# Patient Record
Sex: Male | Born: 1945
Health system: Southern US, Community
[De-identification: ages and names within clinical notes are randomized; demographics above are authoritative.]

## PROBLEM LIST (undated history)

## (undated) DIAGNOSIS — D509 Iron deficiency anemia, unspecified: Secondary | ICD-10-CM

## (undated) DIAGNOSIS — I77810 Thoracic aortic ectasia: Secondary | ICD-10-CM

## (undated) DIAGNOSIS — I351 Nonrheumatic aortic (valve) insufficiency: Secondary | ICD-10-CM

## (undated) DIAGNOSIS — F319 Bipolar disorder, unspecified: Secondary | ICD-10-CM

## (undated) DIAGNOSIS — Q2381 Bicuspid aortic valve: Secondary | ICD-10-CM

## (undated) DIAGNOSIS — I639 Cerebral infarction, unspecified: Secondary | ICD-10-CM

## (undated) DIAGNOSIS — Z9289 Personal history of other medical treatment: Secondary | ICD-10-CM

## (undated) DIAGNOSIS — Q231 Congenital insufficiency of aortic valve: Secondary | ICD-10-CM

## (undated) DIAGNOSIS — C801 Malignant (primary) neoplasm, unspecified: Secondary | ICD-10-CM

## (undated) DIAGNOSIS — I10 Essential (primary) hypertension: Secondary | ICD-10-CM

## (undated) DIAGNOSIS — F32A Depression, unspecified: Secondary | ICD-10-CM

## (undated) DIAGNOSIS — K5732 Diverticulitis of large intestine without perforation or abscess without bleeding: Secondary | ICD-10-CM

## (undated) DIAGNOSIS — M1990 Unspecified osteoarthritis, unspecified site: Secondary | ICD-10-CM

## (undated) DIAGNOSIS — E785 Hyperlipidemia, unspecified: Secondary | ICD-10-CM

## (undated) DIAGNOSIS — K219 Gastro-esophageal reflux disease without esophagitis: Secondary | ICD-10-CM

## (undated) DIAGNOSIS — I77819 Aortic ectasia, unspecified site: Secondary | ICD-10-CM

## (undated) HISTORY — DX: Essential (primary) hypertension: I10

## (undated) HISTORY — DX: Congenital insufficiency of aortic valve: Q23.1

## (undated) HISTORY — DX: Iron deficiency anemia, unspecified: D50.9

## (undated) HISTORY — DX: Personal history of other medical treatment: Z92.89

## (undated) HISTORY — DX: Gastro-esophageal reflux disease without esophagitis: K21.9

## (undated) HISTORY — DX: Diverticulitis of large intestine without perforation or abscess without bleeding: K57.32

## (undated) HISTORY — DX: Thoracic aortic ectasia: I77.810

## (undated) HISTORY — DX: Hyperlipidemia, unspecified: E78.5

## (undated) HISTORY — DX: Nonrheumatic aortic (valve) insufficiency: I35.1

## (undated) HISTORY — DX: Malignant (primary) neoplasm, unspecified: C80.1

## (undated) HISTORY — DX: Bipolar disorder, unspecified: F31.9

## (undated) HISTORY — DX: Cerebral infarction, unspecified: I63.9

## (undated) HISTORY — DX: Aortic ectasia, unspecified site: I77.819

## (undated) HISTORY — PX: HERNIA REPAIR: SHX51

## (undated) HISTORY — DX: Bicuspid aortic valve: Q23.81

## (undated) HISTORY — PX: ORTHOPEDIC SURGERY: SHX850

## (undated) HISTORY — DX: Depression, unspecified: F32.A

## (undated) HISTORY — PX: EYE SURGERY: SHX253

---

## 2004-11-20 ENCOUNTER — Ambulatory Visit: Payer: Self-pay

## 2005-03-26 ENCOUNTER — Ambulatory Visit: Payer: Self-pay | Admitting: Ophthalmology

## 2005-05-02 ENCOUNTER — Other Ambulatory Visit: Payer: Self-pay

## 2005-05-03 ENCOUNTER — Observation Stay: Payer: Self-pay

## 2005-05-03 ENCOUNTER — Inpatient Hospital Stay: Payer: Self-pay | Admitting: Unknown Physician Specialty

## 2005-09-11 ENCOUNTER — Ambulatory Visit: Payer: Self-pay

## 2006-05-03 ENCOUNTER — Emergency Department: Payer: Self-pay | Admitting: Emergency Medicine

## 2010-04-09 ENCOUNTER — Ambulatory Visit: Payer: Self-pay | Admitting: Ophthalmology

## 2011-07-09 ENCOUNTER — Ambulatory Visit: Payer: Self-pay | Admitting: Nephrology

## 2012-06-09 ENCOUNTER — Ambulatory Visit: Payer: Self-pay | Admitting: Gastroenterology

## 2012-06-09 LAB — HM COLONOSCOPY

## 2012-06-11 LAB — PATHOLOGY REPORT

## 2015-03-15 DIAGNOSIS — N183 Chronic kidney disease, stage 3 (moderate): Secondary | ICD-10-CM | POA: Diagnosis not present

## 2015-03-15 DIAGNOSIS — Z0000 Encounter for general adult medical examination without abnormal findings: Secondary | ICD-10-CM | POA: Diagnosis not present

## 2015-03-15 DIAGNOSIS — I10 Essential (primary) hypertension: Secondary | ICD-10-CM | POA: Diagnosis not present

## 2015-03-15 DIAGNOSIS — Z23 Encounter for immunization: Secondary | ICD-10-CM | POA: Diagnosis not present

## 2015-03-15 DIAGNOSIS — K5730 Diverticulosis of large intestine without perforation or abscess without bleeding: Secondary | ICD-10-CM | POA: Diagnosis not present

## 2015-03-15 DIAGNOSIS — K219 Gastro-esophageal reflux disease without esophagitis: Secondary | ICD-10-CM | POA: Diagnosis not present

## 2015-03-15 DIAGNOSIS — E782 Mixed hyperlipidemia: Secondary | ICD-10-CM | POA: Diagnosis not present

## 2015-03-15 LAB — PSA

## 2015-09-27 ENCOUNTER — Ambulatory Visit (INDEPENDENT_AMBULATORY_CARE_PROVIDER_SITE_OTHER): Payer: Medicare Other | Admitting: Family Medicine

## 2015-09-27 ENCOUNTER — Encounter: Payer: Self-pay | Admitting: Family Medicine

## 2015-09-27 VITALS — BP 121/77 | HR 69 | Temp 98.6°F | Ht 67.5 in | Wt 175.4 lb

## 2015-09-27 DIAGNOSIS — E785 Hyperlipidemia, unspecified: Secondary | ICD-10-CM | POA: Diagnosis not present

## 2015-09-27 DIAGNOSIS — F3170 Bipolar disorder, currently in remission, most recent episode unspecified: Secondary | ICD-10-CM | POA: Diagnosis not present

## 2015-09-27 DIAGNOSIS — F3181 Bipolar II disorder: Secondary | ICD-10-CM | POA: Insufficient documentation

## 2015-09-27 DIAGNOSIS — I129 Hypertensive chronic kidney disease with stage 1 through stage 4 chronic kidney disease, or unspecified chronic kidney disease: Secondary | ICD-10-CM | POA: Insufficient documentation

## 2015-09-27 DIAGNOSIS — I10 Essential (primary) hypertension: Secondary | ICD-10-CM | POA: Diagnosis not present

## 2015-09-27 DIAGNOSIS — N183 Chronic kidney disease, stage 3 unspecified: Secondary | ICD-10-CM

## 2015-09-27 DIAGNOSIS — Z23 Encounter for immunization: Secondary | ICD-10-CM | POA: Diagnosis not present

## 2015-09-27 DIAGNOSIS — I639 Cerebral infarction, unspecified: Secondary | ICD-10-CM | POA: Insufficient documentation

## 2015-09-27 DIAGNOSIS — Z8673 Personal history of transient ischemic attack (TIA), and cerebral infarction without residual deficits: Secondary | ICD-10-CM | POA: Insufficient documentation

## 2015-09-27 DIAGNOSIS — F319 Bipolar disorder, unspecified: Secondary | ICD-10-CM | POA: Insufficient documentation

## 2015-09-27 MED ORDER — RISPERIDONE 2 MG PO TABS: 2.0000 mg | ORAL_TABLET | Freq: Every day | ORAL | Status: DC

## 2015-09-27 MED ORDER — CHOLINE FENOFIBRATE 135 MG PO CPDR: 135.0000 mg | DELAYED_RELEASE_CAPSULE | Freq: Every day | ORAL | Status: DC

## 2015-09-27 MED ORDER — LISINOPRIL 20 MG PO TABS: 20.0000 mg | ORAL_TABLET | Freq: Every day | ORAL | Status: DC

## 2015-09-27 MED ORDER — DULOXETINE HCL 30 MG PO CPEP: 30.0000 mg | ORAL_CAPSULE | Freq: Every day | ORAL | Status: DC

## 2015-09-27 MED ORDER — BUPROPION HCL ER (XL) 300 MG PO TB24: 300.0000 mg | ORAL_TABLET | Freq: Every day | ORAL | Status: DC

## 2015-09-27 MED ORDER — DIVALPROEX SODIUM ER 250 MG PO TB24: 250.0000 mg | ORAL_TABLET | Freq: Two times a day (BID) | ORAL | Status: DC

## 2015-09-27 NOTE — Progress Notes (Signed)
BP 121/77 mmHg  Pulse 69  Temp(Src) 98.6 F (37 C)  Ht 5' 7.5" (1.715 m)  Wt 175 lb 6.4 oz (79.561 kg)  BMI 27.05 kg/m2  SpO2 95%   Subjective:    Patient ID: Dean Larsen, male    DOB: 08/21/1946, 69 y.o.   MRN: [id number]  HPI: Dean Larsen is a 69 y.o. male  Chief Complaint  Patient presents with  . Follow-up   patient accompanied by daughter All in all doing well no complaints from medication which takes faithfully Blood pressure well controlled Stroke no further symptoms Bipolar stable on medications with no change in symptoms over the last 6 months Cholesterol triglycerides doing well no complaints from medications CK D stable not taking kidney toxic agents.  Relevant past medical, surgical, family and social history reviewed and updated as indicated. Interim medical history since our last visit reviewed. Allergies and medications reviewed and updated.  Review of Systems  Constitutional: Negative.   Respiratory: Negative.   Cardiovascular: Negative.     Per HPI unless specifically indicated above     Objective:    BP 121/77 mmHg  Pulse 69  Temp(Src) 98.6 F (37 C)  Ht 5' 7.5" (1.715 m)  Wt 175 lb 6.4 oz (79.561 kg)  BMI 27.05 kg/m2  SpO2 95%  Wt Readings from Last 3 Encounters:  09/27/15 175 lb 6.4 oz (79.561 kg)  03/15/15 176 lb (79.833 kg)    Physical Exam  Constitutional: He is oriented to person, place, and time. He appears well-developed and well-nourished. No distress.  HENT:  Head: Normocephalic and atraumatic.  Right Ear: Hearing normal.  Left Ear: Hearing normal.  Nose: Nose normal.  Eyes: Conjunctivae and lids are normal. Right eye exhibits no discharge. Left eye exhibits no discharge. No scleral icterus.  Cardiovascular: Normal rate, regular rhythm and normal heart sounds.   Pulmonary/Chest: Effort normal and breath sounds normal. No respiratory distress.  Musculoskeletal: Normal range of motion.  Neurological: He is alert and  oriented to person, place, and time.  Skin: Skin is intact. No rash noted.  Psychiatric: He has a normal mood and affect. His speech is normal and behavior is normal. Judgment and thought content normal. Cognition and memory are normal.    Results for orders placed or performed in visit on 09/25/15  PSA  Result Value Ref Range   PSA PP   HM COLONOSCOPY  Result Value Ref Range   HM Colonoscopy PP       Assessment & Plan:   Problem List Items Addressed This Visit      Cardiovascular and Mediastinum   Essential hypertension   Relevant Medications   lisinopril (PRINIVIL,ZESTRIL) 20 MG tablet   Choline Fenofibrate (TRILIPIX) 135 MG capsule   CVA (cerebral vascular accident)   Relevant Medications   lisinopril (PRINIVIL,ZESTRIL) 20 MG tablet   Choline Fenofibrate (TRILIPIX) 135 MG capsule     Genitourinary   Hypertensive kidney disease with CKD stage III   Relevant Medications   lisinopril (PRINIVIL,ZESTRIL) 20 MG tablet   Other Relevant Orders   Basic metabolic panel     Other   Bipolar disorder - Primary   Relevant Medications   risperiDONE (RISPERDAL) 2 MG tablet   DULoxetine (CYMBALTA) 30 MG capsule   divalproex (DEPAKOTE ER) 250 MG 24 hr tablet   buPROPion (WELLBUTRIN XL) 300 MG 24 hr tablet   Hyperlipidemia    The current medical regimen is effective;  continue present plan and medications.  Relevant Medications   lisinopril (PRINIVIL,ZESTRIL) 20 MG tablet   Choline Fenofibrate (TRILIPIX) 135 MG capsule   Other Relevant Orders   Lipid Panel w/o Chol/HDL Ratio   ALT   AST    Other Visit Diagnoses    Need for influenza vaccination        Relevant Orders    Flu Vaccine QUAD 36+ mos PF IM (Fluarix & Fluzone Quad PF) (Completed)        Follow up plan: Return in about 6 months (around 03/26/2016), or if symptoms worsen or fail to improve, for Physical Exam.

## 2015-09-27 NOTE — Assessment & Plan Note (Signed)
The current medical regimen is effective;  continue present plan and medications.  

## 2015-09-28 LAB — BASIC METABOLIC PANEL
BUN / CREAT RATIO: 15 (ref 10–22)
BUN: 26 mg/dL (ref 8–27)
CALCIUM: 9.6 mg/dL (ref 8.6–10.2)
CO2: 22 mmol/L (ref 18–29)
Chloride: 101 mmol/L (ref 97–108)
Creatinine, Ser: 1.79 mg/dL — ABNORMAL HIGH (ref 0.76–1.27)
GFR, EST AFRICAN AMERICAN: 43 mL/min/{1.73_m2} — AB (ref >59)
GFR, EST NON AFRICAN AMERICAN: 38 mL/min/{1.73_m2} — AB (ref >59)
Glucose: 95 mg/dL (ref 65–99)
Potassium: 5.2 mmol/L (ref 3.5–5.2)
Sodium: 141 mmol/L (ref 134–144)

## 2015-09-28 LAB — LIPID PANEL W/O CHOL/HDL RATIO
CHOLESTEROL TOTAL: 224 mg/dL — AB (ref 100–199)
HDL: 42 mg/dL (ref >39)
LDL Calculated: 149 mg/dL — ABNORMAL HIGH (ref 0–99)
TRIGLYCERIDES: 167 mg/dL — AB (ref 0–149)
VLDL Cholesterol Cal: 33 mg/dL (ref 5–40)

## 2015-09-28 LAB — ALT: ALT: 36 IU/L (ref 0–44)

## 2015-09-28 LAB — AST: AST: 29 IU/L (ref 0–40)

## 2015-10-01 ENCOUNTER — Telehealth: Payer: Self-pay | Admitting: Family Medicine

## 2015-10-01 NOTE — Telephone Encounter (Signed)
Phone call discussed with patient's daughter is patient hard of hearing Patient with CK D and elevated cholesterol reviewed with daughter who knows patient not interested in doing further intervention We will try to do better diet avoid nephrotoxic agents.

## 2015-10-01 NOTE — Telephone Encounter (Signed)
-----   Message from Wynn Maudlin, [place] sent at 10/01/2015 12:19 PM EDT ----- Daughter Anderson Malta

## 2016-03-18 ENCOUNTER — Encounter: Payer: Medicare Other | Admitting: Family Medicine

## 2016-04-16 ENCOUNTER — Encounter: Payer: Self-pay | Admitting: Family Medicine

## 2016-04-16 ENCOUNTER — Ambulatory Visit (INDEPENDENT_AMBULATORY_CARE_PROVIDER_SITE_OTHER): Payer: Medicare Other | Admitting: Family Medicine

## 2016-04-16 VITALS — BP 129/64 | HR 61 | Temp 97.5°F | Ht 64.3 in | Wt 173.0 lb

## 2016-04-16 DIAGNOSIS — E785 Hyperlipidemia, unspecified: Secondary | ICD-10-CM

## 2016-04-16 DIAGNOSIS — K222 Esophageal obstruction: Secondary | ICD-10-CM | POA: Diagnosis not present

## 2016-04-16 DIAGNOSIS — I10 Essential (primary) hypertension: Secondary | ICD-10-CM

## 2016-04-16 DIAGNOSIS — Z0000 Encounter for general adult medical examination without abnormal findings: Secondary | ICD-10-CM

## 2016-04-16 DIAGNOSIS — F3170 Bipolar disorder, currently in remission, most recent episode unspecified: Secondary | ICD-10-CM

## 2016-04-16 DIAGNOSIS — I129 Hypertensive chronic kidney disease with stage 1 through stage 4 chronic kidney disease, or unspecified chronic kidney disease: Secondary | ICD-10-CM

## 2016-04-16 DIAGNOSIS — Z23 Encounter for immunization: Secondary | ICD-10-CM | POA: Diagnosis not present

## 2016-04-16 DIAGNOSIS — N183 Chronic kidney disease, stage 3 (moderate): Secondary | ICD-10-CM | POA: Diagnosis not present

## 2016-04-16 LAB — URINALYSIS, ROUTINE W REFLEX MICROSCOPIC
BILIRUBIN UA: NEGATIVE
GLUCOSE, UA: NEGATIVE
KETONES UA: NEGATIVE
Leukocytes, UA: NEGATIVE
Nitrite, UA: NEGATIVE
PROTEIN UA: NEGATIVE
RBC, UA: NEGATIVE
Specific Gravity, UA: 1.02 (ref 1.005–1.030)
UUROB: 1 mg/dL (ref 0.2–1.0)
pH, UA: 6 (ref 5.0–7.5)

## 2016-04-16 MED ORDER — DULOXETINE HCL 30 MG PO CPEP: 30.0000 mg | ORAL_CAPSULE | Freq: Every day | ORAL | Status: DC

## 2016-04-16 MED ORDER — BUPROPION HCL ER (XL) 300 MG PO TB24: 300.0000 mg | ORAL_TABLET | Freq: Every day | ORAL | Status: DC

## 2016-04-16 MED ORDER — LISINOPRIL 20 MG PO TABS
20.0000 mg | ORAL_TABLET | Freq: Every day | ORAL | Status: DC
Start: 2016-04-16 — End: 2017-04-06

## 2016-04-16 MED ORDER — CHOLINE FENOFIBRATE 135 MG PO CPDR: 135.0000 mg | DELAYED_RELEASE_CAPSULE | Freq: Every day | ORAL | Status: DC

## 2016-04-16 MED ORDER — RISPERIDONE 2 MG PO TABS: 2.0000 mg | ORAL_TABLET | Freq: Every day | ORAL | Status: DC

## 2016-04-16 MED ORDER — DIVALPROEX SODIUM ER 250 MG PO TB24: 250.0000 mg | ORAL_TABLET | Freq: Two times a day (BID) | ORAL | Status: DC

## 2016-04-16 NOTE — Progress Notes (Signed)
BP 129/64 mmHg  Pulse 61  Temp(Src) 97.5 F (36.4 C)  Ht 5' 4.3" (1.633 m)  Wt 173 lb (78.472 kg)  BMI 29.43 kg/m2  SpO2 99%   Subjective:    Patient ID: Dean Larsen, male    DOB: 01/20/1946, 70 y.o.   MRN: [id number]  HPI: Dean Larsen is a 70 y.o. male  Chief Complaint  Patient presents with  . Annual Exam   Patient all in all doing well accompanied by his daughter who assists with history nerves doing well on medications. No problems with sleeping Does have some headache that comes on responds well to Tylenol  Cholesterol medicine doing well with no side effects or issues Blood pressure good control   Relevant past medical, surgical, family and social history reviewed and updated as indicated. Interim medical history since our last visit reviewed. Allergies and medications reviewed and updated.  Review of Systems  Constitutional: Negative.   HENT: Negative.   Eyes: Negative.   Respiratory: Negative.   Cardiovascular: Negative.   Gastrointestinal: Negative.        Patient has esophageal stenosis problems has been to GI for this having exacerbation of symptoms will need to go back.  Endocrine: Negative.   Genitourinary: Negative.   Musculoskeletal: Negative.   Skin: Negative.   Allergic/Immunologic: Negative.   Neurological: Negative.   Hematological: Negative.   Psychiatric/Behavioral: Negative.     Per HPI unless specifically indicated above     Objective:    BP 129/64 mmHg  Pulse 61  Temp(Src) 97.5 F (36.4 C)  Ht 5' 4.3" (1.633 m)  Wt 173 lb (78.472 kg)  BMI 29.43 kg/m2  SpO2 99%  Wt Readings from Last 3 Encounters:  04/16/16 173 lb (78.472 kg)  09/27/15 175 lb 6.4 oz (79.561 kg)  03/15/15 176 lb (79.833 kg)    Physical Exam  Constitutional: He is oriented to person, place, and time. He appears well-developed and well-nourished.  HENT:  Head: Normocephalic and atraumatic.  Right Ear: External ear normal.  Left Ear: External ear  normal.  Eyes: Conjunctivae and EOM are normal. Pupils are equal, round, and reactive to light.  Neck: Normal range of motion. Neck supple.  Cardiovascular: Normal rate, regular rhythm, normal heart sounds and intact distal pulses.   Pulmonary/Chest: Effort normal and breath sounds normal.  Abdominal: Soft. Bowel sounds are normal. There is no splenomegaly or hepatomegaly.  Genitourinary: Rectum normal, prostate normal and penis normal.  Musculoskeletal: Normal range of motion.  Neurological: He is alert and oriented to person, place, and time. He has normal reflexes.  Skin: No rash noted. No erythema.  Psychiatric: He has a normal mood and affect. His behavior is normal. Judgment and thought content normal.    Results for orders placed or performed in visit on 09/27/15  Lipid Panel w/o Chol/HDL Ratio  Result Value Ref Range   Cholesterol, Total 224 (H) 100 - 199 mg/dL   Triglycerides 167 (H) 0 - 149 mg/dL   HDL 42 >39 mg/dL   VLDL Cholesterol Cal 33 5 - 40 mg/dL   LDL Calculated 149 (H) 0 - 99 mg/dL  Basic metabolic panel  Result Value Ref Range   Glucose 95 65 - 99 mg/dL   BUN 26 8 - 27 mg/dL   Creatinine, Ser 1.79 (H) 0.76 - 1.27 mg/dL   GFR calc non Af Amer 38 (L) >59 mL/min/1.73   GFR calc Af Amer 43 (L) >59 mL/min/1.73   BUN/Creatinine Ratio  15 10 - 22   Sodium 141 134 - 144 mmol/L   Potassium 5.2 3.5 - 5.2 mmol/L   Chloride 101 97 - 108 mmol/L   CO2 22 18 - 29 mmol/L   Calcium 9.6 8.6 - 10.2 mg/dL  ALT  Result Value Ref Range   ALT 36 0 - 44 IU/L  AST  Result Value Ref Range   AST 29 0 - 40 IU/L      Assessment & Plan:   Problem List Items Addressed This Visit      Cardiovascular and Mediastinum   Essential hypertension    The current medical regimen is effective;  continue present plan and medications.       Relevant Medications   Choline Fenofibrate (TRILIPIX) 135 MG capsule   lisinopril (PRINIVIL,ZESTRIL) 20 MG tablet   Other Relevant Orders    Comprehensive metabolic panel   CBC with Differential/Platelet   TSH   Urinalysis, Routine w reflex microscopic (not at [facility])     Digestive   Esophageal stenosis    Has had esophageal dilation in the past symptoms gradually getting worse will need GI referral      Relevant Orders   Ambulatory referral to Gastroenterology   CBC with Differential/Platelet   TSH     Genitourinary   Hypertensive kidney disease with CKD stage III   Relevant Medications   lisinopril (PRINIVIL,ZESTRIL) 20 MG tablet   Other Relevant Orders   PSA     Other   Bipolar disorder ([place])    The current medical regimen is effective;  continue present plan and medications.       Relevant Medications   buPROPion (WELLBUTRIN XL) 300 MG 24 hr tablet   divalproex (DEPAKOTE ER) 250 MG 24 hr tablet   DULoxetine (CYMBALTA) 30 MG capsule   risperiDONE (RISPERDAL) 2 MG tablet   Other Relevant Orders   TSH   Hyperlipidemia    The current medical regimen is effective;  continue present plan and medications.       Relevant Medications   Choline Fenofibrate (TRILIPIX) 135 MG capsule   lisinopril (PRINIVIL,ZESTRIL) 20 MG tablet   Other Relevant Orders   Comprehensive metabolic panel   Lipid panel   CBC with Differential/Platelet   TSH    Other Visit Diagnoses    Health care maintenance    -  Primary    Relevant Orders    Hepatitis C Antibody    Immunization due        Relevant Orders    Pneumococcal polysaccharide vaccine 23-valent greater than or equal to 2yo subcutaneous/IM    PE (physical exam), annual        Relevant Orders    PSA        Follow up plan: Return in about 6 months (around 10/16/2016) for BMP, lipids, alt, ast.

## 2016-04-16 NOTE — Assessment & Plan Note (Signed)
The current medical regimen is effective;  continue present plan and medications.  

## 2016-04-16 NOTE — Patient Instructions (Signed)
Pneumococcal Polysaccharide Vaccine: What You Need to Know 1. Why get vaccinated? Vaccination can protect older adults (and some children and younger adults) from pneumococcal disease. Pneumococcal disease is caused by bacteria that can spread from person to person through close contact. It can cause ear infections, and it can also lead to more serious infections of the:   Lungs (pneumonia),  Blood (bacteremia), and  Covering of the brain and spinal cord (meningitis). Meningitis can cause deafness and brain damage, and it can be fatal. Anyone can get pneumococcal disease, but children under 62 years of age, people with certain medical conditions, adults over 68 years of age, and cigarette smokers are at the highest risk. About 18,000 older adults die each year from pneumococcal disease in the [place]. Treatment of pneumococcal infections with penicillin and other drugs used to be more effective. But some strains of the disease have become resistant to these drugs. This makes prevention of the disease, through vaccination, even more important. 2. Pneumococcal polysaccharide vaccine (PPSV23) Pneumococcal polysaccharide vaccine (PPSV23) protects against 23 types of pneumococcal bacteria. It will not prevent all pneumococcal disease. PPSV23 is recommended for:  All adults 6 years of age and older,  Anyone 2 through 71 years of age with certain long-term health problems,  Anyone 2 through 71 years of age with a weakened immune system,  Adults 64 through 71 years of age who smoke cigarettes or have asthma. Most people need only one dose of PPSV. A second dose is recommended for certain high-risk groups. People 53 and older should get a dose even if they have gotten one or more doses of the vaccine before they turned 65. Your healthcare provider can give you more information about these recommendations. Most healthy adults develop protection within 2 to 3 weeks of getting the shot. 3. Some  people should not get this vaccine  Anyone who has had a life-threatening allergic reaction to PPSV should not get another dose.  Anyone who has a severe allergy to any component of PPSV should not receive it. Tell your provider if you have any severe allergies.  Anyone who is moderately or severely ill when the shot is scheduled may be asked to wait until they recover before getting the vaccine. Someone with a mild illness can usually be vaccinated.  Children less than 83 years of age should not receive this vaccine.  There is no evidence that PPSV is harmful to either a pregnant woman or to her fetus. However, as a precaution, women who need the vaccine should be vaccinated before becoming pregnant, if possible. 4. Risks of a vaccine reaction With any medicine, including vaccines, there is a chance of side effects. These are usually mild and go away on their own, but serious reactions are also possible. About half of people who get PPSV have mild side effects, such as redness or pain where the shot is given, which go away within about two days. Less than 1 out of 100 people develop a fever, muscle aches, or more severe local reactions. Problems that could happen after any vaccine:  People sometimes faint after a medical procedure, including vaccination. Sitting or lying down for about 15 minutes can help prevent fainting, and injuries caused by a fall. Tell your doctor if you feel dizzy, or have vision changes or ringing in the ears.  Some people get severe pain in the shoulder and have difficulty moving the arm where a shot was given. This happens very rarely.  Any medication  can cause a severe allergic reaction. Such reactions from a vaccine are very rare, estimated at about 1 in a million doses, and would happen within a few minutes to a few hours after the vaccination. As with any medicine, there is a very remote chance of a vaccine causing a serious injury or death. The safety of  vaccines is always being monitored. For more information, visit: http://www.aguilar.org/ 5. What if there is a serious reaction? What should I look for? Look for anything that concerns you, such as signs of a severe allergic reaction, very high fever, or unusual behavior.  Signs of a severe allergic reaction can include hives, swelling of the face and throat, difficulty breathing, a fast heartbeat, dizziness, and weakness. These would usually start a few minutes to a few hours after the vaccination. What should I do? If you think it is a severe allergic reaction or other emergency that can't wait, call 9-1-1 or get to the nearest hospital. Otherwise, call your doctor. Afterward, the reaction should be reported to the Vaccine Adverse Event Reporting System (VAERS). Your doctor might file this report, or you can do it yourself through the VAERS web site at www.vaers.SamedayNews.es, or by calling 325-014-4947.  VAERS does not give medical advice. 6. How can I learn more?  Ask your doctor. He or she can give you the vaccine package insert or suggest other sources of information.  Call your local or state health department.  Contact the Centers for Disease Control and Prevention (CDC):  Call (279)564-3382 (1-800-CDC-INFO) or  Visit CDC's website at http://hunter.com/ CDC Pneumococcal Polysaccharide Vaccine VIS (04/21/14)   This information is not intended to replace advice given to you by your health care provider. Make sure you discuss any questions you have with your health care provider.   Document Released: 10/12/2006 Document Revised: 01/05/2015 Document Reviewed: 04/24/2014 Elsevier Interactive Patient Education 2016 Else[employer]. Pneumococcal Polysaccharide Vaccine: What You Need to Know 1. Why get vaccinated? Vaccination can protect older adults (and some children and younger adults) from pneumococcal disease. Pneumococcal disease is caused by bacteria that can spread from person to  person through close contact. It can cause ear infections, and it can also lead to more serious infections of the:   Lungs (pneumonia),  Blood (bacteremia), and  Covering of the brain and spinal cord (meningitis). Meningitis can cause deafness and brain damage, and it can be fatal. Anyone can get pneumococcal disease, but children under 5 years of age, people with certain medical conditions, adults over 60 years of age, and cigarette smokers are at the highest risk. About 18,000 older adults die each year from pneumococcal disease in the [place]. Treatment of pneumococcal infections with penicillin and other drugs used to be more effective. But some strains of the disease have become resistant to these drugs. This makes prevention of the disease, through vaccination, even more important. 2. Pneumococcal polysaccharide vaccine (PPSV23) Pneumococcal polysaccharide vaccine (PPSV23) protects against 23 types of pneumococcal bacteria. It will not prevent all pneumococcal disease. PPSV23 is recommended for:  All adults 61 years of age and older,  Anyone 2 through 71 years of age with certain long-term health problems,  Anyone 2 through 71 years of age with a weakened immune system,  Adults 46 through 71 years of age who smoke cigarettes or have asthma. Most people need only one dose of PPSV. A second dose is recommended for certain high-risk groups. People 48 and older should get a dose even if they have  gotten one or more doses of the vaccine before they turned 65. Your healthcare provider can give you more information about these recommendations. Most healthy adults develop protection within 2 to 3 weeks of getting the shot. 3. Some people should not get this vaccine  Anyone who has had a life-threatening allergic reaction to PPSV should not get another dose.  Anyone who has a severe allergy to any component of PPSV should not receive it. Tell your provider if you have any severe  allergies.  Anyone who is moderately or severely ill when the shot is scheduled may be asked to wait until they recover before getting the vaccine. Someone with a mild illness can usually be vaccinated.  Children less than 89 years of age should not receive this vaccine.  There is no evidence that PPSV is harmful to either a pregnant woman or to her fetus. However, as a precaution, women who need the vaccine should be vaccinated before becoming pregnant, if possible. 4. Risks of a vaccine reaction With any medicine, including vaccines, there is a chance of side effects. These are usually mild and go away on their own, but serious reactions are also possible. About half of people who get PPSV have mild side effects, such as redness or pain where the shot is given, which go away within about two days. Less than 1 out of 100 people develop a fever, muscle aches, or more severe local reactions. Problems that could happen after any vaccine:  People sometimes faint after a medical procedure, including vaccination. Sitting or lying down for about 15 minutes can help prevent fainting, and injuries caused by a fall. Tell your doctor if you feel dizzy, or have vision changes or ringing in the ears.  Some people get severe pain in the shoulder and have difficulty moving the arm where a shot was given. This happens very rarely.  Any medication can cause a severe allergic reaction. Such reactions from a vaccine are very rare, estimated at about 1 in a million doses, and would happen within a few minutes to a few hours after the vaccination. As with any medicine, there is a very remote chance of a vaccine causing a serious injury or death. The safety of vaccines is always being monitored. For more information, visit: http://www.aguilar.org/ 5. What if there is a serious reaction? What should I look for? Look for anything that concerns you, such as signs of a severe allergic reaction, very high fever, or  unusual behavior.  Signs of a severe allergic reaction can include hives, swelling of the face and throat, difficulty breathing, a fast heartbeat, dizziness, and weakness. These would usually start a few minutes to a few hours after the vaccination. What should I do? If you think it is a severe allergic reaction or other emergency that can't wait, call 9-1-1 or get to the nearest hospital. Otherwise, call your doctor. Afterward, the reaction should be reported to the Vaccine Adverse Event Reporting System (VAERS). Your doctor might file this report, or you can do it yourself through the VAERS web site at www.vaers.SamedayNews.es, or by calling 984-819-3808.  VAERS does not give medical advice. 6. How can I learn more?  Ask your doctor. He or she can give you the vaccine package insert or suggest other sources of information.  Call your local or state health department.  Contact the Centers for Disease Control and Prevention (CDC):  Call 610-627-7799 (1-800-CDC-INFO) or  Visit CDC's website at http://hunter.com/ CDC Pneumococcal Polysaccharide Vaccine  VIS (04/21/14)   This information is not intended to replace advice given to you by your health care provider. Make sure you discuss any questions you have with your health care provider.   Document Released: 10/12/2006 Document Revised: 01/05/2015 Document Reviewed: 04/24/2014 Elsevier Interactive Patient Education [employer].

## 2016-04-16 NOTE — Assessment & Plan Note (Signed)
Has had esophageal dilation in the past symptoms gradually getting worse will need GI referral

## 2016-04-17 ENCOUNTER — Telehealth: Payer: Self-pay | Admitting: Family Medicine

## 2016-04-17 DIAGNOSIS — D649 Anemia, unspecified: Secondary | ICD-10-CM

## 2016-04-17 DIAGNOSIS — R972 Elevated prostate specific antigen [PSA]: Secondary | ICD-10-CM

## 2016-04-17 LAB — PSA: PROSTATE SPECIFIC AG, SERUM: 4.1 ng/mL — AB (ref 0.0–4.0)

## 2016-04-17 LAB — LIPID PANEL
Chol/HDL Ratio: 4.8 ratio units (ref 0.0–5.0)
Cholesterol, Total: 209 mg/dL — ABNORMAL HIGH (ref 100–199)
HDL: 44 mg/dL (ref >39)
LDL Calculated: 126 mg/dL — ABNORMAL HIGH (ref 0–99)
Triglycerides: 193 mg/dL — ABNORMAL HIGH (ref 0–149)
VLDL Cholesterol Cal: 39 mg/dL (ref 5–40)

## 2016-04-17 LAB — CBC WITH DIFFERENTIAL/PLATELET
Basophils Absolute: 0 10*3/uL (ref 0.0–0.2)
Basos: 1 %
EOS (ABSOLUTE): 0.2 10*3/uL (ref 0.0–0.4)
EOS: 3 %
HEMATOCRIT: 37.8 % (ref 37.5–51.0)
HEMOGLOBIN: 12.5 g/dL — AB (ref 12.6–17.7)
IMMATURE GRANULOCYTES: 0 %
Immature Grans (Abs): 0 10*3/uL (ref 0.0–0.1)
Lymphocytes Absolute: 1.7 10*3/uL (ref 0.7–3.1)
Lymphs: 32 %
MCH: 29.8 pg (ref 26.6–33.0)
MCHC: 33.1 g/dL (ref 31.5–35.7)
MCV: 90 fL (ref 79–97)
MONOCYTES: 10 %
MONOS ABS: 0.5 10*3/uL (ref 0.1–0.9)
NEUTROS PCT: 54 %
Neutrophils Absolute: 2.7 10*3/uL (ref 1.4–7.0)
Platelets: 206 10*3/uL (ref 150–379)
RBC: 4.19 x10E6/uL (ref 4.14–5.80)
RDW: 13.9 % (ref 12.3–15.4)
WBC: 5.1 10*3/uL (ref 3.4–10.8)

## 2016-04-17 LAB — COMPREHENSIVE METABOLIC PANEL
A/G RATIO: 1.8 (ref 1.2–2.2)
ALBUMIN: 4.4 g/dL (ref 3.5–4.8)
ALT: 21 IU/L (ref 0–44)
AST: 18 IU/L (ref 0–40)
Alkaline Phosphatase: 18 IU/L — ABNORMAL LOW (ref 39–117)
BILIRUBIN TOTAL: 0.6 mg/dL (ref 0.0–1.2)
BUN / CREAT RATIO: 11 (ref 10–24)
BUN: 17 mg/dL (ref 8–27)
CHLORIDE: 101 mmol/L (ref 96–106)
CO2: 26 mmol/L (ref 18–29)
Calcium: 9.2 mg/dL (ref 8.6–10.2)
Creatinine, Ser: 1.5 mg/dL — ABNORMAL HIGH (ref 0.76–1.27)
GFR calc non Af Amer: 46 mL/min/{1.73_m2} — ABNORMAL LOW (ref >59)
GFR, EST AFRICAN AMERICAN: 54 mL/min/{1.73_m2} — AB (ref >59)
Globulin, Total: 2.5 g/dL (ref 1.5–4.5)
Glucose: 77 mg/dL (ref 65–99)
POTASSIUM: 5 mmol/L (ref 3.5–5.2)
Sodium: 144 mmol/L (ref 134–144)
TOTAL PROTEIN: 6.9 g/dL (ref 6.0–8.5)

## 2016-04-17 LAB — HEPATITIS C ANTIBODY: Hep C Virus Ab: <0.1 s/co ratio (ref 0.0–0.9)

## 2016-04-17 LAB — TSH: TSH: 1.94 u[IU]/mL (ref 0.450–4.500)

## 2016-04-17 NOTE — Telephone Encounter (Signed)
Phone call Discussed with patient's daughter blood work report all in all improved from a year ago except PSA slightly up will recheck CBC in 6 months along with PSA

## 2016-04-17 NOTE — Telephone Encounter (Signed)
-----   Message from Wynn Maudlin, [place] sent at 04/17/2016 11:55 AM EDT ----- Daughter Anderson Malta

## 2016-05-12 ENCOUNTER — Encounter (INDEPENDENT_AMBULATORY_CARE_PROVIDER_SITE_OTHER): Payer: Self-pay

## 2016-05-12 ENCOUNTER — Other Ambulatory Visit: Payer: Self-pay

## 2016-05-12 ENCOUNTER — Encounter: Payer: Self-pay | Admitting: Gastroenterology

## 2016-05-12 ENCOUNTER — Ambulatory Visit (INDEPENDENT_AMBULATORY_CARE_PROVIDER_SITE_OTHER): Payer: Medicare Other | Admitting: Gastroenterology

## 2016-05-12 VITALS — BP 127/68 | HR 68 | Temp 98.5°F | Ht 63.0 in | Wt 175.0 lb

## 2016-05-12 DIAGNOSIS — R1310 Dysphagia, unspecified: Secondary | ICD-10-CM | POA: Diagnosis not present

## 2016-05-13 ENCOUNTER — Other Ambulatory Visit: Payer: Self-pay

## 2016-05-13 NOTE — Progress Notes (Signed)
Gastroenterology Consultation  Referring Provider:     Guadalupe Maple, MD Primary Care Physician:  Golden Pop, MD Primary Gastroenterologist:  Dr. Allen Norris     Reason for Consultation:     Dysphagia        HPI:   Dean Larsen is a 70 y.o. y/o male referred for consultation & management of Dysphagia by Dr. Golden Pop, MD.  This patient comes in today with a history of dysphagia. The patient states that he is having problems with both liquids and solids. He also reports that he is unable to swallow pills without throwing his head back violently to get the pills down. The patient states he has had his multiple times in the past with the patient undergoing esophageal dilation. The patient reports that he presents with the same symptoms whenever he has problems swallowing. The problems usually go away after dilation and he is good for a number of years. There is no report of any unexplained weight loss black stools bloody stools nausea or vomiting. The patient does not smoke but he chews tobacco and he has not drank in many years.  Past Medical History  Diagnosis Date  . Bipolar disorder ([place])   . Hyperlipidemia   . GERD (gastroesophageal reflux disease)   . Diverticulitis of colon   . Iron deficiency anemia   . CVA (cerebral vascular accident) [facility])     Age 70    Past Surgical History  Procedure Laterality Date  . Hernia repair    . Orthopedic surgery      Surgery for broken leg and toe    Prior to Admission medications   Medication Sig Start Date End Date Taking? Authorizing Provider  aspirin 81 MG tablet Take 81 mg by mouth daily.   Yes Historical Provider, MD  buPROPion (WELLBUTRIN XL) 300 MG 24 hr tablet Take 1 tablet (300 mg total) by mouth daily. 04/16/16  Yes Guadalupe Maple, MD  Choline Fenofibrate (TRILIPIX) 135 MG capsule Take 1 capsule (135 mg total) by mouth daily. 04/16/16  Yes Guadalupe Maple, MD  divalproex (DEPAKOTE ER) 250 MG 24 hr tablet Take 1 tablet (250 mg  total) by mouth 2 (two) times daily. 04/16/16  Yes Guadalupe Maple, MD  DULoxetine (CYMBALTA) 30 MG capsule Take 1 capsule (30 mg total) by mouth daily. 04/16/16  Yes Guadalupe Maple, MD  lisinopril (PRINIVIL,ZESTRIL) 20 MG tablet Take 1 tablet (20 mg total) by mouth daily. 04/16/16  Yes Guadalupe Maple, MD  risperiDONE (RISPERDAL) 2 MG tablet Take 1 tablet (2 mg total) by mouth daily. 04/16/16  Yes Guadalupe Maple, MD    Family History  Problem Relation Age of Onset  . Stroke Father   . Diabetes Brother      Social History  Substance Use Topics  . Smoking status: Former Smoker    Quit date: 12/29/1978  . Smokeless tobacco: Current User    Types: Snuff  . Alcohol Use: No    Allergies as of 05/12/2016 - Review Complete 05/12/2016  Allergen Reaction Noted  . Codeine  09/25/2015  . Percocet [oxycodone-acetaminophen]  09/25/2015  . Penicillin g benzathine Rash 09/25/2015    Review of Systems:    All systems reviewed and negative except where noted in HPI.   Physical Exam:  BP 127/68 mmHg  Pulse 68  Temp(Src) 98.5 F (36.9 C) (Oral)  Ht 5\' 3"  (1.6 m)  Wt 175 lb (79.379 kg)  BMI 31.01 kg/m2  No LMP for male patient. Psych:  Alert and cooperative. Normal mood and affect. General:   Alert,  Well-developed, well-nourished, pleasant and cooperative in NAD Head:  Normocephalic and atraumatic. Eyes:  Sclera clear, no icterus.   Conjunctiva pink. Ears:  Normal auditory acuity. Nose:  No deformity, discharge, or lesions. Mouth:  No deformity or lesions,oropharynx pink & moist. Neck:  Supple; no masses or thyromegaly. Lungs:  Respirations even and unlabored.  Clear throughout to auscultation.   No wheezes, crackles, or rhonchi. No acute distress. Heart:  Regular rate and rhythm; no murmurs, clicks, rubs, or gallops. Abdomen:  Normal bowel sounds.  No bruits.  Soft, non-tender and non-distended without masses, hepatosplenomegaly or hernias noted.  No guarding or rebound tenderness.   Negative Carnett sign.   Rectal:  Deferred.  Msk:  Symmetrical without gross deformities.  Good, equal movement & strength bilaterally. Pulses:  Normal pulses noted. Extremities:  No clubbing or edema.  No cyanosis. Neurologic:  Alert and oriented x3;  grossly normal neurologically. Skin:  Intact without significant lesions or rashes.  No jaundice. Lymph Nodes:  No significant cervical adenopathy. Psych:  Alert and cooperative. Normal mood and affect.  Imaging Studies: No results found.  Assessment and Plan:   Dean Larsen is a 70 y.o. y/o male who comes in with dysphagia who had been dilated in the past by Dr. Candace Cruise. The patient states that his dysphagia has returned and he would like to switch his care to me at this time. The patient will be set up for an upper endoscopy with possible dilation of his esophagus. I have discussed risks & benefits which include, but are not limited to, bleeding, infection, perforation & drug reaction.  The patient agrees with this plan & written consent will be obtained.      Note: This dictation was prepared with Dragon dictation along with smaller phrase technology. Any transcriptional errors that result from this process are unintentional.

## 2016-05-19 ENCOUNTER — Encounter: Payer: Self-pay | Admitting: *Deleted

## 2016-05-22 DIAGNOSIS — R1310 Dysphagia, unspecified: Secondary | ICD-10-CM | POA: Diagnosis not present

## 2016-05-22 DIAGNOSIS — K2970 Gastritis, unspecified, without bleeding: Secondary | ICD-10-CM | POA: Diagnosis not present

## 2016-05-22 NOTE — Discharge Instructions (Signed)

## 2016-05-23 ENCOUNTER — Ambulatory Visit: Payer: Medicare Other | Admitting: Anesthesiology

## 2016-05-23 ENCOUNTER — Encounter
Admission: RE | Disposition: A | Discharge status: Home / Self Care | Payer: Self-pay | Source: Ambulatory Visit | Attending: Gastroenterology

## 2016-05-23 ENCOUNTER — Ambulatory Visit
Admission: RE | Admit: 2016-05-23 | Discharge: 2016-05-23 | Disposition: A | Discharge status: Home / Self Care | Payer: Medicare Other | Source: Ambulatory Visit | Attending: Gastroenterology | Admitting: Gastroenterology

## 2016-05-23 DIAGNOSIS — M1990 Unspecified osteoarthritis, unspecified site: Secondary | ICD-10-CM | POA: Insufficient documentation

## 2016-05-23 DIAGNOSIS — Z8673 Personal history of transient ischemic attack (TIA), and cerebral infarction without residual deficits: Secondary | ICD-10-CM | POA: Diagnosis not present

## 2016-05-23 DIAGNOSIS — D509 Iron deficiency anemia, unspecified: Secondary | ICD-10-CM | POA: Insufficient documentation

## 2016-05-23 DIAGNOSIS — E785 Hyperlipidemia, unspecified: Secondary | ICD-10-CM | POA: Diagnosis not present

## 2016-05-23 DIAGNOSIS — Z880 Allergy status to penicillin: Secondary | ICD-10-CM | POA: Diagnosis not present

## 2016-05-23 DIAGNOSIS — Z79899 Other long term (current) drug therapy: Secondary | ICD-10-CM | POA: Diagnosis not present

## 2016-05-23 DIAGNOSIS — K219 Gastro-esophageal reflux disease without esophagitis: Secondary | ICD-10-CM | POA: Insufficient documentation

## 2016-05-23 DIAGNOSIS — K5732 Diverticulitis of large intestine without perforation or abscess without bleeding: Secondary | ICD-10-CM | POA: Diagnosis not present

## 2016-05-23 DIAGNOSIS — R1310 Dysphagia, unspecified: Secondary | ICD-10-CM

## 2016-05-23 DIAGNOSIS — Z885 Allergy status to narcotic agent status: Secondary | ICD-10-CM | POA: Insufficient documentation

## 2016-05-23 DIAGNOSIS — I10 Essential (primary) hypertension: Secondary | ICD-10-CM | POA: Insufficient documentation

## 2016-05-23 DIAGNOSIS — K2970 Gastritis, unspecified, without bleeding: Secondary | ICD-10-CM

## 2016-05-23 DIAGNOSIS — Z7982 Long term (current) use of aspirin: Secondary | ICD-10-CM | POA: Insufficient documentation

## 2016-05-23 DIAGNOSIS — F319 Bipolar disorder, unspecified: Secondary | ICD-10-CM | POA: Diagnosis not present

## 2016-05-23 DIAGNOSIS — Z87891 Personal history of nicotine dependence: Secondary | ICD-10-CM | POA: Diagnosis not present

## 2016-05-23 DIAGNOSIS — K2930 Chronic superficial gastritis without bleeding: Secondary | ICD-10-CM | POA: Diagnosis not present

## 2016-05-23 HISTORY — PX: ESOPHAGOGASTRODUODENOSCOPY (EGD) WITH PROPOFOL: SHX5813

## 2016-05-23 HISTORY — DX: Unspecified osteoarthritis, unspecified site: M19.90

## 2016-05-23 SURGERY — ESOPHAGOGASTRODUODENOSCOPY (EGD) WITH PROPOFOL
Anesthesia: Monitor Anesthesia Care | Wound class: Clean Contaminated

## 2016-05-23 MED ORDER — ONDANSETRON HCL 4 MG/2ML IJ SOLN: 4.0000 mg | Freq: Once | INTRAMUSCULAR | Status: DC | PRN

## 2016-05-23 MED ORDER — SIMETHICONE 40 MG/0.6ML PO SUSP
ORAL | Status: DC | PRN
  Administered 2016-05-23: 09:00:00

## 2016-05-23 MED ORDER — GLYCOPYRROLATE 0.2 MG/ML IJ SOLN
INTRAMUSCULAR | Status: DC | PRN
  Administered 2016-05-23: 0.2 mg via INTRAVENOUS

## 2016-05-23 MED ORDER — ACETAMINOPHEN 325 MG PO TABS: 325.0000 mg | ORAL_TABLET | ORAL | Status: DC | PRN

## 2016-05-23 MED ORDER — PROPOFOL 10 MG/ML IV BOLUS
INTRAVENOUS | Status: DC | PRN
  Administered 2016-05-23: 50 mg via INTRAVENOUS
  Administered 2016-05-23: 100 mg via INTRAVENOUS

## 2016-05-23 MED ORDER — ACETAMINOPHEN 160 MG/5ML PO SOLN: 325.0000 mg | ORAL | Status: DC | PRN

## 2016-05-23 MED ORDER — LACTATED RINGERS IV SOLN
INTRAVENOUS | Status: DC
  Administered 2016-05-23 (×2): via INTRAVENOUS

## 2016-05-23 MED ORDER — LIDOCAINE HCL (CARDIAC) 20 MG/ML IV SOLN
INTRAVENOUS | Status: DC | PRN
  Administered 2016-05-23: 40 mg via INTRAVENOUS

## 2016-05-23 SURGICAL SUPPLY — 31 items
BALLN DILATOR 10-12 8 (BALLOONS)
BALLN DILATOR 12-15 8 (BALLOONS)
BALLN DILATOR 15-18 8 (BALLOONS)
BALLN DILATOR CRE 0-12 8 (BALLOONS)
BALLN DILATOR ESOPH 8 10 CRE (MISCELLANEOUS) IMPLANT
BALLOON DILATOR 12-15 8 (BALLOONS) IMPLANT
BALLOON DILATOR 15-18 8 (BALLOONS) IMPLANT
BALLOON DILATOR CRE 0-12 8 (BALLOONS) IMPLANT
BLOCK BITE 60FR ADLT L/F GRN (MISCELLANEOUS) ×3 IMPLANT
CANISTER SUCT 1200ML W/VALVE (MISCELLANEOUS) ×3 IMPLANT
CLIP HMST 235XBRD CATH ROT (MISCELLANEOUS) IMPLANT
CLIP RESOLUTION 360 11X235 (MISCELLANEOUS)
FCP ESCP3.2XJMB 240X2.8X (MISCELLANEOUS)
FORCEPS BIOP RAD 4 LRG CAP 4 (CUTTING FORCEPS) ×3 IMPLANT
FORCEPS BIOP RJ4 240 W/NDL (MISCELLANEOUS)
FORCEPS ESCP3.2XJMB 240X2.8X (MISCELLANEOUS) IMPLANT
GOWN CVR UNV OPN BCK APRN NK (MISCELLANEOUS) ×2 IMPLANT
GOWN ISOL THUMB LOOP REG UNIV (MISCELLANEOUS) ×4
INJECTOR VARIJECT VIN23 (MISCELLANEOUS) IMPLANT
KIT DEFENDO VALVE AND CONN (KITS) IMPLANT
KIT ENDO PROCEDURE OLY (KITS) ×3 IMPLANT
MARKER SPOT ENDO TATTOO 5ML (MISCELLANEOUS) IMPLANT
PAD GROUND ADULT SPLIT (MISCELLANEOUS) IMPLANT
SNARE SHORT THROW 13M SML OVAL (MISCELLANEOUS) IMPLANT
SNARE SHORT THROW 30M LRG OVAL (MISCELLANEOUS) IMPLANT
SPOT EX ENDOSCOPIC TATTOO (MISCELLANEOUS)
SYR INFLATION 60ML (SYRINGE) IMPLANT
TRAP ETRAP POLY (MISCELLANEOUS) IMPLANT
VARIJECT INJECTOR VIN23 (MISCELLANEOUS)
WATER STERILE IRR 250ML POUR (IV SOLUTION) ×3 IMPLANT
WIRE CRE 18-20MM 8CM F G (MISCELLANEOUS) IMPLANT

## 2016-05-23 NOTE — Anesthesia Preprocedure Evaluation (Signed)
Anesthesia Evaluation  Patient identified by MRN, date of birth, ID band Patient awake    Reviewed: Allergy & Precautions, H&P , NPO status   Airway Mallampati: II  TM Distance: >3 FB Neck ROM: full    Dental   Pulmonary former smoker,    breath sounds clear to auscultation       Cardiovascular hypertension, Normal cardiovascular exam     Neuro/Psych PSYCHIATRIC DISORDERS CVA    GI/Hepatic GERD  ,  Endo/Other    Renal/GU Renal disease     Musculoskeletal   Abdominal   Peds  Hematology  (+) anemia ,   Anesthesia Other Findings   Reproductive/Obstetrics                             Anesthesia Physical Anesthesia Plan  ASA: II  Anesthesia Plan: MAC   Post-op Pain Management:    Induction:   Airway Management Planned:   Additional Equipment:   Intra-op Plan:   Post-operative Plan:   Informed Consent: I have reviewed the patients History and Physical, chart, labs and discussed the procedure including the risks, benefits and alternatives for the proposed anesthesia with the patient or authorized representative who has indicated his/her understanding and acceptance.     Plan Discussed with: CRNA  Anesthesia Plan Comments:         Anesthesia Quick Evaluation

## 2016-05-23 NOTE — H&P (Signed)
[facility]  [address], [place], [place] [address] Phone: [phone] Fax : [phone]  Primary Care Physician:  Golden Pop, MD Primary Gastroenterologist:  Dr. Allen Norris  Pre-Procedure History & Physical: HPI:  Dean Larsen is a 70 y.o. male is here for an endoscopy.   Past Medical History  Diagnosis Date  . Bipolar disorder ([place])   . Hyperlipidemia   . GERD (gastroesophageal reflux disease)   . Diverticulitis of colon   . Iron deficiency anemia   . Hypertension   . CVA (cerebral vascular accident) [facility])     Age 59 - no deficits  . Arthritis     Past Surgical History  Procedure Laterality Date  . Hernia repair    . Orthopedic surgery      Surgery for broken leg and toe    Prior to Admission medications   Medication Sig Start Date End Date Taking? Authorizing Provider  aspirin 81 MG tablet Take 81 mg by mouth daily.   Yes Historical Provider, MD  buPROPion (WELLBUTRIN XL) 300 MG 24 hr tablet Take 1 tablet (300 mg total) by mouth daily. 04/16/16  Yes Guadalupe Maple, MD  Choline Fenofibrate (TRILIPIX) 135 MG capsule Take 1 capsule (135 mg total) by mouth daily. 04/16/16  Yes Guadalupe Maple, MD  divalproex (DEPAKOTE ER) 250 MG 24 hr tablet Take 1 tablet (250 mg total) by mouth 2 (two) times daily. 04/16/16  Yes Guadalupe Maple, MD  DULoxetine (CYMBALTA) 30 MG capsule Take 1 capsule (30 mg total) by mouth daily. 04/16/16  Yes Guadalupe Maple, MD  lisinopril (PRINIVIL,ZESTRIL) 20 MG tablet Take 1 tablet (20 mg total) by mouth daily. 04/16/16  Yes Guadalupe Maple, MD  risperiDONE (RISPERDAL) 2 MG tablet Take 1 tablet (2 mg total) by mouth daily. 04/16/16  Yes Guadalupe Maple, MD    Allergies as of 05/13/2016 - Review Complete 05/12/2016  Allergen Reaction Noted  . Codeine  09/25/2015  . Percocet [oxycodone-acetaminophen]  09/25/2015  . Penicillin g benzathine Rash 09/25/2015    Family History  Problem Relation Age of Onset  . Stroke Father   .  Diabetes Brother     Social History   Social History  . Marital Status: Married    Spouse Name: N/A  . Number of Children: N/A  . Years of Education: N/A   Occupational History  . Not on file.   Social History Main Topics  . Smoking status: Former Smoker    Quit date: 12/29/1978  . Smokeless tobacco: Current User    Types: Snuff  . Alcohol Use: No  . Drug Use: No  . Sexual Activity: Not on file   Other Topics Concern  . Not on file   Social History Narrative    Review of Systems: See HPI, otherwise negative ROS  Physical Exam: BP 132/73 mmHg  Pulse 62  Temp(Src) 97.3 F (36.3 C) (Temporal)  Resp 16  Ht 5\' 3"  (1.6 m)  Wt 173 lb (78.472 kg)  BMI 30.65 kg/m2  SpO2 97% General:   Alert,  pleasant and cooperative in NAD Head:  Normocephalic and atraumatic. Neck:  Supple; no masses or thyromegaly. Lungs:  Clear throughout to auscultation.    Heart:  Regular rate and rhythm. Abdomen:  Soft, nontender and nondistended. Normal bowel sounds, without guarding, and without rebound.   Neurologic:  Alert and  oriented x4;  grossly normal neurologically.  Impression/Plan: Dean Larsen is here for an endoscopy to  be performed for dysphagia  Risks, benefits, limitations, and alternatives regarding  endoscopy have been reviewed with the patient.  Questions have been answered.  All parties agreeable.   Lucilla Lame, MD  05/23/2016, 8:25 AM

## 2016-05-23 NOTE — Transfer of Care (Signed)
Immediate Anesthesia Transfer of Care Note  Patient: Dean Larsen  Procedure(s) Performed: Procedure(s): ESOPHAGOGASTRODUODENOSCOPY (EGD) WITH PROPOFOL with dialation (N/A)  Patient Location: PACU  Anesthesia Type: MAC  Level of Consciousness: awake, alert  and patient cooperative  Airway and Oxygen Therapy: Patient Spontanous Breathing and Patient connected to supplemental oxygen  Post-op Assessment: Post-op Vital signs reviewed, Patient's Cardiovascular Status Stable, Respiratory Function Stable, Patent Airway and No signs of Nausea or vomiting  Post-op Vital Signs: Reviewed and stable  Complications: No apparent anesthesia complications

## 2016-05-23 NOTE — Anesthesia Postprocedure Evaluation (Signed)
Anesthesia Post Note  Patient: Dean Larsen  Procedure(s) Performed: Procedure(s) (LRB): ESOPHAGOGASTRODUODENOSCOPY (EGD) WITH PROPOFOL with dialation (N/A)  Patient location during evaluation: PACU Anesthesia Type: MAC Level of consciousness: awake and alert Pain management: pain level controlled Vital Signs Assessment: post-procedure vital signs reviewed and stable Respiratory status: spontaneous breathing, nonlabored ventilation, respiratory function stable and patient connected to nasal cannula oxygen Cardiovascular status: stable and blood pressure returned to baseline Anesthetic complications: no    Amaryllis Dyke

## 2016-05-23 NOTE — Anesthesia Procedure Notes (Signed)
Procedure Name: MAC Performed by: Latrel Szymczak Pre-anesthesia Checklist: Patient identified, Emergency Drugs available, Suction available, Patient being monitored and Timeout performed Patient Re-evaluated:Patient Re-evaluated prior to inductionOxygen Delivery Method: Nasal cannula       

## 2016-05-23 NOTE — Op Note (Signed)
[facility] Gastroenterology Patient Name: Dean Larsen Procedure Date: 05/23/2016 9:18 AM MRN: [id number] Account #: [id number] Date of Birth: 1946/11/07 Admit Type: Outpatient Age: 70 Room: [facility] OR ROOM 01 Gender: Male Note Status: Finalized Procedure:            Upper GI endoscopy Indications:          Dysphagia Providers:            Lucilla Lame, MD Referring MD:         Guadalupe Maple, MD (Referring MD) Medicines:            Propofol per Anesthesia Complications:        No immediate complications. Procedure:            Pre-Anesthesia Assessment:                       - Prior to the procedure, a History and Physical was                        performed, and patient medications and allergies were                        reviewed. The patient's tolerance of previous                        anesthesia was also reviewed. The risks and benefits of                        the procedure and the sedation options and risks were                        discussed with the patient. All questions were                        answered, and informed consent was obtained. Prior                        Anticoagulants: The patient has taken no previous                        anticoagulant or antiplatelet agents. ASA Grade                        Assessment: II - A patient with mild systemic disease.                        After reviewing the risks and benefits, the patient was                        deemed in satisfactory condition to undergo the                        procedure.                       After obtaining informed consent, the endoscope was                        passed under direct vision. Throughout the procedure,  the patient's blood pressure, pulse, and oxygen                        saturations were monitored continuously. The Olympus                        GIF H180J endoscope (S#: [id number]) was introduced                        through the  mouth, and advanced to the second part of                        duodenum. The upper GI endoscopy was accomplished                        without difficulty. The patient tolerated the procedure                        well. Findings:      The examined esophagus was normal. The scope was withdrawn. Dilation was       performed with a Maloney dilator with no resistance at 74 Fr and 54 Fr.      Localized moderate inflammation characterized by erythema was found in       the gastric antrum. Biopsies were taken with a cold forceps for       histology.      The examined duodenum was normal. Impression:           - Normal esophagus. Dilated.                       - Gastritis. Biopsied.                       - Normal examined duodenum. Recommendation:       - Await pathology results. Procedure Code(s):    --- Professional ---                       [phone], Esophagogastroduodenoscopy, flexible, transoral;                        with biopsy, single or multiple                       43450, Dilation of esophagus, by unguided sound or                        bougie, single or multiple passes Diagnosis Code(s):    --- Professional ---                       R13.10, Dysphagia, unspecified                       K29.70, Gastritis, unspecified, without bleeding CPT copyright 2016 American Medical Association. All rights reserved. The codes documented in this report are preliminary and upon coder review may  be revised to meet current compliance requirements. Lucilla Lame, MD 05/23/2016 9:33:54 AM This report has been signed electronically. Number of Addenda: 0 Note Initiated On: 05/23/2016 9:18 AM Total Procedure Duration: 0 hours 7 minutes 19 seconds       [facility]

## 2016-05-26 ENCOUNTER — Encounter: Payer: Self-pay | Admitting: Gastroenterology

## 2016-05-30 ENCOUNTER — Encounter: Payer: Self-pay | Admitting: Gastroenterology

## 2016-06-04 ENCOUNTER — Telehealth: Payer: Self-pay

## 2016-06-04 NOTE — Telephone Encounter (Signed)
Spoke with pt's daughter. Pt scheduled for a follow up on Wed, June 21st.

## 2016-06-04 NOTE — Telephone Encounter (Signed)
-----   Message from Lucilla Lame, MD sent at 06/02/2016 12:40 PM EDT ----- Please have the patient come in for a follow up.

## 2016-06-18 ENCOUNTER — Ambulatory Visit: Payer: Self-pay | Admitting: Gastroenterology

## 2016-07-14 ENCOUNTER — Ambulatory Visit: Payer: Self-pay | Admitting: Gastroenterology

## 2016-08-27 ENCOUNTER — Other Ambulatory Visit: Payer: Self-pay | Admitting: Family Medicine

## 2016-08-27 DIAGNOSIS — F3170 Bipolar disorder, currently in remission, most recent episode unspecified: Secondary | ICD-10-CM

## 2016-09-24 ENCOUNTER — Ambulatory Visit (INDEPENDENT_AMBULATORY_CARE_PROVIDER_SITE_OTHER): Payer: Medicare Other | Admitting: Gastroenterology

## 2016-09-24 ENCOUNTER — Encounter: Payer: Self-pay | Admitting: Gastroenterology

## 2016-09-24 VITALS — BP 125/68 | HR 73 | Temp 98.2°F | Ht 63.0 in | Wt 169.0 lb

## 2016-09-24 DIAGNOSIS — K3189 Other diseases of stomach and duodenum: Secondary | ICD-10-CM | POA: Diagnosis not present

## 2016-09-24 DIAGNOSIS — K31A0 Gastric intestinal metaplasia, unspecified: Secondary | ICD-10-CM

## 2016-09-24 NOTE — Progress Notes (Signed)
   Primary Care Physician: Golden Pop, MD  Primary Gastroenterologist:  Dr. Lucilla Lame  Chief Complaint  Patient presents with  . Follow up EGD results    HPI: Dean Larsen is a 70 y.o. male here for follow-up after having an upper endoscopy back in May. The patient did not follow up at that time despite being told to follow-up. He reports that he has been swallowing much better since he had the EGD with dilation. The patient has no complaint the present time. The patient was brought back to discuss his pathology results that showed gastric intestinal metaplasia of the stomach.  Current Outpatient Prescriptions  Medication Sig Dispense Refill  . aspirin 81 MG tablet Take 81 mg by mouth daily.    Marland Kitchen buPROPion (WELLBUTRIN XL) 300 MG 24 hr tablet Take 1 tablet (300 mg total) by mouth daily. 30 tablet 12  . Choline Fenofibrate (TRILIPIX) 135 MG capsule Take 1 capsule (135 mg total) by mouth daily. 30 capsule 12  . divalproex (DEPAKOTE ER) 250 MG 24 hr tablet Take 1 tablet (250 mg total) by mouth 2 (two) times daily. 180 tablet 4  . DULoxetine (CYMBALTA) 30 MG capsule Take 1 capsule (30 mg total) by mouth daily. 30 capsule 12  . lisinopril (PRINIVIL,ZESTRIL) 20 MG tablet Take 1 tablet (20 mg total) by mouth daily. 30 tablet 12  . risperiDONE (RISPERDAL) 2 MG tablet Take 1 tablet (2 mg total) by mouth daily. 30 tablet 12   No current facility-administered medications for this visit.     Allergies as of 09/24/2016 - Review Complete 09/24/2016  Allergen Reaction Noted  . Codeine Rash 09/25/2015  . Penicillin g benzathine Rash 09/25/2015  . Percocet [oxycodone-acetaminophen] Rash 09/25/2015    ROS:  General: Negative for anorexia, weight loss, fever, chills, fatigue, weakness. ENT: Negative for hoarseness, difficulty swallowing , nasal congestion. CV: Negative for chest pain, angina, palpitations, dyspnea on exertion, peripheral edema.  Respiratory: Negative for dyspnea at rest,  dyspnea on exertion, cough, sputum, wheezing.  GI: See history of present illness. GU:  Negative for dysuria, hematuria, urinary incontinence, urinary frequency, nocturnal urination.  Endo: Negative for unusual weight change.    Physical Examination:   BP 125/68   Pulse 73   Temp 98.2 F (36.8 C) (Oral)   Ht 5\' 3"  (1.6 m)   Wt 169 lb (76.7 kg)   BMI 29.94 kg/m   General: Well-nourished, well-developed in no acute distress.  Eyes: No icterus. Conjunctivae pink. Extremities: No lower extremity edema. No clubbing or deformities. Neuro: Alert and oriented x 3.  Grossly intact. Skin: Warm and dry, no jaundice.   Psych: Alert and cooperative, normal mood and affect.  Labs:    Imaging Studies: No results found.  Assessment and Plan:   Dean Larsen is a 70 y.o. y/o male who had an EGD with gastric intestinal metaplasia. The patient has been told about the premalignant nature of gastric intestinal metaplasia. The patient will need a repeat EGD in 3 years.   Note: This dictation was prepared with Dragon dictation along with smaller phrase technology. Any transcriptional errors that result from this process are unintentional.

## 2016-09-29 ENCOUNTER — Other Ambulatory Visit: Payer: Self-pay | Admitting: Family Medicine

## 2016-09-29 DIAGNOSIS — F3170 Bipolar disorder, currently in remission, most recent episode unspecified: Secondary | ICD-10-CM

## 2016-09-30 ENCOUNTER — Other Ambulatory Visit: Payer: Self-pay | Admitting: Family Medicine

## 2016-09-30 DIAGNOSIS — F3170 Bipolar disorder, currently in remission, most recent episode unspecified: Secondary | ICD-10-CM

## 2016-10-02 ENCOUNTER — Ambulatory Visit: Payer: Medicare Other | Admitting: Gastroenterology

## 2016-10-16 ENCOUNTER — Ambulatory Visit (INDEPENDENT_AMBULATORY_CARE_PROVIDER_SITE_OTHER): Payer: Medicare Other | Admitting: Family Medicine

## 2016-10-16 ENCOUNTER — Encounter: Payer: Self-pay | Admitting: Family Medicine

## 2016-10-16 VITALS — BP 130/75 | HR 63 | Temp 97.5°F | Wt 173.0 lb

## 2016-10-16 DIAGNOSIS — E784 Other hyperlipidemia: Secondary | ICD-10-CM

## 2016-10-16 DIAGNOSIS — I10 Essential (primary) hypertension: Secondary | ICD-10-CM

## 2016-10-16 DIAGNOSIS — E7849 Other hyperlipidemia: Secondary | ICD-10-CM

## 2016-10-16 DIAGNOSIS — Z7689 Persons encountering health services in other specified circumstances: Secondary | ICD-10-CM | POA: Diagnosis not present

## 2016-10-16 DIAGNOSIS — Z23 Encounter for immunization: Secondary | ICD-10-CM | POA: Diagnosis not present

## 2016-10-16 DIAGNOSIS — K222 Esophageal obstruction: Secondary | ICD-10-CM

## 2016-10-16 DIAGNOSIS — F3170 Bipolar disorder, currently in remission, most recent episode unspecified: Secondary | ICD-10-CM

## 2016-10-16 NOTE — Assessment & Plan Note (Signed)
The current medical regimen is effective;  continue present plan and medications.  

## 2016-10-16 NOTE — Progress Notes (Signed)
BP 130/75   Pulse 63   Temp 97.5 F (36.4 C)   Wt 173 lb (78.5 kg)   SpO2 99%   BMI 30.65 kg/m    Subjective:    Patient ID: Dean Larsen, male    DOB: 07/18/1946, 70 y.o.   MRN: [id number]  HPI: Dean Larsen is a 70 y.o. male  Chief Complaint  Patient presents with  . Hyperlipidemia  . Hypertension  Patient follow-up a pressure cholesterol doing well no complaints accompanied by daughter who also assists with history. Patient taking medications without problems and faithfully. Nerve medicines doing well with good control no issues with nerves sleeping okay and no complaints. 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 On review with several lentigo and seborrheic keratosis no other lesions of concern.   Relevant past medical, surgical, family and social history reviewed and updated as indicated. Interim medical history since our last visit reviewed. Allergies and medications reviewed and updated.  Review of Systems  Constitutional: Negative.   Respiratory: Negative.   Cardiovascular: Negative.     Per HPI unless specifically indicated above     Objective:    BP 130/75   Pulse 63   Temp 97.5 F (36.4 C)   Wt 173 lb (78.5 kg)   SpO2 99%   BMI 30.65 kg/m   Wt Readings from Last 3 Encounters:  10/16/16 173 lb (78.5 kg)  09/24/16 169 lb (76.7 kg)  05/23/16 173 lb (78.5 kg)    Physical Exam  Constitutional: He is oriented to person, place, and time. He appears well-developed and well-nourished. No distress.  HENT:  Head: Normocephalic and atraumatic.  Right Ear: Hearing normal.  Left Ear: Hearing normal.  Nose: Nose normal.  Eyes: Conjunctivae and lids are normal. Right eye exhibits no discharge. Left eye exhibits no discharge. No scleral icterus.  Cardiovascular: Normal rate, regular rhythm and normal heart sounds.   Pulmonary/Chest: Effort normal and breath sounds normal. No respiratory distress.    Musculoskeletal: Normal range of motion.  Neurological: He is alert and oriented to person, place, and time.  Skin: Skin is intact. No rash noted.  Lentigo seborrheic keratosis  Psychiatric: He has a normal mood and affect. His speech is normal and behavior is normal. Judgment and thought content normal. Cognition and memory are normal.    Results for orders placed or performed in visit on 04/16/16  Hepatitis C Antibody  Result Value Ref Range   Hep C Virus Ab <0.1 0.0 - 0.9 s/co ratio  Comprehensive metabolic panel  Result Value Ref Range   Glucose 77 65 - 99 mg/dL   BUN 17 8 - 27 mg/dL   Creatinine, Ser 1.50 (H) 0.76 - 1.27 mg/dL   GFR calc non Af Amer 46 (L) >59 mL/min/1.73   GFR calc Af Amer 54 (L) >59 mL/min/1.73   BUN/Creatinine Ratio 11 10 - 24   Sodium 144 134 - 144 mmol/L   Potassium 5.0 3.5 - 5.2 mmol/L   Chloride 101 96 - 106 mmol/L   CO2 26 18 - 29 mmol/L   Calcium 9.2 8.6 - 10.2 mg/dL   Total Protein 6.9 6.0 - 8.5 g/dL   Albumin 4.4 3.5 - 4.8 g/dL   Globulin, Total 2.5 1.5 - 4.5 g/dL   Albumin/Globulin Ratio 1.8 1.2 - 2.2   Bilirubin Total 0.6 0.0 - 1.2 mg/dL   Alkaline Phosphatase 18 (L) 39 - 117 IU/L  AST 18 0 - 40 IU/L   ALT 21 0 - 44 IU/L  Lipid panel  Result Value Ref Range   Cholesterol, Total 209 (H) 100 - 199 mg/dL   Triglycerides 193 (H) 0 - 149 mg/dL   HDL 44 >39 mg/dL   VLDL Cholesterol Cal 39 5 - 40 mg/dL   LDL Calculated 126 (H) 0 - 99 mg/dL   Chol/HDL Ratio 4.8 0.0 - 5.0 ratio units  CBC with Differential/Platelet  Result Value Ref Range   WBC 5.1 3.4 - 10.8 x10E3/uL   RBC 4.19 4.14 - 5.80 x10E6/uL   Hemoglobin 12.5 (L) 12.6 - 17.7 g/dL   Hematocrit 37.8 37.5 - 51.0 %   MCV 90 79 - 97 fL   MCH 29.8 26.6 - 33.0 pg   MCHC 33.1 31.5 - 35.7 g/dL   RDW 13.9 12.3 - 15.4 %   Platelets 206 150 - 379 x10E3/uL   Neutrophils 54 %   Lymphs 32 %   Monocytes 10 %   Eos 3 %   Basos 1 %   Neutrophils Absolute 2.7 1.4 - 7.0 x10E3/uL   Lymphocytes  Absolute 1.7 0.7 - 3.1 x10E3/uL   Monocytes Absolute 0.5 0.1 - 0.9 x10E3/uL   EOS (ABSOLUTE) 0.2 0.0 - 0.4 x10E3/uL   Basophils Absolute 0.0 0.0 - 0.2 x10E3/uL   Immature Granulocytes 0 %   Immature Grans (Abs) 0.0 0.0 - 0.1 x10E3/uL  TSH  Result Value Ref Range   TSH 1.940 0.450 - 4.500 uIU/mL  Urinalysis, Routine w reflex microscopic (not at [facility])  Result Value Ref Range   Specific Gravity, UA 1.020 1.005 - 1.030   pH, UA 6.0 5.0 - 7.5   Color, UA Yellow Yellow   Appearance Ur Clear Clear   Leukocytes, UA Negative Negative   Protein, UA Negative Negative/Trace   Glucose, UA Negative Negative   Ketones, UA Negative Negative   RBC, UA Negative Negative   Bilirubin, UA Negative Negative   Urobilinogen, Ur 1.0 0.2 - 1.0 mg/dL   Nitrite, UA Negative Negative  PSA  Result Value Ref Range   Prostate Specific Ag, Serum 4.1 (H) 0.0 - 4.0 ng/mL      Assessment & Plan:   Problem List Items Addressed This Visit      Cardiovascular and Mediastinum   Essential hypertension - Primary    The current medical regimen is effective;  continue present plan and medications.       Relevant Orders   Basic metabolic panel     Digestive   Esophageal stenosis    Doing well ever since esophageal dilation        Other   Bipolar disorder ([place])    The current medical regimen is effective;  continue present plan and medications.       Hyperlipidemia    Unable to do an office will await results       Relevant Orders   LP+ALT+AST Piccolo, [place]    Other Visit Diagnoses    Needs flu shot       Encounter for immunization       Relevant Orders   Flu vaccine HIGH DOSE PF (Completed)       Follow up plan: Return in about 6 months (around 04/16/2017) for Physical Exam.

## 2016-10-16 NOTE — Assessment & Plan Note (Signed)
Doing well ever since esophageal dilation

## 2016-10-16 NOTE — Assessment & Plan Note (Addendum)
Unable to do an office will await results

## 2016-10-17 LAB — BASIC METABOLIC PANEL
BUN / CREAT RATIO: 15 (ref 10–24)
BUN: 23 mg/dL (ref 8–27)
CALCIUM: 9.4 mg/dL (ref 8.6–10.2)
CHLORIDE: 100 mmol/L (ref 96–106)
CO2: 27 mmol/L (ref 18–29)
CREATININE: 1.54 mg/dL — AB (ref 0.76–1.27)
GFR calc Af Amer: 52 mL/min/{1.73_m2} — ABNORMAL LOW (ref >59)
GFR calc non Af Amer: 45 mL/min/{1.73_m2} — ABNORMAL LOW (ref >59)
GLUCOSE: 90 mg/dL (ref 65–99)
Potassium: 5.5 mmol/L — ABNORMAL HIGH (ref 3.5–5.2)
Sodium: 140 mmol/L (ref 134–144)

## 2016-10-18 LAB — LIPID PANEL W/O CHOL/HDL RATIO
Cholesterol, Total: 263 mg/dL — ABNORMAL HIGH (ref 100–199)
HDL: 31 mg/dL — ABNORMAL LOW (ref >39)
Triglycerides: 586 mg/dL (ref 0–149)

## 2016-10-18 LAB — SPECIMEN STATUS REPORT

## 2016-10-20 ENCOUNTER — Telehealth: Payer: Self-pay | Admitting: Family Medicine

## 2016-10-20 NOTE — Telephone Encounter (Signed)
Phone call Discussed with patient's daughter elevated potassium and cholesterol patient's been eating a lot of junk food reviewed with daughter about better diet and decided with patient's mental condition and physical condition to pretty much leave him alone tried to encourage better diet but otherwise keep him comfortable.

## 2016-10-20 NOTE — Telephone Encounter (Signed)
-----   Message from Staci Acosta, [place] sent at 10/20/2016 12:02 PM EDT ----- Daughter on line, hippa ok

## 2016-10-21 LAB — LP+ALT+AST PICCOLO, WAIVED
ALT (SGPT) PICCOLO, WAIVED: 26 U/L (ref 10–47)
AST (SGOT) PICCOLO, WAIVED: 23 U/L (ref 11–38)

## 2017-04-06 ENCOUNTER — Other Ambulatory Visit: Payer: Self-pay

## 2017-04-06 DIAGNOSIS — I129 Hypertensive chronic kidney disease with stage 1 through stage 4 chronic kidney disease, or unspecified chronic kidney disease: Secondary | ICD-10-CM

## 2017-04-06 DIAGNOSIS — N183 Chronic kidney disease, stage 3 (moderate): Principal | ICD-10-CM

## 2017-04-06 MED ORDER — LISINOPRIL 20 MG PO TABS: 20.0000 mg | ORAL_TABLET | Freq: Every day | ORAL | 1 refills | Status: DC

## 2017-04-06 NOTE — Telephone Encounter (Signed)
Pt requesting 90 day supply

## 2017-04-20 ENCOUNTER — Ambulatory Visit (INDEPENDENT_AMBULATORY_CARE_PROVIDER_SITE_OTHER): Payer: Medicare Other | Admitting: Family Medicine

## 2017-04-20 ENCOUNTER — Encounter: Payer: Self-pay | Admitting: Family Medicine

## 2017-04-20 VITALS — BP 146/69 | HR 64 | Ht 64.57 in | Wt 174.0 lb

## 2017-04-20 DIAGNOSIS — E785 Hyperlipidemia, unspecified: Secondary | ICD-10-CM

## 2017-04-20 DIAGNOSIS — N400 Enlarged prostate without lower urinary tract symptoms: Secondary | ICD-10-CM

## 2017-04-20 DIAGNOSIS — Z125 Encounter for screening for malignant neoplasm of prostate: Secondary | ICD-10-CM | POA: Diagnosis not present

## 2017-04-20 DIAGNOSIS — E784 Other hyperlipidemia: Secondary | ICD-10-CM

## 2017-04-20 DIAGNOSIS — Z13220 Encounter for screening for lipoid disorders: Secondary | ICD-10-CM | POA: Diagnosis not present

## 2017-04-20 DIAGNOSIS — I129 Hypertensive chronic kidney disease with stage 1 through stage 4 chronic kidney disease, or unspecified chronic kidney disease: Secondary | ICD-10-CM | POA: Diagnosis not present

## 2017-04-20 DIAGNOSIS — Z0000 Encounter for general adult medical examination without abnormal findings: Secondary | ICD-10-CM

## 2017-04-20 DIAGNOSIS — E7849 Other hyperlipidemia: Secondary | ICD-10-CM

## 2017-04-20 DIAGNOSIS — N183 Chronic kidney disease, stage 3 unspecified: Secondary | ICD-10-CM

## 2017-04-20 DIAGNOSIS — F3170 Bipolar disorder, currently in remission, most recent episode unspecified: Secondary | ICD-10-CM

## 2017-04-20 DIAGNOSIS — I10 Essential (primary) hypertension: Secondary | ICD-10-CM | POA: Diagnosis not present

## 2017-04-20 DIAGNOSIS — Z1329 Encounter for screening for other suspected endocrine disorder: Secondary | ICD-10-CM | POA: Diagnosis not present

## 2017-04-20 LAB — URINALYSIS, ROUTINE W REFLEX MICROSCOPIC
Bilirubin, UA: NEGATIVE
Glucose, UA: NEGATIVE
KETONES UA: NEGATIVE
Leukocytes, UA: NEGATIVE
NITRITE UA: NEGATIVE
Protein, UA: NEGATIVE
RBC, UA: NEGATIVE
Specific Gravity, UA: 1.025 (ref 1.005–1.030)
UUROB: 0.2 mg/dL (ref 0.2–1.0)
pH, UA: 6.5 (ref 5.0–7.5)

## 2017-04-20 LAB — MICROSCOPIC EXAMINATION
Bacteria, UA: NONE SEEN
RBC, UA: NONE SEEN /hpf (ref 0–?)
WBC UA: NONE SEEN /HPF (ref 0–?)

## 2017-04-20 MED ORDER — DULOXETINE HCL 30 MG PO CPEP: 30.0000 mg | ORAL_CAPSULE | Freq: Every day | ORAL | 4 refills | Status: DC

## 2017-04-20 MED ORDER — RISPERIDONE 2 MG PO TABS: 2.0000 mg | ORAL_TABLET | Freq: Every day | ORAL | 3 refills | Status: DC

## 2017-04-20 MED ORDER — CHOLINE FENOFIBRATE 135 MG PO CPDR: 135.0000 mg | DELAYED_RELEASE_CAPSULE | Freq: Every day | ORAL | 4 refills | Status: DC

## 2017-04-20 MED ORDER — DIVALPROEX SODIUM ER 250 MG PO TB24: 250.0000 mg | ORAL_TABLET | Freq: Two times a day (BID) | ORAL | 4 refills | Status: DC

## 2017-04-20 MED ORDER — LISINOPRIL 20 MG PO TABS: 20.0000 mg | ORAL_TABLET | Freq: Every day | ORAL | 4 refills | Status: DC

## 2017-04-20 MED ORDER — BUPROPION HCL ER (XL) 300 MG PO TB24: 300.0000 mg | ORAL_TABLET | Freq: Every day | ORAL | 4 refills | Status: DC

## 2017-04-20 NOTE — Assessment & Plan Note (Signed)
The current medical regimen is effective;  continue present plan and medications.  

## 2017-04-20 NOTE — Progress Notes (Signed)
BP (!) 146/69   Pulse 64   Ht 5' 4.57" (1.64 m)   Wt 174 lb (78.9 kg)   SpO2 97%   BMI 29.34 kg/m    Subjective:    Patient ID: Dean Larsen, male    DOB: 18-Sep-1946, 71 y.o.   MRN: [id number]  HPI: Dean Larsen is a 71 y.o. male  Chief Complaint  Patient presents with  . Annual Exam  Patient here with wife who assists with history patient somewhat sedated but that's been ongoing for some time. Patient also very irritable and mostly just sits all day long. Otherwise nerve pills are doing okay blood pressure doing okay as is triglyceride medicines. Concerned about overmedicated or undermedicated with his irritability but with things sedated and sitting mostly overmedicated concerns.  Relevant past medical, surgical, family and social history reviewed and updated as indicated. Interim medical history since our last visit reviewed. Allergies and medications reviewed and updated.  Review of Systems  Constitutional: Negative.   HENT: Negative.   Eyes: Negative.   Respiratory: Negative.   Cardiovascular: Negative.   Gastrointestinal: Negative.   Endocrine: Negative.   Genitourinary: Negative.   Musculoskeletal: Negative.   Skin: Negative.   Allergic/Immunologic: Negative.   Neurological: Negative.   Hematological: Negative.   Psychiatric/Behavioral: Negative.     Per HPI unless specifically indicated above     Objective:    BP (!) 146/69   Pulse 64   Ht 5' 4.57" (1.64 m)   Wt 174 lb (78.9 kg)   SpO2 97%   BMI 29.34 kg/m   Wt Readings from Last 3 Encounters:  04/20/17 174 lb (78.9 kg)  10/16/16 173 lb (78.5 kg)  09/24/16 169 lb (76.7 kg)    Physical Exam  Constitutional: He is oriented to person, place, and time. He appears well-developed and well-nourished.  HENT:  Head: Normocephalic and atraumatic.  Right Ear: External ear normal.  Left Ear: External ear normal.  Eyes: Conjunctivae and EOM are normal. Pupils are equal, round, and reactive to light.   Neck: Normal range of motion. Neck supple.  Cardiovascular: Normal rate, regular rhythm, normal heart sounds and intact distal pulses.   Pulmonary/Chest: Effort normal and breath sounds normal.  Abdominal: Soft. Bowel sounds are normal. There is no splenomegaly or hepatomegaly.  Genitourinary: Rectum normal and penis normal.  Genitourinary Comments: BPH changes  Musculoskeletal: Normal range of motion.  Neurological: He is alert and oriented to person, place, and time. He has normal reflexes.  Skin: No rash noted. No erythema.  Psychiatric: He has a normal mood and affect. His behavior is normal. Judgment and thought content normal.    Results for orders placed or performed in visit on 10/16/16  LP+ALT+AST Piccolo, [employer]  Result Value Ref Range   ALT (SGPT) Piccolo, Waived 26 10 - 47 U/L   AST (SGOT) Piccolo, Waived 23 11 - 38 U/L   Cholesterol Piccolo, Waived CANCELED    HDL Chol Piccolo, Waived CANCELED    Triglycerides Piccolo,Waived CANCELED   Basic metabolic panel  Result Value Ref Range   Glucose 90 65 - 99 mg/dL   BUN 23 8 - 27 mg/dL   Creatinine, Ser 1.54 (H) 0.76 - 1.27 mg/dL   GFR calc non Af Amer 45 (L) >59 mL/min/1.73   GFR calc Af Amer 52 (L) >59 mL/min/1.73   BUN/Creatinine Ratio 15 10 - 24   Sodium 140 134 - 144 mmol/L   Potassium 5.5 (H) 3.5 - 5.2  mmol/L   Chloride 100 96 - 106 mmol/L   CO2 27 18 - 29 mmol/L   Calcium 9.4 8.6 - 10.2 mg/dL  Lipid Panel w/o Chol/HDL Ratio  Result Value Ref Range   Cholesterol, Total 263 (H) 100 - 199 mg/dL   Triglycerides 586 (HH) 0 - 149 mg/dL   HDL 31 (L) >39 mg/dL   VLDL Cholesterol Cal Comment 5 - 40 mg/dL   LDL Calculated Comment 0 - 99 mg/dL  Specimen status report  Result Value Ref Range   specimen status report Comment       Assessment & Plan:   Problem List Items Addressed This Visit      Cardiovascular and Mediastinum   Essential hypertension    The current medical regimen is effective;  continue present  plan and medications.       Relevant Orders   CBC with Differential/Platelet   Comprehensive metabolic panel   Urinalysis, Routine w reflex microscopic     Genitourinary   Hypertensive kidney disease with CKD stage III   BPH (benign prostatic hyperplasia)     Other   Bipolar disorder ([place])    Discussed bipolar with patient and wife patient may be overmedicated will decrease Risperdal from 2 mg 1 mg observe response will also consider psychiatric referral.      Hyperlipidemia    The current medical regimen is effective;  continue present plan and medications.       Relevant Orders   CBC with Differential/Platelet   Comprehensive metabolic panel   Lipid panel   Urinalysis, Routine w reflex microscopic    Other Visit Diagnoses    Annual physical exam    -  Primary   Relevant Orders   CBC with Differential/Platelet   Comprehensive metabolic panel   Lipid panel   PSA   TSH   Urinalysis, Routine w reflex microscopic   Screening cholesterol level       Relevant Orders   Lipid panel   Prostate cancer screening       Relevant Orders   PSA   Thyroid disorder screen       Relevant Orders   TSH       Follow up plan: Return in about 4 weeks (around 05/18/2017) for bipolar med check.

## 2017-04-20 NOTE — Assessment & Plan Note (Signed)
Discussed bipolar with patient and wife patient may be overmedicated will decrease Risperdal from 2 mg 1 mg observe response will also consider psychiatric referral.

## 2017-04-21 ENCOUNTER — Telehealth: Payer: Self-pay | Admitting: Family Medicine

## 2017-04-21 DIAGNOSIS — E7800 Pure hypercholesterolemia, unspecified: Secondary | ICD-10-CM

## 2017-04-21 DIAGNOSIS — R972 Elevated prostate specific antigen [PSA]: Secondary | ICD-10-CM

## 2017-04-21 LAB — COMPREHENSIVE METABOLIC PANEL
ALT: 23 IU/L (ref 0–44)
AST: 19 IU/L (ref 0–40)
Albumin/Globulin Ratio: 1.6 (ref 1.2–2.2)
Albumin: 4.2 g/dL (ref 3.5–4.8)
Alkaline Phosphatase: 27 IU/L — ABNORMAL LOW (ref 39–117)
BUN/Creatinine Ratio: 11 (ref 10–24)
BUN: 15 mg/dL (ref 8–27)
Bilirubin Total: 0.5 mg/dL (ref 0.0–1.2)
CALCIUM: 9.3 mg/dL (ref 8.6–10.2)
CO2: 24 mmol/L (ref 18–29)
CREATININE: 1.41 mg/dL — AB (ref 0.76–1.27)
Chloride: 103 mmol/L (ref 96–106)
GFR calc Af Amer: 58 mL/min/{1.73_m2} — ABNORMAL LOW (ref >59)
GFR, EST NON AFRICAN AMERICAN: 50 mL/min/{1.73_m2} — AB (ref >59)
GLOBULIN, TOTAL: 2.7 g/dL (ref 1.5–4.5)
Glucose: 98 mg/dL (ref 65–99)
Potassium: 4.6 mmol/L (ref 3.5–5.2)
SODIUM: 142 mmol/L (ref 134–144)
Total Protein: 6.9 g/dL (ref 6.0–8.5)

## 2017-04-21 LAB — PSA: Prostate Specific Ag, Serum: 7 ng/mL — ABNORMAL HIGH (ref 0.0–4.0)

## 2017-04-21 LAB — CBC WITH DIFFERENTIAL/PLATELET
Basophils Absolute: 0 10*3/uL (ref 0.0–0.2)
Basos: 0 %
EOS (ABSOLUTE): 0.1 10*3/uL (ref 0.0–0.4)
EOS: 2 %
HEMATOCRIT: 38.4 % (ref 37.5–51.0)
Hemoglobin: 12.9 g/dL — ABNORMAL LOW (ref 13.0–17.7)
IMMATURE GRANULOCYTES: 0 %
Immature Grans (Abs): 0 10*3/uL (ref 0.0–0.1)
Lymphocytes Absolute: 1.3 10*3/uL (ref 0.7–3.1)
Lymphs: 24 %
MCH: 29.5 pg (ref 26.6–33.0)
MCHC: 33.6 g/dL (ref 31.5–35.7)
MCV: 88 fL (ref 79–97)
MONOCYTES: 9 %
MONOS ABS: 0.5 10*3/uL (ref 0.1–0.9)
Neutrophils Absolute: 3.5 10*3/uL (ref 1.4–7.0)
Neutrophils: 65 %
Platelets: 183 10*3/uL (ref 150–379)
RBC: 4.38 x10E6/uL (ref 4.14–5.80)
RDW: 13.7 % (ref 12.3–15.4)
WBC: 5.4 10*3/uL (ref 3.4–10.8)

## 2017-04-21 LAB — LIPID PANEL
CHOL/HDL RATIO: 6.8 ratio — AB (ref 0.0–5.0)
Cholesterol, Total: 238 mg/dL — ABNORMAL HIGH (ref 100–199)
HDL: 35 mg/dL — AB (ref >39)
LDL CALC: 158 mg/dL — AB (ref 0–99)
TRIGLYCERIDES: 225 mg/dL — AB (ref 0–149)
VLDL Cholesterol Cal: 45 mg/dL — ABNORMAL HIGH (ref 5–40)

## 2017-04-21 LAB — TSH: TSH: 1.68 u[IU]/mL (ref 0.450–4.500)

## 2017-04-21 NOTE — Telephone Encounter (Signed)
Phone call Discussed patient's lab work with caregiver PSA elevated as his cholesterol. We will recheck PSA lipid panel 1 month or so.

## 2017-05-21 ENCOUNTER — Encounter: Payer: Self-pay | Admitting: Family Medicine

## 2017-05-21 ENCOUNTER — Ambulatory Visit (INDEPENDENT_AMBULATORY_CARE_PROVIDER_SITE_OTHER): Payer: Medicare Other | Admitting: Family Medicine

## 2017-05-21 VITALS — BP 136/68 | HR 62 | Wt 157.0 lb

## 2017-05-21 DIAGNOSIS — I10 Essential (primary) hypertension: Secondary | ICD-10-CM | POA: Diagnosis not present

## 2017-05-21 DIAGNOSIS — F3170 Bipolar disorder, currently in remission, most recent episode unspecified: Secondary | ICD-10-CM

## 2017-05-21 DIAGNOSIS — Z79899 Other long term (current) drug therapy: Secondary | ICD-10-CM | POA: Diagnosis not present

## 2017-05-21 NOTE — Assessment & Plan Note (Signed)
The current medical regimen is effective;  continue present plan and medications.  

## 2017-05-21 NOTE — Progress Notes (Signed)
BP 136/68 (BP Location: Left Arm)   Pulse 62   Wt 157 lb (71.2 kg)   SpO2 97%   BMI 26.48 kg/m    Subjective:    Patient ID: Dean Larsen, male    DOB: Aug 30, 1946, 71 y.o.   MRN: [id number]  HPI: Dean Larsen is a 71 y.o. male  Chief Complaint  Patient presents with  . Follow-up  Patient here with caregiver assists with history but obviously noticeable brighter affect and mood patient more active has been outside and active not sleeping as much. Mood and irritability though is not increased but not improved. Discussed going psychiatrist and get immediate pushed back  Relevant past medical, surgical, family and social history reviewed and updated as indicated. Interim medical history since our last visit reviewed. Allergies and medications reviewed and updated.  Review of Systems  Constitutional: Negative.   Respiratory: Negative.   Cardiovascular: Negative.     Per HPI unless specifically indicated above     Objective:    BP 136/68 (BP Location: Left Arm)   Pulse 62   Wt 157 lb (71.2 kg)   SpO2 97%   BMI 26.48 kg/m   Wt Readings from Last 3 Encounters:  05/21/17 157 lb (71.2 kg)  04/20/17 174 lb (78.9 kg)  10/16/16 173 lb (78.5 kg)    Physical Exam  Constitutional: He is oriented to person, place, and time. He appears well-developed and well-nourished.  HENT:  Head: Normocephalic and atraumatic.  Eyes: Conjunctivae and EOM are normal.  Neck: Normal range of motion.  Cardiovascular: Normal rate, regular rhythm and normal heart sounds.   Pulmonary/Chest: Effort normal and breath sounds normal.  Musculoskeletal: Normal range of motion.  Neurological: He is alert and oriented to person, place, and time.  Skin: No erythema.  Psychiatric: He has a normal mood and affect. His behavior is normal. Judgment and thought content normal.    Results for orders placed or performed in visit on 04/20/17  Microscopic Examination  Result Value Ref Range   WBC, UA  None seen 0 - 5 /hpf   RBC, UA None seen 0 - 2 /hpf   Epithelial Cells (non renal) CANCELED    Bacteria, UA None seen None seen/Few  CBC with Differential/Platelet  Result Value Ref Range   WBC 5.4 3.4 - 10.8 x10E3/uL   RBC 4.38 4.14 - 5.80 x10E6/uL   Hemoglobin 12.9 (L) 13.0 - 17.7 g/dL   Hematocrit 38.4 37.5 - 51.0 %   MCV 88 79 - 97 fL   MCH 29.5 26.6 - 33.0 pg   MCHC 33.6 31.5 - 35.7 g/dL   RDW 13.7 12.3 - 15.4 %   Platelets 183 150 - 379 x10E3/uL   Neutrophils 65 Not Estab. %   Lymphs 24 Not Estab. %   Monocytes 9 Not Estab. %   Eos 2 Not Estab. %   Basos 0 Not Estab. %   Neutrophils Absolute 3.5 1.4 - 7.0 x10E3/uL   Lymphocytes Absolute 1.3 0.7 - 3.1 x10E3/uL   Monocytes Absolute 0.5 0.1 - 0.9 x10E3/uL   EOS (ABSOLUTE) 0.1 0.0 - 0.4 x10E3/uL   Basophils Absolute 0.0 0.0 - 0.2 x10E3/uL   Immature Granulocytes 0 Not Estab. %   Immature Grans (Abs) 0.0 0.0 - 0.1 x10E3/uL  Comprehensive metabolic panel  Result Value Ref Range   Glucose 98 65 - 99 mg/dL   BUN 15 8 - 27 mg/dL   Creatinine, Ser 1.41 (H) 0.76 - 1.27  mg/dL   GFR calc non Af Amer 50 (L) >59 mL/min/1.73   GFR calc Af Amer 58 (L) >59 mL/min/1.73   BUN/Creatinine Ratio 11 10 - 24   Sodium 142 134 - 144 mmol/L   Potassium 4.6 3.5 - 5.2 mmol/L   Chloride 103 96 - 106 mmol/L   CO2 24 18 - 29 mmol/L   Calcium 9.3 8.6 - 10.2 mg/dL   Total Protein 6.9 6.0 - 8.5 g/dL   Albumin 4.2 3.5 - 4.8 g/dL   Globulin, Total 2.7 1.5 - 4.5 g/dL   Albumin/Globulin Ratio 1.6 1.2 - 2.2   Bilirubin Total 0.5 0.0 - 1.2 mg/dL   Alkaline Phosphatase 27 (L) 39 - 117 IU/L   AST 19 0 - 40 IU/L   ALT 23 0 - 44 IU/L  Lipid panel  Result Value Ref Range   Cholesterol, Total 238 (H) 100 - 199 mg/dL   Triglycerides 225 (H) 0 - 149 mg/dL   HDL 35 (L) >39 mg/dL   VLDL Cholesterol Cal 45 (H) 5 - 40 mg/dL   LDL Calculated 158 (H) 0 - 99 mg/dL   Chol/HDL Ratio 6.8 (H) 0.0 - 5.0 ratio  PSA  Result Value Ref Range   Prostate Specific Ag,  Serum 7.0 (H) 0.0 - 4.0 ng/mL  TSH  Result Value Ref Range   TSH 1.680 0.450 - 4.500 uIU/mL  Urinalysis, Routine w reflex microscopic  Result Value Ref Range   Specific Gravity, UA 1.025 1.005 - 1.030   pH, UA 6.5 5.0 - 7.5   Color, UA Yellow Yellow   Appearance Ur Clear Clear   Leukocytes, UA Negative Negative   Protein, UA Negative Negative/Trace   Glucose, UA Negative Negative   Ketones, UA Negative Negative   RBC, UA Negative Negative   Bilirubin, UA Negative Negative   Urobilinogen, Ur 0.2 0.2 - 1.0 mg/dL   Nitrite, UA Negative Negative   Microscopic Examination See below:       Assessment & Plan:   Problem List Items Addressed This Visit      Cardiovascular and Mediastinum   Essential hypertension    The current medical regimen is effective;  continue present plan and medications.         Other   Bipolar disorder ([place]) - Primary    Discuss mood and affect energy will stay at lower dose risperidone was it's helped energy and sleepiness. After further discussion originally felt about adding Lamictal but after further review of medications and discussion will leave medication alone is patient's already on 4 different agents.       Other Visit Diagnoses    Medication management           Follow up plan: Return in about 4 months (around 09/21/2017) for med check, BMP.

## 2017-05-21 NOTE — Assessment & Plan Note (Addendum)
Discuss mood and affect energy will stay at lower dose risperidone was it's helped energy and sleepiness. After further discussion originally felt about adding Lamictal but after further review of medications and discussion will leave medication alone is patient's already on 4 different agents.

## 2017-09-23 ENCOUNTER — Ambulatory Visit: Payer: Medicare Other | Admitting: Family Medicine

## 2017-10-12 ENCOUNTER — Ambulatory Visit: Payer: Medicare Other | Admitting: Family Medicine

## 2018-04-07 ENCOUNTER — Ambulatory Visit (INDEPENDENT_AMBULATORY_CARE_PROVIDER_SITE_OTHER): Payer: Medicare Other

## 2018-04-07 VITALS — BP 152/76 | HR 66 | Temp 97.5°F | Resp 16 | Ht 64.0 in | Wt 185.6 lb

## 2018-04-07 DIAGNOSIS — Z0000 Encounter for general adult medical examination without abnormal findings: Secondary | ICD-10-CM | POA: Diagnosis not present

## 2018-04-07 NOTE — Patient Instructions (Signed)
Dean Larsen , Thank you for taking time to come for your Medicare Wellness Visit. I appreciate your ongoing commitment to your health goals. Please review the following plan we discussed and let me know if I can assist you in the future.   Screening recommendations/referrals: Colonoscopy: completed 06/09/2012 Recommended yearly ophthalmology/optometry visit for glaucoma screening and checkup Recommended yearly dental visit for hygiene and checkup  Vaccinations: Influenza vaccine: up to date Pneumococcal vaccine: up to date  Tdap vaccine: up to date Shingles vaccine: eligible, check with your insurance company for coverage     Advanced directives: Advance directive discussed with you today. I have provided a copy for you to complete at home and have notarized. Once this is complete please bring a copy in to our office so we can scan it into your chart.  Conditions/risks identified: Tobacco cessation discussed- declined being ready to quit at this time.   Next appointment: Follow up with Dr.Crissman around 04/22/2018 for your annual physical. Follow up in one year for your annual wellness exam.   Preventive Care 65 Years and Older, Male Preventive care refers to lifestyle choices and visits with your health care provider that can promote health and wellness. What does preventive care include?  A yearly physical exam. This is also called an annual well check.  Dental exams once or twice a year.  Routine eye exams. Ask your health care provider how often you should have your eyes checked.  Personal lifestyle choices, including:  Daily care of your teeth and gums.  Regular physical activity.  Eating a healthy diet.  Avoiding tobacco and drug use.  Limiting alcohol use.  Practicing safe sex.  Taking low doses of aspirin every day.  Taking vitamin and mineral supplements as recommended by your health care provider. What happens during an annual well check? The services and  screenings done by your health care provider during your annual well check will depend on your age, overall health, lifestyle risk factors, and family history of disease. Counseling  Your health care provider may ask you questions about your:  Alcohol use.  Tobacco use.  Drug use.  Emotional well-being.  Home and relationship well-being.  Sexual activity.  Eating habits.  History of falls.  Memory and ability to understand (cognition).  Work and work Statistician. Screening  You may have the following tests or measurements:  Height, weight, and BMI.  Blood pressure.  Lipid and cholesterol levels. These may be checked every 5 years, or more frequently if you are over 33 years old.  Skin check.  Lung cancer screening. You may have this screening every year starting at age 78 if you have a 30-pack-year history of smoking and currently smoke or have quit within the past 15 years.  Fecal occult blood test (FOBT) of the stool. You may have this test every year starting at age 63.  Flexible sigmoidoscopy or colonoscopy. You may have a sigmoidoscopy every 5 years or a colonoscopy every 10 years starting at age 56.  Prostate cancer screening. Recommendations will vary depending on your family history and other risks.  Hepatitis C blood test.  Hepatitis B blood test.  Sexually transmitted disease (STD) testing.  Diabetes screening. This is done by checking your blood sugar (glucose) after you have not eaten for a while (fasting). You may have this done every 1-3 years.  Abdominal aortic aneurysm (AAA) screening. You may need this if you are a current or former smoker.  Osteoporosis. You may be screened  starting at age 57 if you are at high risk. Talk with your health care provider about your test results, treatment options, and if necessary, the need for more tests. Vaccines  Your health care provider may recommend certain vaccines, such as:  Influenza vaccine. This is  recommended every year.  Tetanus, diphtheria, and acellular pertussis (Tdap, Td) vaccine. You may need a Td booster every 10 years.  Zoster vaccine. You may need this after age 22.  Pneumococcal 13-valent conjugate (PCV13) vaccine. One dose is recommended after age 28.  Pneumococcal polysaccharide (PPSV23) vaccine. One dose is recommended after age 8. Talk to your health care provider about which screenings and vaccines you need and how often you need them. This information is not intended to replace advice given to you by your health care provider. Make sure you discuss any questions you have with your health care provider. Document Released: 01/11/2016 Document Revised: 09/03/2016 Document Reviewed: 10/16/2015 Elsevier Interactive Patient Education  2017 [employer] Prevention in the Home Falls can cause injuries. They can happen to people of all ages. There are many things you can do to make your home safe and to help prevent falls. What can I do on the outside of my home?  Regularly fix the edges of walkways and driveways and fix any cracks.  Remove anything that might make you trip as you walk through a door, such as a raised step or threshold.  Trim any bushes or trees on the path to your home.  Use bright outdoor lighting.  Clear any walking paths of anything that might make someone trip, such as rocks or tools.  Regularly check to see if handrails are loose or broken. Make sure that both sides of any steps have handrails.  Any raised decks and porches should have guardrails on the edges.  Have any leaves, snow, or ice cleared regularly.  Use sand or salt on walking paths during winter.  Clean up any spills in your garage right away. This includes oil or grease spills. What can I do in the bathroom?  Use night lights.  Install grab bars by the toilet and in the tub and shower. Do not use towel bars as grab bars.  Use non-skid mats or decals in the tub or  shower.  If you need to sit down in the shower, use a plastic, non-slip stool.  Keep the floor dry. Clean up any water that spills on the floor as soon as it happens.  Remove soap buildup in the tub or shower regularly.  Attach bath mats securely with double-sided non-slip rug tape.  Do not have throw rugs and other things on the floor that can make you trip. What can I do in the bedroom?  Use night lights.  Make sure that you have a light by your bed that is easy to reach.  Do not use any sheets or blankets that are too big for your bed. They should not hang down onto the floor.  Have a firm chair that has side arms. You can use this for support while you get dressed.  Do not have throw rugs and other things on the floor that can make you trip. What can I do in the kitchen?  Clean up any spills right away.  Avoid walking on wet floors.  Keep items that you use a lot in easy-to-reach places.  If you need to reach something above you, use a strong step stool that has a grab  bar.  Keep electrical cords out of the way.  Do not use floor polish or wax that makes floors slippery. If you must use wax, use non-skid floor wax.  Do not have throw rugs and other things on the floor that can make you trip. What can I do with my stairs?  Do not leave any items on the stairs.  Make sure that there are handrails on both sides of the stairs and use them. Fix handrails that are broken or loose. Make sure that handrails are as long as the stairways.  Check any carpeting to make sure that it is firmly attached to the stairs. Fix any carpet that is loose or worn.  Avoid having throw rugs at the top or bottom of the stairs. If you do have throw rugs, attach them to the floor with carpet tape.  Make sure that you have a light switch at the top of the stairs and the bottom of the stairs. If you do not have them, ask someone to add them for you. What else can I do to help prevent  falls?  Wear shoes that:  Do not have high heels.  Have rubber bottoms.  Are comfortable and fit you well.  Are closed at the toe. Do not wear sandals.  If you use a stepladder:  Make sure that it is fully opened. Do not climb a closed stepladder.  Make sure that both sides of the stepladder are locked into place.  Ask someone to hold it for you, if possible.  Clearly mark and make sure that you can see:  Any grab bars or handrails.  First and last steps.  Where the edge of each step is.  Use tools that help you move around (mobility aids) if they are needed. These include:  Canes.  Walkers.  Scooters.  Crutches.  Turn on the lights when you go into a dark area. Replace any light bulbs as soon as they burn out.  Set up your furniture so you have a clear path. Avoid moving your furniture around.  If any of your floors are uneven, fix them.  If there are any pets around you, be aware of where they are.  Review your medicines with your doctor. Some medicines can make you feel dizzy. This can increase your chance of falling. Ask your doctor what other things that you can do to help prevent falls. This information is not intended to replace advice given to you by your health care provider. Make sure you discuss any questions you have with your health care provider. Document Released: 10/11/2009 Document Revised: 05/22/2016 Document Reviewed: 01/19/2015 Elsevier Interactive Patient Education  2017 [employer].

## 2018-04-07 NOTE — Progress Notes (Signed)
Subjective:   Dean Larsen is a 72 y.o. male who presents for Medicare Annual/Subsequent preventive examination.  Review of Systems:   Cardiac Risk Factors include: obesity (BMI >30kg/m2);advanced age (>48men, >31 women);hypertension     Objective:    Vitals: BP (!) 152/76 (BP Location: Left Arm, Patient Position: Sitting)   Pulse 66   Temp (!) 97.5 F (36.4 C) (Temporal)   Resp 16   Ht 5\' 4"  (1.626 m)   Wt 185 lb 9.6 oz (84.2 kg)   BMI 31.86 kg/m   Body mass index is 31.86 kg/m.  Advanced Directives 04/07/2018 05/23/2016  Does Patient Have a Medical Advance Directive? No No  Would patient like information on creating a medical advance directive? Yes (MAU/Ambulatory/Procedural Areas - Information given) -    Tobacco Social History   Tobacco Use  Smoking Status Former Smoker  . Last attempt to quit: 12/29/1978  . Years since quitting: 39.2  Smokeless Tobacco Current User  . Types: Snuff     Ready to quit: No Counseling given: Yes   Clinical Intake:  Pre-visit preparation completed: Yes  Pain : No/denies pain     Nutritional Status: BMI > 30  Obese Nutritional Risks: None Diabetes: No  How often do you need to have someone help you when you read instructions, pamphlets, or other written materials from your doctor or pharmacy?: 1 - Never What is the last grade level you completed in school?: 11th grade  Interpreter Needed?: No  Information entered by :: Tiffany Hill,LPN   Past Medical History:  Diagnosis Date  . Arthritis   . Bipolar disorder ([place])   . CVA (cerebral vascular accident) [facility])    Age 58 - no deficits  . Diverticulitis of colon   . GERD (gastroesophageal reflux disease)   . Hyperlipidemia   . Iron deficiency anemia    Past Surgical History:  Procedure Laterality Date  . ESOPHAGOGASTRODUODENOSCOPY (EGD) WITH PROPOFOL N/A 05/23/2016   Procedure: ESOPHAGOGASTRODUODENOSCOPY (EGD) WITH PROPOFOL with dialation;  Surgeon: Lucilla Lame, MD;   Location: [facility];  Service: Endoscopy;  Laterality: N/A;  . HERNIA REPAIR    . ORTHOPEDIC SURGERY     Surgery for broken leg and toe   Family History  Problem Relation Age of Onset  . Stroke Father   . Diabetes Brother    Social History   Socioeconomic History  . Marital status: Married    Spouse name: Not on file  . Number of children: Not on file  . Years of education: Not on file  . Highest education level: Not on file  Occupational History  . Not on file  Social Needs  . Financial resource strain: Not hard at all  . Food insecurity:    Worry: Never true    Inability: Never true  . Transportation needs:    Medical: No    Non-medical: No  Tobacco Use  . Smoking status: Former Smoker    Last attempt to quit: 12/29/1978    Years since quitting: 39.2  . Smokeless tobacco: Current User    Types: Snuff  Substance and Sexual Activity  . Alcohol use: No    Alcohol/week: 0.0 oz  . Drug use: No  . Sexual activity: Not on file  Lifestyle  . Physical activity:    Days per week: 0 days    Minutes per session: 0 min  . Stress: Not at all  Relationships  . Social connections:    Talks on phone:  Once a week    Gets together: Once a week    Attends religious service: More than 4 times per year    Active member of club or organization: No    Attends meetings of clubs or organizations: Never    Relationship status: Married  Other Topics Concern  . Not on file  Social History Narrative  . Not on file    Outpatient Encounter Medications as of 04/07/2018  Medication Sig  . aspirin 81 MG tablet Take 81 mg by mouth daily.  Marland Kitchen buPROPion (WELLBUTRIN XL) 300 MG 24 hr tablet Take 1 tablet (300 mg total) by mouth daily.  . Choline Fenofibrate (TRILIPIX) 135 MG capsule Take 1 capsule (135 mg total) by mouth daily.  . divalproex (DEPAKOTE ER) 250 MG 24 hr tablet Take 1 tablet (250 mg total) by mouth 2 (two) times daily.  . DULoxetine (CYMBALTA) 30 MG capsule Take 1  capsule (30 mg total) by mouth daily.  Marland Kitchen lisinopril (PRINIVIL,ZESTRIL) 20 MG tablet Take 1 tablet (20 mg total) by mouth daily.  . risperiDONE (RISPERDAL) 2 MG tablet Take 1 tablet (2 mg total) by mouth daily.   No facility-administered encounter medications on file as of 04/07/2018.     Activities of Daily Living In your present state of health, do you have any difficulty performing the following activities: 04/07/2018  Hearing? Y  Vision? Y  Difficulty concentrating or making decisions? N  Walking or climbing stairs? N  Dressing or bathing? N  Doing errands, shopping? N  Preparing Food and eating ? N  Using the Toilet? N  In the past six months, have you accidently leaked urine? N  Do you have problems with loss of bowel control? N  Managing your Medications? N  Managing your Finances? Y  Comment daughter and wife assists   Housekeeping or managing your Housekeeping? N  Some recent data might be hidden    Patient Care Team: Guadalupe Maple, MD as PCP - General (Family Medicine)   Assessment:   This is a routine wellness examination for Dean Larsen.  Exercise Activities and Dietary recommendations Current Exercise Habits: The patient does not participate in regular exercise at present, Exercise limited by: None identified  Goals    . Quit Smoking     Tobacco cessation discussed       Fall Risk Fall Risk  04/07/2018 05/21/2017 04/20/2017 04/16/2016  Falls in the past year? No No No No   Is the patient's home free of loose throw rugs in walkways, pet beds, electrical cords, etc?   yes      Grab bars in the bathroom? yes      Handrails on the stairs?   no stairs      Adequate lighting?   yes  Timed Get Up and Go Performed: Completed in 9 seconds with no use of assistive devices, steady gait. No intervention needed at this time.   Depression Screen PHQ 2/9 Scores 04/07/2018 04/20/2017 04/16/2016  PHQ - 2 Score 0 0 1    Cognitive Function     6CIT Screen 04/07/2018  What  Year? 0 points  What month? 0 points  What time? 0 points  Count back from 20 0 points  Months in reverse 0 points  Repeat phrase 10 points  Total Score 10    Immunization History  Administered Date(s) Administered  . Influenza, High Dose Seasonal PF 10/16/2016  . Influenza,inj,Quad PF,6+ Mos 10/02/2014, 09/27/2015  . Influenza-Unspecified 01/16/2018  . Pneumococcal  Conjugate-13 03/15/2015  . Pneumococcal Polysaccharide-23 04/16/2016  . Tdap 10/04/2008    Qualifies for Shingles Vaccine? Yes, discussed shingrix vaccine.   Screening Tests Health Maintenance  Topic Date Due  . INFLUENZA VACCINE  07/29/2018  . TETANUS/TDAP  10/04/2018  . COLONOSCOPY  06/09/2022  . Hepatitis C Screening  Completed  . PNA vac Low Risk Adult  Completed   Cancer Screenings: Lung: Low Dose CT Chest recommended if Age 66-80 years, 30 pack-year currently smoking OR have quit w/in 15years. Patient does not qualify. Colorectal: completed 06/09/2012  Additional Screenings:  Hepatitis C Screening: completed 04/16/2016      Plan:    I have personally reviewed and addressed the Medicare Annual Wellness questionnaire and have noted the following in the patient's chart:  A. Medical and social history B. Use of alcohol, tobacco or illicit drugs  C. Current medications and supplements D. Functional ability and status E.  Nutritional status F.  Physical activity G. Advance directives H. List of other physicians I.  Hospitalizations, surgeries, and ER visits in previous 12 months J.  [place] such as hearing and vision if needed, cognitive and depression L. Referrals and appointments   In addition, I have reviewed and discussed with patient certain preventive protocols, quality metrics, and best practice recommendations. A written personalized care plan for preventive services as well as general preventive health recommendations were provided to patient.   Signed,  Tyler Aas, LPN Nurse  Health Advisor   Nurse Notes: none

## 2018-05-05 ENCOUNTER — Encounter: Payer: Self-pay | Admitting: Family Medicine

## 2018-05-05 ENCOUNTER — Ambulatory Visit (INDEPENDENT_AMBULATORY_CARE_PROVIDER_SITE_OTHER): Payer: Medicare Other | Admitting: Family Medicine

## 2018-05-05 DIAGNOSIS — N183 Chronic kidney disease, stage 3 (moderate): Secondary | ICD-10-CM | POA: Diagnosis not present

## 2018-05-05 DIAGNOSIS — R519 Headache, unspecified: Secondary | ICD-10-CM | POA: Insufficient documentation

## 2018-05-05 DIAGNOSIS — L03119 Cellulitis of unspecified part of limb: Secondary | ICD-10-CM

## 2018-05-05 DIAGNOSIS — R51 Headache: Secondary | ICD-10-CM

## 2018-05-05 DIAGNOSIS — E785 Hyperlipidemia, unspecified: Secondary | ICD-10-CM

## 2018-05-05 DIAGNOSIS — L02419 Cutaneous abscess of limb, unspecified: Secondary | ICD-10-CM | POA: Diagnosis not present

## 2018-05-05 DIAGNOSIS — F3170 Bipolar disorder, currently in remission, most recent episode unspecified: Secondary | ICD-10-CM | POA: Diagnosis not present

## 2018-05-05 DIAGNOSIS — I129 Hypertensive chronic kidney disease with stage 1 through stage 4 chronic kidney disease, or unspecified chronic kidney disease: Secondary | ICD-10-CM

## 2018-05-05 MED ORDER — CHOLINE FENOFIBRATE 135 MG PO CPDR: 135.0000 mg | DELAYED_RELEASE_CAPSULE | Freq: Every day | ORAL | 4 refills | Status: DC

## 2018-05-05 MED ORDER — DIVALPROEX SODIUM ER 250 MG PO TB24: 250.0000 mg | ORAL_TABLET | Freq: Two times a day (BID) | ORAL | 4 refills | Status: DC

## 2018-05-05 MED ORDER — RISPERIDONE 2 MG PO TABS: 2.0000 mg | ORAL_TABLET | Freq: Every day | ORAL | 3 refills | Status: DC

## 2018-05-05 MED ORDER — SULFAMETHOXAZOLE-TRIMETHOPRIM 800-160 MG PO TABS: 1.0000 | ORAL_TABLET | Freq: Two times a day (BID) | ORAL | 0 refills | Status: DC

## 2018-05-05 MED ORDER — BUPROPION HCL ER (XL) 300 MG PO TB24: 300.0000 mg | ORAL_TABLET | Freq: Every day | ORAL | 4 refills | Status: DC

## 2018-05-05 MED ORDER — LISINOPRIL 20 MG PO TABS: 20.0000 mg | ORAL_TABLET | Freq: Every day | ORAL | 4 refills | Status: DC

## 2018-05-05 MED ORDER — DULOXETINE HCL 30 MG PO CPEP: 30.0000 mg | ORAL_CAPSULE | Freq: Every day | ORAL | 4 refills | Status: DC

## 2018-05-05 NOTE — Assessment & Plan Note (Signed)
Cellulitis changes of right medial elbow area we will treat with sulfa drug twice daily for 10 days discussed hot compresses how to do.

## 2018-05-05 NOTE — Progress Notes (Signed)
BP (!) 156/68   Pulse 70   Ht 5\' 4"  (1.626 m)   Wt 184 lb (83.5 kg)   SpO2 97%   BMI 31.58 kg/m    Subjective:    Patient ID: Dean Larsen, male    DOB: 09-Aug-1946, 72 y.o.   MRN: [id number]  HPI: Dean Larsen is a 72 y.o. male  Chief Complaint  Patient presents with  . Follow-up  . Medication Management  . Headache  . Elbow Injury  Patient accompanied by his daughter who provides most of the history. Patient's headache starts in his posterior neck midline area radiates up to his forehead.  Sometimes is throbbing wakes him up mostly in the morning.  Pillow is not too thick to take some Tylenol or Advil which seems to help and headache goes away by mid morning. No other specific complaints regarding headache or upper respiratory tract. Patient does have some redness medial elbow area with some puffiness of his skin indicating some cellulitis changes.  Relevant past medical, surgical, family and social history reviewed and updated as indicated. Interim medical history since our last visit reviewed. Allergies and medications reviewed and updated.  Review of Systems  Constitutional: Negative.   Respiratory: Negative.   Cardiovascular: Negative.     Per HPI unless specifically indicated above     Objective:    BP (!) 156/68   Pulse 70   Ht 5\' 4"  (1.626 m)   Wt 184 lb (83.5 kg)   SpO2 97%   BMI 31.58 kg/m   Wt Readings from Last 3 Encounters:  05/05/18 184 lb (83.5 kg)  04/07/18 185 lb 9.6 oz (84.2 kg)  05/21/17 157 lb (71.2 kg)    Physical Exam  Constitutional: He is oriented to person, place, and time. He appears well-developed and well-nourished.  HENT:  Head: Normocephalic and atraumatic.  Eyes: Conjunctivae and EOM are normal.  Neck: Normal range of motion.  Cardiovascular: Normal rate, regular rhythm and normal heart sounds.  Pulmonary/Chest: Effort normal and breath sounds normal.  Musculoskeletal: Normal range of motion.  Neurological: He is  alert and oriented to person, place, and time.  Skin: No erythema.  Patch of swelling and erythremia middle right elbow  Psychiatric: He has a normal mood and affect. His behavior is normal. Judgment and thought content normal.    Results for orders placed or performed in visit on 04/20/17  Microscopic Examination  Result Value Ref Range   WBC, UA None seen 0 - 5 /hpf   RBC, UA None seen 0 - 2 /hpf   Epithelial Cells (non renal) CANCELED    Bacteria, UA None seen None seen/Few  CBC with Differential/Platelet  Result Value Ref Range   WBC 5.4 3.4 - 10.8 x10E3/uL   RBC 4.38 4.14 - 5.80 x10E6/uL   Hemoglobin 12.9 (L) 13.0 - 17.7 g/dL   Hematocrit 38.4 37.5 - 51.0 %   MCV 88 79 - 97 fL   MCH 29.5 26.6 - 33.0 pg   MCHC 33.6 31.5 - 35.7 g/dL   RDW 13.7 12.3 - 15.4 %   Platelets 183 150 - 379 x10E3/uL   Neutrophils 65 Not Estab. %   Lymphs 24 Not Estab. %   Monocytes 9 Not Estab. %   Eos 2 Not Estab. %   Basos 0 Not Estab. %   Neutrophils Absolute 3.5 1.4 - 7.0 x10E3/uL   Lymphocytes Absolute 1.3 0.7 - 3.1 x10E3/uL   Monocytes Absolute 0.5 0.1 -  0.9 x10E3/uL   EOS (ABSOLUTE) 0.1 0.0 - 0.4 x10E3/uL   Basophils Absolute 0.0 0.0 - 0.2 x10E3/uL   Immature Granulocytes 0 Not Estab. %   Immature Grans (Abs) 0.0 0.0 - 0.1 x10E3/uL  Comprehensive metabolic panel  Result Value Ref Range   Glucose 98 65 - 99 mg/dL   BUN 15 8 - 27 mg/dL   Creatinine, Ser 1.41 (H) 0.76 - 1.27 mg/dL   GFR calc non Af Amer 50 (L) >59 mL/min/1.73   GFR calc Af Amer 58 (L) >59 mL/min/1.73   BUN/Creatinine Ratio 11 10 - 24   Sodium 142 134 - 144 mmol/L   Potassium 4.6 3.5 - 5.2 mmol/L   Chloride 103 96 - 106 mmol/L   CO2 24 18 - 29 mmol/L   Calcium 9.3 8.6 - 10.2 mg/dL   Total Protein 6.9 6.0 - 8.5 g/dL   Albumin 4.2 3.5 - 4.8 g/dL   Globulin, Total 2.7 1.5 - 4.5 g/dL   Albumin/Globulin Ratio 1.6 1.2 - 2.2   Bilirubin Total 0.5 0.0 - 1.2 mg/dL   Alkaline Phosphatase 27 (L) 39 - 117 IU/L   AST 19 0 - 40  IU/L   ALT 23 0 - 44 IU/L  Lipid panel  Result Value Ref Range   Cholesterol, Total 238 (H) 100 - 199 mg/dL   Triglycerides 225 (H) 0 - 149 mg/dL   HDL 35 (L) >39 mg/dL   VLDL Cholesterol Cal 45 (H) 5 - 40 mg/dL   LDL Calculated 158 (H) 0 - 99 mg/dL   Chol/HDL Ratio 6.8 (H) 0.0 - 5.0 ratio  PSA  Result Value Ref Range   Prostate Specific Ag, Serum 7.0 (H) 0.0 - 4.0 ng/mL  TSH  Result Value Ref Range   TSH 1.680 0.450 - 4.500 uIU/mL  Urinalysis, Routine w reflex microscopic  Result Value Ref Range   Specific Gravity, UA 1.025 1.005 - 1.030   pH, UA 6.5 5.0 - 7.5   Color, UA Yellow Yellow   Appearance Ur Clear Clear   Leukocytes, UA Negative Negative   Protein, UA Negative Negative/Trace   Glucose, UA Negative Negative   Ketones, UA Negative Negative   RBC, UA Negative Negative   Bilirubin, UA Negative Negative   Urobilinogen, Ur 0.2 0.2 - 1.0 mg/dL   Nitrite, UA Negative Negative   Microscopic Examination See below:       Assessment & Plan:   Problem List Items Addressed This Visit      Genitourinary   Hypertensive kidney disease with CKD stage III (HCC)   Relevant Medications   lisinopril (PRINIVIL,ZESTRIL) 20 MG tablet     Other   Bipolar disorder (HCC)   Relevant Medications   risperiDONE (RISPERDAL) 2 MG tablet   DULoxetine (CYMBALTA) 30 MG capsule   divalproex (DEPAKOTE ER) 250 MG 24 hr tablet   buPROPion (WELLBUTRIN XL) 300 MG 24 hr tablet   Hyperlipidemia   Relevant Medications   lisinopril (PRINIVIL,ZESTRIL) 20 MG tablet   Choline Fenofibrate (TRILIPIX) 135 MG capsule   Headache    S headache possibly related to arthritis patient will get up and be active first thing in the morning when he does have his headaches may take Tylenol or Aleve.  Cautions because of previous history of low functioning kidneys.       Relevant Medications   DULoxetine (CYMBALTA) 30 MG capsule   divalproex (DEPAKOTE ER) 250 MG 24 hr tablet   buPROPion (WELLBUTRIN XL) 300 MG  24 hr tablet   Cellulitis and abscess of upper extremity    Cellulitis changes of right medial elbow area we will treat with sulfa drug twice daily for 10 days discussed hot compresses how to do.          Follow up plan: Return for As scheduled, Physical Exam.

## 2018-05-05 NOTE — Assessment & Plan Note (Signed)
S headache possibly related to arthritis patient will get up and be active first thing in the morning when he does have his headaches may take Tylenol or Aleve.  Cautions because of previous history of low functioning kidneys.

## 2018-06-01 ENCOUNTER — Encounter: Payer: Self-pay | Admitting: Family Medicine

## 2018-06-01 ENCOUNTER — Ambulatory Visit (INDEPENDENT_AMBULATORY_CARE_PROVIDER_SITE_OTHER): Payer: Medicare Other | Admitting: Family Medicine

## 2018-06-01 VITALS — BP 138/60 | HR 64 | Ht 64.0 in | Wt 182.0 lb

## 2018-06-01 DIAGNOSIS — N400 Enlarged prostate without lower urinary tract symptoms: Secondary | ICD-10-CM | POA: Diagnosis not present

## 2018-06-01 DIAGNOSIS — Z7189 Other specified counseling: Secondary | ICD-10-CM | POA: Diagnosis not present

## 2018-06-01 DIAGNOSIS — I10 Essential (primary) hypertension: Secondary | ICD-10-CM

## 2018-06-01 DIAGNOSIS — I639 Cerebral infarction, unspecified: Secondary | ICD-10-CM | POA: Diagnosis not present

## 2018-06-01 DIAGNOSIS — R51 Headache: Secondary | ICD-10-CM | POA: Diagnosis not present

## 2018-06-01 DIAGNOSIS — F3170 Bipolar disorder, currently in remission, most recent episode unspecified: Secondary | ICD-10-CM | POA: Diagnosis not present

## 2018-06-01 DIAGNOSIS — R519 Headache, unspecified: Secondary | ICD-10-CM

## 2018-06-01 DIAGNOSIS — Z0000 Encounter for general adult medical examination without abnormal findings: Secondary | ICD-10-CM | POA: Diagnosis not present

## 2018-06-01 NOTE — Progress Notes (Signed)
BP 138/60 (BP Location: Left Arm)   Pulse 64   Ht 5\' 4"  (1.626 m)   Wt 182 lb (82.6 kg)   SpO2 97%   BMI 31.24 kg/m    Subjective:    Patient ID: Dean Larsen, male    DOB: [birth year], 72 y.o.   MRN: [id number]  HPI: Dean Larsen is a 72 y.o. male  Chief Complaint  Patient presents with  . Annual Exam    Saw Tiffany in April  . Headache    Worsening, pain across forhead, around head and down neck.   Patient with complaints of worsening headache as noted above has sometimes a throbbing type headache more across the top of his scalp radiating to the back.  This is been getting more frequent and more common as requiring more than Tylenol to control his symptoms Aleve has helped the most.  No other associated symptoms of neurovascular tight nature.  No neurovascular deficits reported.   Relevant past medical, surgical, family and social history reviewed and updated as indicated. Interim medical history since our last visit reviewed. Allergies and medications reviewed and updated.  Review of Systems  Constitutional: Negative.   HENT: Negative.   Eyes: Negative.   Respiratory: Negative.   Cardiovascular: Negative.   Gastrointestinal: Negative.   Endocrine: Negative.   Genitourinary: Negative.   Musculoskeletal: Negative.   Skin: Negative.   Allergic/Immunologic: Negative.   Neurological: Negative.   Hematological: Negative.   Psychiatric/Behavioral: Negative.     Per HPI unless specifically indicated above     Objective:    BP 138/60 (BP Location: Left Arm)   Pulse 64   Ht 5\' 4"  (1.626 m)   Wt 182 lb (82.6 kg)   SpO2 97%   BMI 31.24 kg/m   Wt Readings from Last 3 Encounters:  06/01/18 182 lb (82.6 kg)  05/05/18 184 lb (83.5 kg)  04/07/18 185 lb 9.6 oz (84.2 kg)    Physical Exam  Constitutional: He is oriented to person, place, and time. He appears well-developed and well-nourished.  HENT:  Head: Normocephalic and atraumatic.  Right Ear: External ear  normal.  Left Ear: External ear normal.  Eyes: Pupils are equal, round, and reactive to light. Conjunctivae and EOM are normal.  Neck: Normal range of motion. Neck supple.  Cardiovascular: Normal rate, regular rhythm, normal heart sounds and intact distal pulses.  Pulmonary/Chest: Effort normal and breath sounds normal.  Abdominal: Soft. Bowel sounds are normal. There is no splenomegaly or hepatomegaly.  Genitourinary: Rectum normal, prostate normal and penis normal.  Musculoskeletal: Normal range of motion.  Neurological: He is alert and oriented to person, place, and time. He has normal reflexes.  Skin: No rash noted. No erythema.  Psychiatric: He has a normal mood and affect. His behavior is normal. Judgment and thought content normal.    Results for orders placed or performed in visit on 04/20/17  Microscopic Examination  Result Value Ref Range   WBC, UA None seen 0 - 5 /hpf   RBC, UA None seen 0 - 2 /hpf   Epithelial Cells (non renal) CANCELED    Bacteria, UA None seen None seen/Few  CBC with Differential/Platelet  Result Value Ref Range   WBC 5.4 3.4 - 10.8 x10E3/uL   RBC 4.38 4.14 - 5.80 x10E6/uL   Hemoglobin 12.9 (L) 13.0 - 17.7 g/dL   Hematocrit 38.4 37.5 - 51.0 %   MCV 88 79 - 97 fL   MCH 29.5 26.6 -  33.0 pg   MCHC 33.6 31.5 - 35.7 g/dL   RDW 13.7 12.3 - 15.4 %   Platelets 183 150 - 379 x10E3/uL   Neutrophils 65 Not Estab. %   Lymphs 24 Not Estab. %   Monocytes 9 Not Estab. %   Eos 2 Not Estab. %   Basos 0 Not Estab. %   Neutrophils Absolute 3.5 1.4 - 7.0 x10E3/uL   Lymphocytes Absolute 1.3 0.7 - 3.1 x10E3/uL   Monocytes Absolute 0.5 0.1 - 0.9 x10E3/uL   EOS (ABSOLUTE) 0.1 0.0 - 0.4 x10E3/uL   Basophils Absolute 0.0 0.0 - 0.2 x10E3/uL   Immature Granulocytes 0 Not Estab. %   Immature Grans (Abs) 0.0 0.0 - 0.1 x10E3/uL  Comprehensive metabolic panel  Result Value Ref Range   Glucose 98 65 - 99 mg/dL   BUN 15 8 - 27 mg/dL   Creatinine, Ser 1.41 (H) 0.76 - 1.27  mg/dL   GFR calc non Af Amer 50 (L) >59 mL/min/1.73   GFR calc Af Amer 58 (L) >59 mL/min/1.73   BUN/Creatinine Ratio 11 10 - 24   Sodium 142 134 - 144 mmol/L   Potassium 4.6 3.5 - 5.2 mmol/L   Chloride 103 96 - 106 mmol/L   CO2 24 18 - 29 mmol/L   Calcium 9.3 8.6 - 10.2 mg/dL   Total Protein 6.9 6.0 - 8.5 g/dL   Albumin 4.2 3.5 - 4.8 g/dL   Globulin, Total 2.7 1.5 - 4.5 g/dL   Albumin/Globulin Ratio 1.6 1.2 - 2.2   Bilirubin Total 0.5 0.0 - 1.2 mg/dL   Alkaline Phosphatase 27 (L) 39 - 117 IU/L   AST 19 0 - 40 IU/L   ALT 23 0 - 44 IU/L  Lipid panel  Result Value Ref Range   Cholesterol, Total 238 (H) 100 - 199 mg/dL   Triglycerides 225 (H) 0 - 149 mg/dL   HDL 35 (L) >39 mg/dL   VLDL Cholesterol Cal 45 (H) 5 - 40 mg/dL   LDL Calculated 158 (H) 0 - 99 mg/dL   Chol/HDL Ratio 6.8 (H) 0.0 - 5.0 ratio  PSA  Result Value Ref Range   Prostate Specific Ag, Serum 7.0 (H) 0.0 - 4.0 ng/mL  TSH  Result Value Ref Range   TSH 1.680 0.450 - 4.500 uIU/mL  Urinalysis, Routine w reflex microscopic  Result Value Ref Range   Specific Gravity, UA 1.025 1.005 - 1.030   pH, UA 6.5 5.0 - 7.5   Color, UA Yellow Yellow   Appearance Ur Clear Clear   Leukocytes, UA Negative Negative   Protein, UA Negative Negative/Trace   Glucose, UA Negative Negative   Ketones, UA Negative Negative   RBC, UA Negative Negative   Bilirubin, UA Negative Negative   Urobilinogen, Ur 0.2 0.2 - 1.0 mg/dL   Nitrite, UA Negative Negative   Microscopic Examination See below:       Assessment & Plan:   Problem List Items Addressed This Visit      Cardiovascular and Mediastinum   Essential hypertension    The current medical regimen is effective;  continue present plan and medications.       Relevant Orders   Comprehensive metabolic panel   Lipid panel   CBC with Differential/Platelet   TSH   Urinalysis, Routine w reflex microscopic   CVA (cerebral vascular accident) [facility])    The current medical regimen is  effective;  continue present plan and medications.       Relevant  Orders   Comprehensive metabolic panel   Lipid panel   CBC with Differential/Platelet   TSH   Urinalysis, Routine w reflex microscopic     Genitourinary   BPH (benign prostatic hyperplasia)    The current medical regimen is effective;  continue present plan and medications.       Relevant Orders   PSA     Other   Bipolar disorder ([place])    The current medical regimen is effective;  continue present plan and medications.       Relevant Orders   Comprehensive metabolic panel   CBC with Differential/Platelet   TSH   Headache    Ongoing chronic daily persistent headaches getting worse with normal neuro exam will refer for MRI. Unable to order MRI will refer to neurology to further evaluate headaches and determine any appropriate imaging studies.      Relevant Orders   Ambulatory referral to Neurology   Advanced care planning/counseling discussion - Primary       Follow up plan: Return in about 6 months (around 12/01/2018) for BMP.

## 2018-06-01 NOTE — Assessment & Plan Note (Signed)
The current medical regimen is effective;  continue present plan and medications.  

## 2018-06-01 NOTE — Assessment & Plan Note (Addendum)
Ongoing chronic daily persistent headaches getting worse with normal neuro exam will refer for MRI. Unable to order MRI will refer to neurology to further evaluate headaches and determine any appropriate imaging studies.

## 2018-08-04 DIAGNOSIS — R51 Headache: Secondary | ICD-10-CM | POA: Diagnosis not present

## 2018-10-04 DIAGNOSIS — T84498A Other mechanical complication of other internal orthopedic devices, implants and grafts, initial encounter: Secondary | ICD-10-CM | POA: Diagnosis not present

## 2018-10-07 DIAGNOSIS — T84498A Other mechanical complication of other internal orthopedic devices, implants and grafts, initial encounter: Secondary | ICD-10-CM | POA: Diagnosis not present

## 2018-12-07 ENCOUNTER — Encounter: Payer: Self-pay | Admitting: Family Medicine

## 2018-12-07 ENCOUNTER — Ambulatory Visit (INDEPENDENT_AMBULATORY_CARE_PROVIDER_SITE_OTHER): Payer: Medicare Other | Admitting: Family Medicine

## 2018-12-07 VITALS — BP 136/70 | HR 64 | Temp 97.9°F | Wt 185.4 lb

## 2018-12-07 DIAGNOSIS — Z23 Encounter for immunization: Secondary | ICD-10-CM | POA: Diagnosis not present

## 2018-12-07 DIAGNOSIS — K222 Esophageal obstruction: Secondary | ICD-10-CM

## 2018-12-07 DIAGNOSIS — I10 Essential (primary) hypertension: Secondary | ICD-10-CM

## 2018-12-07 DIAGNOSIS — E7849 Other hyperlipidemia: Secondary | ICD-10-CM | POA: Diagnosis not present

## 2018-12-07 NOTE — Patient Instructions (Signed)

## 2018-12-07 NOTE — Assessment & Plan Note (Signed)
The current medical regimen is effective;  continue present plan and medications.  

## 2018-12-07 NOTE — Assessment & Plan Note (Signed)
Recurrent symptoms will refer to GI to further evaluate

## 2018-12-07 NOTE — Progress Notes (Signed)
BP 136/70   Pulse 64   Temp 97.9 F (36.6 C) (Oral)   Wt 185 lb 6.4 oz (84.1 kg)   SpO2 95%   BMI 31.82 kg/m    Subjective:    Patient ID: Dean Larsen, male    DOB: Feb 24, 1946, 72 y.o.   MRN: [id number]  HPI: Dean Larsen is a 72 y.o. male  Chief Complaint  Patient presents with  . Hypertension   Patient follow-up hypertension home readings been doing good also and repeat readings doing well here.  No complaints from lisinopril which she is taking faithfully. Nerve medications also doing well with good control. Also taking triglyceride medicine without problems.  Patient is accompanied by his daughter who assists with history. Patient also about ready to have esophageal dilation done again and is starting to have some dysphasia symptoms seemingly to get worse on chart reviewed last done in May 2017.  Relevant past medical, surgical, family and social history reviewed and updated as indicated. Interim medical history since our last visit reviewed. Allergies and medications reviewed and updated.  Review of Systems  Constitutional: Negative.   Respiratory: Negative.   Cardiovascular: Negative.     Per HPI unless specifically indicated above     Objective:    BP 136/70   Pulse 64   Temp 97.9 F (36.6 C) (Oral)   Wt 185 lb 6.4 oz (84.1 kg)   SpO2 95%   BMI 31.82 kg/m   Wt Readings from Last 3 Encounters:  12/07/18 185 lb 6.4 oz (84.1 kg)  06/01/18 182 lb (82.6 kg)  05/05/18 184 lb (83.5 kg)    Physical Exam  Constitutional: He is oriented to person, place, and time. He appears well-developed and well-nourished.  HENT:  Head: Normocephalic and atraumatic.  Eyes: Conjunctivae and EOM are normal.  Neck: Normal range of motion.  Cardiovascular: Normal rate, regular rhythm and normal heart sounds.  Pulmonary/Chest: Effort normal and breath sounds normal.  Musculoskeletal: Normal range of motion.  Neurological: He is alert and oriented to person, place, and  time.  Skin: No erythema.  Psychiatric: He has a normal mood and affect. His behavior is normal. Judgment and thought content normal.    Results for orders placed or performed in visit on 04/20/17  Microscopic Examination  Result Value Ref Range   WBC, UA None seen 0 - 5 /hpf   RBC, UA None seen 0 - 2 /hpf   Epithelial Cells (non renal) CANCELED    Bacteria, UA None seen None seen/Few  CBC with Differential/Platelet  Result Value Ref Range   WBC 5.4 3.4 - 10.8 x10E3/uL   RBC 4.38 4.14 - 5.80 x10E6/uL   Hemoglobin 12.9 (L) 13.0 - 17.7 g/dL   Hematocrit 38.4 37.5 - 51.0 %   MCV 88 79 - 97 fL   MCH 29.5 26.6 - 33.0 pg   MCHC 33.6 31.5 - 35.7 g/dL   RDW 13.7 12.3 - 15.4 %   Platelets 183 150 - 379 x10E3/uL   Neutrophils 65 Not Estab. %   Lymphs 24 Not Estab. %   Monocytes 9 Not Estab. %   Eos 2 Not Estab. %   Basos 0 Not Estab. %   Neutrophils Absolute 3.5 1.4 - 7.0 x10E3/uL   Lymphocytes Absolute 1.3 0.7 - 3.1 x10E3/uL   Monocytes Absolute 0.5 0.1 - 0.9 x10E3/uL   EOS (ABSOLUTE) 0.1 0.0 - 0.4 x10E3/uL   Basophils Absolute 0.0 0.0 - 0.2 x10E3/uL  Immature Granulocytes 0 Not Estab. %   Immature Grans (Abs) 0.0 0.0 - 0.1 x10E3/uL  Comprehensive metabolic panel  Result Value Ref Range   Glucose 98 65 - 99 mg/dL   BUN 15 8 - 27 mg/dL   Creatinine, Ser 1.41 (H) 0.76 - 1.27 mg/dL   GFR calc non Af Amer 50 (L) >59 mL/min/1.73   GFR calc Af Amer 58 (L) >59 mL/min/1.73   BUN/Creatinine Ratio 11 10 - 24   Sodium 142 134 - 144 mmol/L   Potassium 4.6 3.5 - 5.2 mmol/L   Chloride 103 96 - 106 mmol/L   CO2 24 18 - 29 mmol/L   Calcium 9.3 8.6 - 10.2 mg/dL   Total Protein 6.9 6.0 - 8.5 g/dL   Albumin 4.2 3.5 - 4.8 g/dL   Globulin, Total 2.7 1.5 - 4.5 g/dL   Albumin/Globulin Ratio 1.6 1.2 - 2.2   Bilirubin Total 0.5 0.0 - 1.2 mg/dL   Alkaline Phosphatase 27 (L) 39 - 117 IU/L   AST 19 0 - 40 IU/L   ALT 23 0 - 44 IU/L  Lipid panel  Result Value Ref Range   Cholesterol, Total 238  (H) 100 - 199 mg/dL   Triglycerides 225 (H) 0 - 149 mg/dL   HDL 35 (L) >39 mg/dL   VLDL Cholesterol Cal 45 (H) 5 - 40 mg/dL   LDL Calculated 158 (H) 0 - 99 mg/dL   Chol/HDL Ratio 6.8 (H) 0.0 - 5.0 ratio  PSA  Result Value Ref Range   Prostate Specific Ag, Serum 7.0 (H) 0.0 - 4.0 ng/mL  TSH  Result Value Ref Range   TSH 1.680 0.450 - 4.500 uIU/mL  Urinalysis, Routine w reflex microscopic  Result Value Ref Range   Specific Gravity, UA 1.025 1.005 - 1.030   pH, UA 6.5 5.0 - 7.5   Color, UA Yellow Yellow   Appearance Ur Clear Clear   Leukocytes, UA Negative Negative   Protein, UA Negative Negative/Trace   Glucose, UA Negative Negative   Ketones, UA Negative Negative   RBC, UA Negative Negative   Bilirubin, UA Negative Negative   Urobilinogen, Ur 0.2 0.2 - 1.0 mg/dL   Nitrite, UA Negative Negative   Microscopic Examination See below:       Assessment & Plan:   Problem List Items Addressed This Visit      Cardiovascular and Mediastinum   Essential hypertension - Primary    The current medical regimen is effective;  continue present plan and medications.       Relevant Orders   Basic metabolic panel     Digestive   Esophageal stenosis    Recurrent symptoms will refer to GI to further evaluate      Relevant Orders   Ambulatory referral to Gastroenterology     Other   Hyperlipidemia    The current medical regimen is effective;  continue present plan and medications.        Other Visit Diagnoses    Need for influenza vaccination       Relevant Orders   Flu vaccine HIGH DOSE PF (Completed)       Follow up plan: Return in about 6 months (around 06/08/2019) for Physical Exam.

## 2018-12-08 ENCOUNTER — Encounter: Payer: Self-pay | Admitting: Family Medicine

## 2018-12-08 LAB — BASIC METABOLIC PANEL
BUN / CREAT RATIO: 14 (ref 10–24)
BUN: 19 mg/dL (ref 8–27)
CHLORIDE: 102 mmol/L (ref 96–106)
CO2: 26 mmol/L (ref 20–29)
CREATININE: 1.37 mg/dL — AB (ref 0.76–1.27)
Calcium: 9.8 mg/dL (ref 8.6–10.2)
GFR calc Af Amer: 59 mL/min/{1.73_m2} — ABNORMAL LOW (ref >59)
GFR, EST NON AFRICAN AMERICAN: 51 mL/min/{1.73_m2} — AB (ref >59)
Glucose: 94 mg/dL (ref 65–99)
POTASSIUM: 4.6 mmol/L (ref 3.5–5.2)
Sodium: 144 mmol/L (ref 134–144)

## 2019-01-10 ENCOUNTER — Ambulatory Visit: Payer: Medicare Other | Admitting: Gastroenterology

## 2019-01-13 ENCOUNTER — Ambulatory Visit: Payer: Medicare Other | Admitting: Gastroenterology

## 2019-02-16 ENCOUNTER — Encounter: Payer: Self-pay | Admitting: Gastroenterology

## 2019-02-16 ENCOUNTER — Ambulatory Visit: Payer: Medicare Other | Admitting: Gastroenterology

## 2019-02-16 ENCOUNTER — Other Ambulatory Visit: Payer: Self-pay

## 2019-02-16 VITALS — BP 146/76 | HR 77 | Ht 64.0 in | Wt 185.2 lb

## 2019-02-16 DIAGNOSIS — K222 Esophageal obstruction: Secondary | ICD-10-CM

## 2019-02-16 NOTE — Progress Notes (Signed)
Primary Care Physician: Guadalupe Maple, MD  Primary Gastroenterologist:  Dr. Lucilla Lame  Chief Complaint  Patient presents with  . Dysphagia    HPI: Dean Larsen is a 73 y.o. male here for dysphasia.  The patient had an EGD by me in the past and did not have any sign of a narrowing but his esophagus was empirically dilated with a 54 [place] dilator.  The patient states that he has problems to both liquids and solids.  He had been doing well until recently.  The patient does report a small amount of weight loss of 2 pounds but no diarrhea constipation black stools or bloody stools.  The patient had a colonoscopy in 2013 and was told that he does not need another one for 10 years.  Current Outpatient Medications  Medication Sig Dispense Refill  . aspirin 81 MG tablet Take 81 mg by mouth daily.    Marland Kitchen buPROPion (WELLBUTRIN XL) 300 MG 24 hr tablet Take 1 tablet (300 mg total) by mouth daily. 90 tablet 4  . Choline Fenofibrate (TRILIPIX) 135 MG capsule Take 1 capsule (135 mg total) by mouth daily. 90 capsule 4  . divalproex (DEPAKOTE ER) 250 MG 24 hr tablet Take 1 tablet (250 mg total) by mouth 2 (two) times daily. 180 tablet 4  . DULoxetine (CYMBALTA) 30 MG capsule Take 1 capsule (30 mg total) by mouth daily. 90 capsule 4  . lisinopril (PRINIVIL,ZESTRIL) 20 MG tablet Take 1 tablet (20 mg total) by mouth daily. 90 tablet 4  . risperiDONE (RISPERDAL) 2 MG tablet Take 1 tablet (2 mg total) by mouth daily. 90 tablet 3   No current facility-administered medications for this visit.     Allergies as of 02/16/2019 - Review Complete 02/16/2019  Allergen Reaction Noted  . Codeine Rash 09/25/2015  . Penicillin g benzathine Rash 09/25/2015  . Percocet [oxycodone-acetaminophen] Rash 09/25/2015    ROS:  General: Negative for anorexia, weight loss, fever, chills, fatigue, weakness. ENT: Negative for hoarseness, difficulty swallowing , nasal congestion. CV: Negative for chest pain, angina,  palpitations, dyspnea on exertion, peripheral edema.  Respiratory: Negative for dyspnea at rest, dyspnea on exertion, cough, sputum, wheezing.  GI: See history of present illness. GU:  Negative for dysuria, hematuria, urinary incontinence, urinary frequency, nocturnal urination.  Endo: Negative for unusual weight change.    Physical Examination:   BP (!) 146/76   Pulse 77   Ht 5\' 4"  (1.626 m)   Wt 185 lb 3.2 oz (84 kg)   BMI 31.79 kg/m   General: Well-nourished, well-developed in no acute distress.  Eyes: No icterus. Conjunctivae pink. Mouth: Oropharyngeal mucosa moist and pink , no lesions erythema or exudate. Lungs: Clear to auscultation bilaterally. Non-labored. Heart: Regular rate and rhythm, no murmurs rubs or gallops.  Abdomen: Bowel sounds are normal, nontender, nondistended, no hepatosplenomegaly or masses, no abdominal bruits or hernia , no rebound or guarding.   Extremities: No lower extremity edema. No clubbing or deformities. Neuro: Alert and oriented x 3.  Grossly intact. Skin: Warm and dry, no jaundice.   Psych: Alert and cooperative, normal mood and affect.  Labs:    Imaging Studies: No results found.  Assessment and Plan:   Dean Larsen is a 73 y.o. y/o male who comes in with a history of dysphasia with an EGD not showing any strictures in the past.  The patient had empirical dilation and had felt better until recently.  He is now having problems  with both liquids and solids.  There is no significant weight loss.  The patient will be set up for a barium swallow to look for any strictures or narrowing as the cause of his dysphasia.  If there is any abnormalities that could be amenable to endoscopic treatment he will be set up for an EGD after that.  If it is not a stricture but a motility issue then we will discuss his options at that time.  The patient and his family have been explained the plan and agree with it.    Lucilla Lame, MD. [name]   Note: This  dictation was prepared with Dragon dictation along with smaller phrase technology. Any transcriptional errors that result from this process are unintentional.

## 2019-02-16 NOTE — Patient Instructions (Signed)
You are scheduled for a barium swallow at [facility] on Tuesday, February 25th at 8:30am. Please arrive at the [facility] registration desk at 8:15am. You cannot have anything to eat or drink 3 hours prior to scan.  If you need to reschedule this appointment for any reason, please contact central scheduling at [phone].

## 2019-02-22 ENCOUNTER — Other Ambulatory Visit: Payer: Self-pay | Admitting: Gastroenterology

## 2019-02-22 ENCOUNTER — Ambulatory Visit
Admission: RE | Admit: 2019-02-22 | Discharge: 2019-02-22 | Disposition: A | Discharge status: Home / Self Care | Payer: Medicare Other | Source: Ambulatory Visit | Attending: Gastroenterology | Admitting: Gastroenterology

## 2019-02-22 DIAGNOSIS — K222 Esophageal obstruction: Secondary | ICD-10-CM

## 2019-02-22 DIAGNOSIS — K219 Gastro-esophageal reflux disease without esophagitis: Secondary | ICD-10-CM | POA: Diagnosis not present

## 2019-02-25 ENCOUNTER — Telehealth: Payer: Self-pay

## 2019-02-25 ENCOUNTER — Other Ambulatory Visit: Payer: Self-pay

## 2019-02-25 MED ORDER — OMEPRAZOLE 40 MG PO CPDR: 40.0000 mg | DELAYED_RELEASE_CAPSULE | Freq: Every day | ORAL | 5 refills | Status: DC

## 2019-02-25 NOTE — Telephone Encounter (Signed)
-----   Message from Lucilla Lame, MD sent at 02/24/2019 11:56 AM EST ----- The patient know that the that he has significant amounts of reflux but no strictures or narrowing seen.  These make sure the patient is taking a PPI.  If he continues to have trouble swallowing we should offer him esophageal manometry.

## 2019-02-25 NOTE — Telephone Encounter (Signed)
Spoke with Anderson Malta, pt's daughter regarding results of barium swallow. Rx for Omeprazole 40mg  sent to pt's pharmacy.

## 2019-04-06 ENCOUNTER — Ambulatory Visit: Payer: Self-pay

## 2019-04-06 ENCOUNTER — Telehealth: Payer: Self-pay

## 2019-04-06 NOTE — Telephone Encounter (Signed)
Patient scheduled for an AWV on 04/13/2019 with [facility], Due to Covid-19 pandemic this is unable to be done in office, called patient to see if they are able to do this virtually or if it needed to rescheduled for an in office for after June 2020. Patient unable to access video capabilities. Rescheduled for 6/10. Mailed appt reminder per pt request

## 2019-04-13 ENCOUNTER — Ambulatory Visit: Payer: Self-pay

## 2019-05-08 ENCOUNTER — Other Ambulatory Visit: Payer: Self-pay | Admitting: Family Medicine

## 2019-05-08 DIAGNOSIS — N183 Chronic kidney disease, stage 3 unspecified: Secondary | ICD-10-CM

## 2019-05-08 DIAGNOSIS — I129 Hypertensive chronic kidney disease with stage 1 through stage 4 chronic kidney disease, or unspecified chronic kidney disease: Secondary | ICD-10-CM

## 2019-05-31 ENCOUNTER — Other Ambulatory Visit: Payer: Self-pay | Admitting: Family Medicine

## 2019-05-31 ENCOUNTER — Other Ambulatory Visit: Payer: Self-pay

## 2019-05-31 DIAGNOSIS — F3170 Bipolar disorder, currently in remission, most recent episode unspecified: Secondary | ICD-10-CM

## 2019-05-31 DIAGNOSIS — K222 Esophageal obstruction: Secondary | ICD-10-CM

## 2019-06-07 ENCOUNTER — Other Ambulatory Visit: Payer: Self-pay | Admitting: Family Medicine

## 2019-06-07 DIAGNOSIS — F3170 Bipolar disorder, currently in remission, most recent episode unspecified: Secondary | ICD-10-CM

## 2019-06-07 NOTE — Telephone Encounter (Signed)
Please advise 

## 2019-06-08 ENCOUNTER — Ambulatory Visit: Payer: Self-pay

## 2019-06-10 ENCOUNTER — Other Ambulatory Visit
Admission: RE | Admit: 2019-06-10 | Discharge: 2019-06-10 | Disposition: A | Discharge status: Home / Self Care | Payer: Medicare Other | Source: Ambulatory Visit | Attending: Gastroenterology | Admitting: Gastroenterology

## 2019-06-10 ENCOUNTER — Other Ambulatory Visit: Payer: Self-pay

## 2019-06-10 DIAGNOSIS — Z01812 Encounter for preprocedural laboratory examination: Secondary | ICD-10-CM | POA: Diagnosis not present

## 2019-06-10 DIAGNOSIS — Z1159 Encounter for screening for other viral diseases: Secondary | ICD-10-CM | POA: Diagnosis not present

## 2019-06-11 LAB — NOVEL CORONAVIRUS, NAA (HOSP ORDER, SEND-OUT TO REF LAB; TAT 18-24 HRS): SARS-CoV-2, NAA: NOT DETECTED

## 2019-06-14 ENCOUNTER — Ambulatory Visit: Payer: Medicare Other | Admitting: Certified Registered Nurse Anesthetist

## 2019-06-14 ENCOUNTER — Ambulatory Visit
Admission: RE | Admit: 2019-06-14 | Discharge: 2019-06-14 | Disposition: A | Discharge status: Home / Self Care | Payer: Medicare Other | Attending: Gastroenterology | Admitting: Gastroenterology

## 2019-06-14 ENCOUNTER — Encounter
Admission: RE | Disposition: A | Discharge status: Home / Self Care | Payer: Self-pay | Source: Home / Self Care | Attending: Gastroenterology

## 2019-06-14 DIAGNOSIS — K219 Gastro-esophageal reflux disease without esophagitis: Secondary | ICD-10-CM | POA: Diagnosis not present

## 2019-06-14 DIAGNOSIS — Z79899 Other long term (current) drug therapy: Secondary | ICD-10-CM | POA: Insufficient documentation

## 2019-06-14 DIAGNOSIS — R1310 Dysphagia, unspecified: Secondary | ICD-10-CM

## 2019-06-14 DIAGNOSIS — Z7982 Long term (current) use of aspirin: Secondary | ICD-10-CM | POA: Insufficient documentation

## 2019-06-14 DIAGNOSIS — Z87891 Personal history of nicotine dependence: Secondary | ICD-10-CM | POA: Insufficient documentation

## 2019-06-14 DIAGNOSIS — Z8673 Personal history of transient ischemic attack (TIA), and cerebral infarction without residual deficits: Secondary | ICD-10-CM | POA: Diagnosis not present

## 2019-06-14 DIAGNOSIS — F319 Bipolar disorder, unspecified: Secondary | ICD-10-CM | POA: Insufficient documentation

## 2019-06-14 DIAGNOSIS — I10 Essential (primary) hypertension: Secondary | ICD-10-CM | POA: Diagnosis not present

## 2019-06-14 DIAGNOSIS — K222 Esophageal obstruction: Secondary | ICD-10-CM

## 2019-06-14 DIAGNOSIS — E785 Hyperlipidemia, unspecified: Secondary | ICD-10-CM | POA: Diagnosis not present

## 2019-06-14 DIAGNOSIS — N183 Chronic kidney disease, stage 3 (moderate): Secondary | ICD-10-CM | POA: Diagnosis not present

## 2019-06-14 DIAGNOSIS — I129 Hypertensive chronic kidney disease with stage 1 through stage 4 chronic kidney disease, or unspecified chronic kidney disease: Secondary | ICD-10-CM | POA: Diagnosis not present

## 2019-06-14 HISTORY — PX: ESOPHAGOGASTRODUODENOSCOPY (EGD) WITH PROPOFOL: SHX5813

## 2019-06-14 SURGERY — ESOPHAGOGASTRODUODENOSCOPY (EGD) WITH PROPOFOL
Anesthesia: General

## 2019-06-14 MED ORDER — SODIUM CHLORIDE 0.9 % IV SOLN
INTRAVENOUS | Status: DC | PRN
  Administered 2019-06-14: 10:00:00 via INTRAVENOUS

## 2019-06-14 MED ORDER — PROPOFOL 500 MG/50ML IV EMUL
INTRAVENOUS | Status: DC | PRN
  Administered 2019-06-14: 125 ug/kg/min via INTRAVENOUS

## 2019-06-14 MED ORDER — PROPOFOL 10 MG/ML IV BOLUS
INTRAVENOUS | Status: DC | PRN
  Administered 2019-06-14: 40 mg via INTRAVENOUS

## 2019-06-14 MED ORDER — SODIUM CHLORIDE 0.9 % IV SOLN
INTRAVENOUS | Status: DC
  Administered 2019-06-14: 10:00:00 1000 mL via INTRAVENOUS

## 2019-06-14 MED ORDER — LIDOCAINE HCL (CARDIAC) PF 100 MG/5ML IV SOSY
PREFILLED_SYRINGE | INTRAVENOUS | Status: DC | PRN
  Administered 2019-06-14: 50 mg via INTRAVENOUS

## 2019-06-14 NOTE — Transfer of Care (Signed)
Immediate Anesthesia Transfer of Care Note  Patient: AIDENN SKELLENGER  Procedure(s) Performed: ESOPHAGOGASTRODUODENOSCOPY (EGD) WITH PROPOFOL (N/A )  Patient Location: PACU  Anesthesia Type:General  Level of Consciousness: awake, alert  and oriented  Airway & Oxygen Therapy: Patient Spontanous Breathing and Patient connected to nasal cannula oxygen  Post-op Assessment: Report given to RN and Post -op Vital signs reviewed and stable  Post vital signs: Reviewed and stable  Last Vitals:  Vitals Value Taken Time  BP    Temp    Pulse 70 06/14/19 1031  Resp 18 06/14/19 1031  SpO2 95 % 06/14/19 1031  Vitals shown include unvalidated device data.  Last Pain:  Vitals:   06/14/19 0932  TempSrc: Tympanic  PainSc: 0-No pain         Complications: No apparent anesthesia complications

## 2019-06-14 NOTE — Anesthesia Postprocedure Evaluation (Signed)
Anesthesia Post Note  Patient: Dean Larsen  Procedure(s) Performed: ESOPHAGOGASTRODUODENOSCOPY (EGD) WITH PROPOFOL (N/A )  Patient location during evaluation: PACU Anesthesia Type: General Level of consciousness: awake and alert Pain management: pain level controlled Vital Signs Assessment: post-procedure vital signs reviewed and stable Respiratory status: spontaneous breathing, nonlabored ventilation and respiratory function stable Cardiovascular status: blood pressure returned to baseline and stable Postop Assessment: no apparent nausea or vomiting Anesthetic complications: no     Last Vitals:  Vitals:   06/14/19 1040 06/14/19 1050  BP: 117/73 132/84  Pulse: 66 66  Resp: 17 14  Temp:    SpO2: 94% 97%    Last Pain:  Vitals:   06/14/19 1032  TempSrc: Tympanic  PainSc:                  Durenda Hurt

## 2019-06-14 NOTE — Op Note (Signed)
[facility] Gastroenterology Patient Name: Dean Larsen Procedure Date: 06/14/2019 10:11 AM MRN: [id number] Account #: [id number] Date of Birth: 1946-12-23 Admit Type: Outpatient Age: 73 Room: [facility] ENDO ROOM 4 Gender: Male Note Status: Finalized Procedure:            Upper GI endoscopy Indications:          Dysphagia Providers:            Lucilla Lame MD, MD Medicines:            Propofol per Anesthesia Complications:        No immediate complications. Procedure:            Pre-Anesthesia Assessment:                       - Prior to the procedure, a History and Physical was                        performed, and patient medications and allergies were                        reviewed. The patient's tolerance of previous                        anesthesia was also reviewed. The risks and benefits of                        the procedure and the sedation options and risks were                        discussed with the patient. All questions were                        answered, and informed consent was obtained. Prior                        Anticoagulants: The patient has taken no previous                        anticoagulant or antiplatelet agents. ASA Grade                        Assessment: II - A patient with mild systemic disease.                        After reviewing the risks and benefits, the patient was                        deemed in satisfactory condition to undergo the                        procedure.                       After obtaining informed consent, the endoscope was                        passed under direct vision. Throughout the procedure,                        the patient's blood pressure, pulse, and  oxygen                        saturations were monitored continuously. The Endoscope                        was introduced through the mouth, and advanced to the                        second part of duodenum. The upper GI endoscopy was               accomplished without difficulty. The patient tolerated                        the procedure well. Findings:      The examined esophagus was normal. A TTS dilator was passed through the       scope. Dilation with a 15-16.5-18 mm balloon dilator was performed to 18       mm. The dilation site was examined following endoscope reinsertion and       showed no change.      The entire examined stomach was normal.      The examined duodenum was normal.      Two biopsies were obtained with cold forceps for histology in the middle       third of the esophagus. Impression:           - Normal esophagus. Dilated.                       - Normal stomach.                       - Normal examined duodenum.                       - Biopsy performed in the middle third of the esophagus. Recommendation:       - Discharge patient to home.                       - Resume previous diet.                       - Continue present medications.                       - Await pathology results. Procedure Code(s):    --- Professional ---                       [id number], Esophagogastroduodenoscopy, flexible, transoral;                        with transendoscopic balloon dilation of esophagus                        (less than 30 mm diameter)                       43239, 59, Esophagogastroduodenoscopy, flexible,                        transoral; with biopsy, single or multiple Diagnosis Code(s):    --- Professional ---  R13.10, Dysphagia, unspecified CPT copyright 2019 American Medical Association. All rights reserved. The codes documented in this report are preliminary and upon coder review may  be revised to meet current compliance requirements. Lucilla Lame MD, MD 06/14/2019 10:28:55 AM This report has been signed electronically. Number of Addenda: 0 Note Initiated On: 06/14/2019 10:11 AM Estimated Blood Loss: Estimated blood loss: none.      [facility]

## 2019-06-14 NOTE — Anesthesia Preprocedure Evaluation (Addendum)
Anesthesia Evaluation  Patient identified by MRN, date of birth, ID band Patient awake    Reviewed: Allergy & Precautions, H&P , NPO status , Patient's Chart, lab work & pertinent test results  Airway Mallampati: II  TM Distance: >3 FB     Dental  (+) Edentulous Lower, Edentulous Upper   Pulmonary neg shortness of breath, neg recent URI, former smoker,           Cardiovascular hypertension,      Neuro/Psych  Headaches, PSYCHIATRIC DISORDERS Bipolar Disorder CVA, No Residual Symptoms    GI/Hepatic Neg liver ROS, GERD  Controlled,  Endo/Other  negative endocrine ROS  Renal/GU CRFRenal disease  negative genitourinary   Musculoskeletal  (+) Arthritis ,   Abdominal   Peds  Hematology  (+) Blood dyscrasia, anemia ,   Anesthesia Other Findings Past Medical History: No date: Arthritis No date: Bipolar disorder (HCC) No date: CVA (cerebral vascular accident) ([place])     Comment:  Age 73 - no deficits No date: Diverticulitis of colon No date: GERD (gastroesophageal reflux disease) No date: Hyperlipidemia No date: Iron deficiency anemia  Past Surgical History: 05/23/2016: ESOPHAGOGASTRODUODENOSCOPY (EGD) WITH PROPOFOL; N/A     Comment:  Procedure: ESOPHAGOGASTRODUODENOSCOPY (EGD) WITH               PROPOFOL with dialation;  Surgeon: Lucilla Lame, MD;                Location: [address];  Service: Endoscopy;                Laterality: N/A; No date: HERNIA REPAIR No date: ORTHOPEDIC SURGERY     Comment:  Surgery for broken leg and toe     Reproductive/Obstetrics negative OB ROS                            Anesthesia Physical Anesthesia Plan  ASA: III  Anesthesia Plan: General   Post-op Pain Management:    Induction:   PONV Risk Score and Plan: Propofol infusion and TIVA  Airway Management Planned:   Additional Equipment:   Intra-op Plan:   Post-operative Plan:   Informed  Consent: I have reviewed the patients History and Physical, chart, labs and discussed the procedure including the risks, benefits and alternatives for the proposed anesthesia with the patient or authorized representative who has indicated his/her understanding and acceptance.     Dental Advisory Given  Plan Discussed with: Anesthesiologist and CRNA  Anesthesia Plan Comments:         Anesthesia Quick Evaluation

## 2019-06-14 NOTE — H&P (Signed)
Lucilla Lame, MD [address] [place], [place] [address] [phone] Fax : [phone]  Primary Care Physician:  Guadalupe Maple, MD Primary Gastroenterologist:  Dr. Allen Norris  Pre-Procedure History & Physical: HPI:  Dean Larsen is a 73 y.o. male is here for an endoscopy.   Past Medical History:  Diagnosis Date  . Arthritis   . Bipolar disorder ([place])   . CVA (cerebral vascular accident) [facility])    Age 76 - no deficits  . Diverticulitis of colon   . GERD (gastroesophageal reflux disease)   . Hyperlipidemia   . Iron deficiency anemia     Past Surgical History:  Procedure Laterality Date  . ESOPHAGOGASTRODUODENOSCOPY (EGD) WITH PROPOFOL N/A 05/23/2016   Procedure: ESOPHAGOGASTRODUODENOSCOPY (EGD) WITH PROPOFOL with dialation;  Surgeon: Lucilla Lame, MD;  Location: [place];  Service: Endoscopy;  Laterality: N/A;  . HERNIA REPAIR    . ORTHOPEDIC SURGERY     Surgery for broken leg and toe    Prior to Admission medications   Medication Sig Start Date End Date Taking? Authorizing Provider  aspirin 81 MG tablet Take 81 mg by mouth daily.   Yes [provider]  buPROPion (WELLBUTRIN XL) 300 MG 24 hr tablet Take 1 tablet (300 mg total) by mouth daily. 05/05/18  Yes Crissman, Jeannette How, MD  Choline Fenofibrate (TRILIPIX) 135 MG capsule Take 1 capsule (135 mg total) by mouth daily. 05/05/18  Yes Guadalupe Maple, MD  divalproex (DEPAKOTE ER) 250 MG 24 hr tablet Take 1 tablet by mouth twice daily 06/07/19  Yes Crissman, Jeannette How, MD  DULoxetine (CYMBALTA) 30 MG capsule Take 1 capsule by mouth once daily 05/31/19  Yes Crissman, Jeannette How, MD  lisinopril (ZESTRIL) 20 MG tablet Take 1 tablet by mouth once daily 05/09/19  Yes Crissman, Jeannette How, MD  omeprazole (PRILOSEC) 40 MG capsule Take 1 capsule (40 mg total) by mouth daily. 02/25/19  Yes Lucilla Lame, MD  risperiDONE (RISPERDAL) 2 MG tablet Take 1 tablet (2 mg total) by mouth daily. 05/05/18  Yes Guadalupe Maple, MD     Allergies as of 05/31/2019 - Review Complete 02/16/2019  Allergen Reaction Noted  . Codeine Rash 09/25/2015  . Penicillin g benzathine Rash 09/25/2015  . Percocet [oxycodone-acetaminophen] Rash 09/25/2015    Family History  Problem Relation Age of Onset  . Stroke Father   . Diabetes Brother     Social History   Socioeconomic History  . Marital status: Married    Spouse name: Not on file  . Number of children: Not on file  . Years of education: Not on file  . Highest education level: Not on file  Occupational History  . Not on file  Social Needs  . Financial resource strain: Not hard at all  . Food insecurity    Worry: Never true    Inability: Never true  . Transportation needs    Medical: No    Non-medical: No  Tobacco Use  . Smoking status: Former Smoker    Quit date: 12/29/1978    Years since quitting: 40.4  . Smokeless tobacco: Current User    Types: Snuff  Substance and Sexual Activity  . Alcohol use: No    Alcohol/week: 0.0 standard drinks  . Drug use: No  . Sexual activity: Not on file  Lifestyle  . Physical activity    Days per week: 0 days    Minutes per session: 0 min  . Stress: Not at all  Relationships  . Social Herbalist on phone: Once a week    Gets together: Once a week    Attends religious service: More than 4 times per year    Active member of club or organization: No    Attends meetings of clubs or organizations: Never    Relationship status: Married  . Intimate partner violence    Fear of current or ex partner: No    Emotionally abused: No    Physically abused: No    Forced sexual activity: No  Other Topics Concern  . Not on file  Social History Narrative  . Not on file    Review of Systems: See HPI, otherwise negative ROS  Physical Exam: BP (!) 152/74   Pulse 66   Temp (!) 97.4 F (36.3 C) (Tympanic)   Resp 18   Ht 5\' 3"  (1.6 m)   Wt 82.6 kg   SpO2 100%   BMI 32.24 kg/m  General:   Alert,  pleasant and  cooperative in NAD Head:  Normocephalic and atraumatic. Neck:  Supple; no masses or thyromegaly. Lungs:  Clear throughout to auscultation.    Heart:  Regular rate and rhythm. Abdomen:  Soft, nontender and nondistended. Normal bowel sounds, without guarding, and without rebound.   Neurologic:  Alert and  oriented x4;  grossly normal neurologically.  Impression/Plan: Dean Larsen is here for an endoscopy to be performed for dysphagia  Risks, benefits, limitations, and alternatives regarding  endoscopy have been reviewed with the patient.  Questions have been answered.  All parties agreeable.   Lucilla Lame, MD  06/14/2019, 10:10 AM

## 2019-06-14 NOTE — OR Nursing (Signed)
PT;s stomach very distended. DR Veatrice Kells and gave pt instructions on things to relieve gas.

## 2019-06-14 NOTE — Anesthesia Post-op Follow-up Note (Signed)
Anesthesia QCDR form completed.        

## 2019-06-15 ENCOUNTER — Encounter: Payer: Self-pay | Admitting: Gastroenterology

## 2019-06-15 LAB — SURGICAL PATHOLOGY

## 2019-06-20 ENCOUNTER — Ambulatory Visit: Payer: Self-pay | Admitting: Family Medicine

## 2019-07-04 ENCOUNTER — Other Ambulatory Visit: Payer: Self-pay | Admitting: Family Medicine

## 2019-07-04 DIAGNOSIS — F3170 Bipolar disorder, currently in remission, most recent episode unspecified: Secondary | ICD-10-CM

## 2019-07-05 NOTE — Telephone Encounter (Signed)
Appt scheduled for 07/06/2019.

## 2019-07-05 NOTE — Telephone Encounter (Signed)
Spoke with daughter. He doesn't have the medication for tonight but patient stated he would be okay till he see's Apolonio Schneiders tomorrow.  Routing to Dr. Wynetta Emery and Apolonio Schneiders as Juluis Rainier.

## 2019-07-05 NOTE — Telephone Encounter (Signed)
Can we check to see if he has enough to make it to his appointment? Otherwise I'll get him a refill

## 2019-07-05 NOTE — Telephone Encounter (Signed)
Needs follow up appointment.  

## 2019-07-06 ENCOUNTER — Ambulatory Visit (INDEPENDENT_AMBULATORY_CARE_PROVIDER_SITE_OTHER): Payer: Medicare Other | Admitting: Family Medicine

## 2019-07-06 ENCOUNTER — Encounter: Payer: Self-pay | Admitting: Family Medicine

## 2019-07-06 ENCOUNTER — Other Ambulatory Visit: Payer: Self-pay

## 2019-07-06 VITALS — BP 138/76 | HR 67 | Temp 97.8°F | Ht 63.0 in | Wt 184.0 lb

## 2019-07-06 DIAGNOSIS — I639 Cerebral infarction, unspecified: Secondary | ICD-10-CM | POA: Diagnosis not present

## 2019-07-06 DIAGNOSIS — K222 Esophageal obstruction: Secondary | ICD-10-CM

## 2019-07-06 DIAGNOSIS — I129 Hypertensive chronic kidney disease with stage 1 through stage 4 chronic kidney disease, or unspecified chronic kidney disease: Secondary | ICD-10-CM

## 2019-07-06 DIAGNOSIS — F3170 Bipolar disorder, currently in remission, most recent episode unspecified: Secondary | ICD-10-CM | POA: Diagnosis not present

## 2019-07-06 DIAGNOSIS — I10 Essential (primary) hypertension: Secondary | ICD-10-CM

## 2019-07-06 DIAGNOSIS — E7849 Other hyperlipidemia: Secondary | ICD-10-CM

## 2019-07-06 DIAGNOSIS — N183 Chronic kidney disease, stage 3 unspecified: Secondary | ICD-10-CM

## 2019-07-06 MED ORDER — DULOXETINE HCL 30 MG PO CPEP: 30.0000 mg | ORAL_CAPSULE | Freq: Every day | ORAL | 1 refills | Status: DC

## 2019-07-06 NOTE — Progress Notes (Signed)
BP 138/76   Pulse 67   Temp 97.8 F (36.6 C) (Oral)   Ht _0  (1.6 m)   Wt 184 lb (83.5 kg)   SpO2 96%   BMI 32.59 kg/m    Subjective:    Patient ID: Dean Larsen, male    DOB: Jun 20, 1946, 73 y.o.   MRN: [id number]  HPI: Dean Larsen is a 73 y.o. male  Chief Complaint  Patient presents with  . Manic Behavior    cymbalta refill   Patient presenting today for 6 month f/u chronic conditions. No new concerns today. Taking his medicines faithfully without side effects.   HTN - Not checking home BPs often, but typically 120s-130s/70s when checked. Denies CP, SOB, HAs, SOB.   HLD - tolerating trilipix well without issue. Trying to eat healthy and stay active. Hx of CVA, on baby aspirin. No new issues.   Bipolar disorder - moods stable on cymbalta, depakote, wellbutrin, and risperal regimen. No si/hi, severe mood swings, sleep or appetite issues, hallucinations.   GERD with hx of esophageal stenosis s/p EGD last month. On PPI therapy and followed by GI for this.   Depression screen [facility] 2/9 07/06/2019 06/01/2018 04/07/2018  Decreased Interest 0 0 0  Down, Depressed, Hopeless 0 0 0  PHQ - 2 Score 0 0 0  Altered sleeping 0 - -  Tired, decreased energy 0 - -  Change in appetite 0 - -  Feeling bad or failure about yourself  0 - -  Trouble concentrating 0 - -  Moving slowly or fidgety/restless 0 - -  Suicidal thoughts 0 - -  PHQ-9 Score 0 - -  No flowsheet data found.    Relevant past medical, surgical, family and social history reviewed and updated as indicated. Interim medical history since our last visit reviewed. Allergies and medications reviewed and updated.  Review of Systems  Per HPI unless specifically indicated above     Objective:    BP 138/76   Pulse 67   Temp 97.8 F (36.6 C) (Oral)   Ht _1  (1.6 m)   Wt 184 lb (83.5 kg)   SpO2 96%   BMI 32.59 kg/m   Wt Readings from Last 3 Encounters:  07/06/19 184 lb (83.5 kg)  06/14/19 182 lb (82.6 kg)   02/16/19 185 lb 3.2 oz (84 kg)    Physical Exam Vitals signs and nursing note reviewed.  Constitutional:      Appearance: Normal appearance.  HENT:     Head: Atraumatic.  Eyes:     Extraocular Movements: Extraocular movements intact.     Conjunctiva/sclera: Conjunctivae normal.  Neck:     Musculoskeletal: Normal range of motion and neck supple.  Cardiovascular:     Rate and Rhythm: Normal rate and regular rhythm.  Pulmonary:     Effort: Pulmonary effort is normal.     Breath sounds: Normal breath sounds.  Musculoskeletal: Normal range of motion.  Skin:    General: Skin is warm and dry.  Neurological:     General: No focal deficit present.     Mental Status: He is oriented to person, place, and time.  Psychiatric:        Mood and Affect: Mood normal.        Thought Content: Thought content normal.        Judgment: Judgment normal.     Results for orders placed or performed during the hospital encounter of 06/14/19  Surgical pathology  Result  Value Ref Range   SURGICAL PATHOLOGY      Surgical Pathology CASE: [id number] PATIENT: Belinda Block Surgical Pathology Report     SPECIMEN SUBMITTED: A. Esophagus, mid, r/o EOE; cbx  CLINICAL HISTORY: None provided  PRE-OPERATIVE DIAGNOSIS: Dysphagia R 13.10  POST-OPERATIVE DIAGNOSIS: Normal EGD     DIAGNOSIS: A.  ESOPHAGUS, MID; COLD BIOPSY: - UNREMARKABLE SQUAMOUS MUCOSA. - NEGATIVE FOR EOSINOPHILS, DYSPLASIA, AND MALIGNANCY.  GROSS DESCRIPTION: A. Labeled: Mid esophagus C BXs, rule out EOE Received: Formalin Tissue fragment(s): 2 Size: 0.2-0.3 cm Description: Pale-tan soft tissue fragments Entirely submitted in 1 cassette.   Final Diagnosis performed by Quay Burow, MD.   Electronically signed 06/15/2019 11:21:48AM The electronic signature indicates that the named Attending Pathologist has evaluated the specimen  Technical component performed at [facility], [address], [place], [place] [address] Lab:  [phone] Dir: Rush Farmer, MD, MMM  Professional component performed at [facility], [place], [place], [place] [address] Lab: [phone] Dir: Dellia Nims. Rubinas, MD       Assessment & Plan:   Problem List Items Addressed This Visit      Cardiovascular and Mediastinum   Essential hypertension    Stable and under good control, continue current regimen      CVA (cerebral vascular accident) ([place])    No new issues, continue lifestyle modifications, good BP and cholesterol control, baby aspirin.         Digestive   Esophageal stenosis    Managed by GI, s/p EGD 05/2019. On PPI. Continue per GI recommendations        Genitourinary   Hypertensive kidney disease with CKD stage III ([place])    Will recheck labs in the near future, continue current regimen      Relevant Orders   Comprehensive metabolic panel     Other   Bipolar disorder ([place])    Moods stable and under good control, continue current regimen      Relevant Medications   DULoxetine (CYMBALTA) 30 MG capsule   Hyperlipidemia - Primary    Recheck lipids, continue lifestyle modifications      Relevant Orders   Comprehensive metabolic panel   Lipid Panel w/o Chol/HDL Ratio       Follow up plan: Return in about 6 months (around 01/06/2020) for CPE or 6 month f/u with MAC.

## 2019-07-10 NOTE — Assessment & Plan Note (Signed)
Moods stable and under good control, continue current regimen

## 2019-07-10 NOTE — Assessment & Plan Note (Signed)
No new issues, continue lifestyle modifications, good BP and cholesterol control, baby aspirin.

## 2019-07-10 NOTE — Assessment & Plan Note (Signed)
Stable and under good control, continue current regimen 

## 2019-07-10 NOTE — Assessment & Plan Note (Signed)
Will recheck labs in the near future, continue current regimen

## 2019-07-10 NOTE — Assessment & Plan Note (Signed)
Recheck lipids, continue lifestyle modifications 

## 2019-07-10 NOTE — Assessment & Plan Note (Signed)
Managed by GI, s/p EGD 05/2019. On PPI. Continue per GI recommendations

## 2019-09-01 ENCOUNTER — Other Ambulatory Visit: Payer: Self-pay | Admitting: Family Medicine

## 2019-09-01 DIAGNOSIS — F3170 Bipolar disorder, currently in remission, most recent episode unspecified: Secondary | ICD-10-CM

## 2019-09-01 NOTE — Telephone Encounter (Signed)
Requested medication (s) are due for refill today: yes  Requested medication (s) are on the active medication list: yes  Last refill:03/26/19  Future visit scheduled:yes   Notes to clinic:  Review for refill   Requested Prescriptions  Pending Prescriptions Disp Refills   buPROPion (WELLBUTRIN XL) 300 MG 24 hr tablet [Pharmacy Med Name: buPROPion HCl ER (XL) 300 MG Oral Tablet Extended Release 24 Hour] 90 tablet 0    Sig: Take 1 tablet by mouth once daily     Psychiatry: Antidepressants - bupropion Failed - 09/01/2019  2:56 PM      Failed - Completed PHQ-2 or PHQ-9 in the last 360 days.      Passed - Last BP in normal range    BP Readings from Last 1 Encounters:  07/06/19 138/76         Passed - Valid encounter within last 6 months    Recent Outpatient Visits          1 month ago Other hyperlipidemia   [place], [place], [place]   8 months ago Essential hypertension   [facility] Crissman, Jeannette How, MD   1 year ago Advanced care planning/counseling discussion   [facility] Crissman, Jeannette How, MD   1 year ago Bipolar affective disorder in remission [facility])   [facility] Crissman, Jeannette How, MD   2 years ago Bipolar affective disorder in remission [facility])   [place], Jeannette How, MD      Future Appointments            In 4 months Crissman, Jeannette How, MD [facility], [facility]

## 2019-09-01 NOTE — Telephone Encounter (Signed)
Patient last seen by Apolonio Schneiders 07/06/19. Next appointment is scheduled for 01/10/20.

## 2019-09-13 ENCOUNTER — Other Ambulatory Visit: Payer: Self-pay | Admitting: Family Medicine

## 2019-09-13 DIAGNOSIS — F3170 Bipolar disorder, currently in remission, most recent episode unspecified: Secondary | ICD-10-CM

## 2019-09-13 NOTE — Telephone Encounter (Signed)
Requested medication (s) are due for refill today: yes  Requested medication (s) are on the active medication list: yes  Last refill:  06/09/2019  Future visit scheduled: yes  Notes to clinic:  Refill cannot be delegated    Requested Prescriptions  Pending Prescriptions Disp Refills   divalproex (DEPAKOTE ER) 250 MG 24 hr tablet [Pharmacy Med Name: Divalproex Sodium ER 250 MG Oral Tablet Extended Release 24 Hour] 180 tablet 0    Sig: Take 1 tablet by mouth twice daily     Not Delegated - Neurology:  Anticonvulsants - Valproates Failed - 09/13/2019  2:30 AM      Failed - This refill cannot be delegated      Failed - AST in normal range and within 360 days    AST  Date Value Ref Range Status  04/20/2017 19 0 - 40 IU/L Final   AST (SGOT) Piccolo, Waived  Date Value Ref Range Status  10/16/2016 23 11 - 38 U/L Final         Failed - ALT in normal range and within 360 days    ALT  Date Value Ref Range Status  04/20/2017 23 0 - 44 IU/L Final   ALT (SGPT) Piccolo, Waived  Date Value Ref Range Status  10/16/2016 26 10 - 47 U/L Final         Failed - HGB in normal range and within 360 days    Hemoglobin  Date Value Ref Range Status  04/20/2017 12.9 (L) 13.0 - 17.7 g/dL Final         Failed - PLT in normal range and within 360 days    Platelets  Date Value Ref Range Status  04/20/2017 183 150 - 379 x10E3/uL Final         Failed - WBC in normal range and within 360 days    WBC  Date Value Ref Range Status  04/20/2017 5.4 3.4 - 10.8 x10E3/uL Final         Failed - HCT in normal range and within 360 days    Hematocrit  Date Value Ref Range Status  04/20/2017 38.4 37.5 - 51.0 % Final         Failed - Valproic Acid (serum) in normal range and within 360 days    No results found for: VALPROATE       Passed - Valid encounter within last 12 months    Recent Outpatient Visits          2 months ago Other hyperlipidemia   [facility] [place],  [place]   9 months ago Essential hypertension   [facility] Crissman, Jeannette How, MD   1 year ago Advanced care planning/counseling discussion   [facility] Crissman, Jeannette How, MD   1 year ago Bipolar affective disorder in remission [facility])   [place], Jeannette How, MD   2 years ago Bipolar affective disorder in remission [facility])   [place], Jeannette How, MD      Future Appointments            In 3 months Crissman, Jeannette How, MD [facility], [facility]

## 2019-09-28 ENCOUNTER — Other Ambulatory Visit: Payer: Self-pay | Admitting: Family Medicine

## 2019-09-28 DIAGNOSIS — E785 Hyperlipidemia, unspecified: Secondary | ICD-10-CM

## 2019-09-28 NOTE — Telephone Encounter (Signed)
Routing to provider  

## 2019-09-28 NOTE — Telephone Encounter (Signed)
Requested medication (s) are due for refill today: yes  Requested medication (s) are on the active medication list: yes    Last refill:  04/2018  Future visit scheduled: yes  Notes to clinic:  Review for refill   Requested Prescriptions  Pending Prescriptions Disp Refills   Choline Fenofibrate (FENOFIBRIC ACID) 135 MG CPDR [Pharmacy Med Name: Fenofibric Acid 135 MG Oral Capsule Delayed Release] 90 capsule 0    Sig: Take 1 capsule by mouth once daily     Cardiovascular:  Antilipid - Fibric Acid Derivatives Failed - 09/28/2019  2:22 PM      Failed - Total Cholesterol in normal range and within 360 days    Cholesterol, Total  Date Value Ref Range Status  04/20/2017 238 (H) 100 - 199 mg/dL Final   Cholesterol Piccolo, Waived  Date Value Ref Range Status  10/16/2016 CANCELED      Comment:    Test not performed  Result canceled by the ancillary          Failed - LDL in normal range and within 360 days    LDL Calculated  Date Value Ref Range Status  04/20/2017 158 (H) 0 - 99 mg/dL Final         Failed - HDL in normal range and within 360 days    HDL  Date Value Ref Range Status  04/20/2017 35 (L) >39 mg/dL Final         Failed - Triglycerides in normal range and within 360 days    Triglycerides  Date Value Ref Range Status  04/20/2017 225 (H) 0 - 149 mg/dL Final   Triglycerides Piccolo,Waived  Date Value Ref Range Status  10/16/2016 CANCELED      Comment:    Test not performed  Result canceled by the ancillary          Failed - ALT in normal range and within 180 days    ALT  Date Value Ref Range Status  04/20/2017 23 0 - 44 IU/L Final   ALT (SGPT) Piccolo, Waived  Date Value Ref Range Status  10/16/2016 26 10 - 47 U/L Final         Failed - AST in normal range and within 180 days    AST  Date Value Ref Range Status  04/20/2017 19 0 - 40 IU/L Final   AST (SGOT) Piccolo, Waived  Date Value Ref Range Status  10/16/2016 23 11 - 38 U/L Final        Failed - Cr in normal range and within 180 days    Creatinine, Ser  Date Value Ref Range Status  12/07/2018 1.37 (H) 0.76 - 1.27 mg/dL Final         Failed - eGFR in normal range and within 180 days    GFR calc Af Amer  Date Value Ref Range Status  12/07/2018 59 (L) >59 mL/min/1.73 Final   GFR calc non Af Amer  Date Value Ref Range Status  12/07/2018 51 (L) >59 mL/min/1.73 Final         Passed - Valid encounter within last 12 months    Recent Outpatient Visits          2 months ago Other hyperlipidemia   [facility] Dean Larsen, [place]   9 months ago Essential hypertension   [facility] Dean Larsen, Dean How, [place]   1 year ago Advanced care planning/counseling discussion   [facility] Dean Maple, MD   1  year ago Bipolar affective disorder in remission [facility]   [facility] Dean Larsen, Dean How, MD   2 years ago Bipolar affective disorder in remission [facility])   [place], Dean How, MD      Future Appointments            In 3 months Dean Larsen, Dean How, MD [facility], [facility]

## 2019-09-30 ENCOUNTER — Encounter: Payer: Self-pay | Admitting: Family Medicine

## 2019-11-28 ENCOUNTER — Other Ambulatory Visit: Payer: Self-pay

## 2019-11-28 DIAGNOSIS — I129 Hypertensive chronic kidney disease with stage 1 through stage 4 chronic kidney disease, or unspecified chronic kidney disease: Secondary | ICD-10-CM

## 2019-11-28 MED ORDER — LISINOPRIL 20 MG PO TABS: 20.0000 mg | ORAL_TABLET | Freq: Every day | ORAL | 0 refills | Status: DC

## 2019-11-28 NOTE — Telephone Encounter (Signed)
Patient last seen 07/06/19.

## 2019-12-22 ENCOUNTER — Other Ambulatory Visit: Payer: Self-pay

## 2019-12-22 DIAGNOSIS — F3170 Bipolar disorder, currently in remission, most recent episode unspecified: Secondary | ICD-10-CM

## 2019-12-22 MED ORDER — RISPERIDONE 2 MG PO TABS: 2.0000 mg | ORAL_TABLET | Freq: Every day | ORAL | 0 refills | Status: DC

## 2019-12-22 NOTE — Telephone Encounter (Signed)
Last appt 07/06/2019. Next appt 01/10/2020.

## 2020-01-08 ENCOUNTER — Other Ambulatory Visit: Payer: Self-pay | Admitting: Family Medicine

## 2020-01-08 DIAGNOSIS — F3170 Bipolar disorder, currently in remission, most recent episode unspecified: Secondary | ICD-10-CM

## 2020-01-09 NOTE — Telephone Encounter (Signed)
Patient last seen 07/06/19

## 2020-01-09 NOTE — Telephone Encounter (Signed)
Requested medication (s) are due for refill today: yes  Requested medication (s) are on the active medication list:  yes  Last refill:  10/03/2019  Future visit scheduled: no  Notes to clinic:  no valid encounter within last 6 months Review for refill   Requested Prescriptions  Pending Prescriptions Disp Refills   DULoxetine (CYMBALTA) 30 MG capsule [Pharmacy Med Name: DULoxetine HCl 30 MG Oral Capsule Delayed Release Particles] 90 capsule 0    Sig: Take 1 capsule by mouth once daily      Psychiatry: Antidepressants - SNRI Failed - 01/08/2020 10:06 AM      Failed - Valid encounter within last 6 months    Recent Outpatient Visits           6 months ago Other hyperlipidemia   [facility] [place], [place]   1 year ago Essential hypertension   [facility] Crissman, Jeannette How, MD   1 year ago Advanced care planning/counseling discussion   [facility] Crissman, Jeannette How, MD   1 year ago Bipolar affective disorder in remission [facility])   [facility] Crissman, Jeannette How, MD   2 years ago Bipolar affective disorder in remission [facility])   [place], MD              Passed - Last BP in normal range    BP Readings from Last 1 Encounters:  07/06/19 138/76

## 2020-01-10 ENCOUNTER — Ambulatory Visit: Payer: Medicare Other | Admitting: Family Medicine

## 2020-02-23 ENCOUNTER — Ambulatory Visit: Payer: Medicare Other

## 2020-02-23 ENCOUNTER — Ambulatory Visit (INDEPENDENT_AMBULATORY_CARE_PROVIDER_SITE_OTHER): Payer: Medicare Other | Admitting: Family Medicine

## 2020-02-23 ENCOUNTER — Other Ambulatory Visit: Payer: Self-pay

## 2020-02-23 ENCOUNTER — Encounter: Payer: Self-pay | Admitting: Family Medicine

## 2020-02-23 VITALS — BP 130/80 | HR 68 | Temp 97.8°F | Ht 63.0 in | Wt 169.0 lb

## 2020-02-23 DIAGNOSIS — E785 Hyperlipidemia, unspecified: Secondary | ICD-10-CM

## 2020-02-23 DIAGNOSIS — Z0000 Encounter for general adult medical examination without abnormal findings: Secondary | ICD-10-CM

## 2020-02-23 DIAGNOSIS — F3170 Bipolar disorder, currently in remission, most recent episode unspecified: Secondary | ICD-10-CM

## 2020-02-23 DIAGNOSIS — Z23 Encounter for immunization: Secondary | ICD-10-CM | POA: Diagnosis not present

## 2020-02-23 DIAGNOSIS — N183 Chronic kidney disease, stage 3 unspecified: Secondary | ICD-10-CM

## 2020-02-23 DIAGNOSIS — E7849 Other hyperlipidemia: Secondary | ICD-10-CM | POA: Diagnosis not present

## 2020-02-23 DIAGNOSIS — I129 Hypertensive chronic kidney disease with stage 1 through stage 4 chronic kidney disease, or unspecified chronic kidney disease: Secondary | ICD-10-CM

## 2020-02-23 DIAGNOSIS — N400 Enlarged prostate without lower urinary tract symptoms: Secondary | ICD-10-CM

## 2020-02-23 MED ORDER — DIVALPROEX SODIUM ER 250 MG PO TB24: 250.0000 mg | ORAL_TABLET | Freq: Two times a day (BID) | ORAL | 1 refills | Status: DC

## 2020-02-23 MED ORDER — DULOXETINE HCL 30 MG PO CPEP: 30.0000 mg | ORAL_CAPSULE | Freq: Every day | ORAL | 1 refills | Status: DC

## 2020-02-23 MED ORDER — BUPROPION HCL ER (XL) 300 MG PO TB24: 300.0000 mg | ORAL_TABLET | Freq: Every day | ORAL | 1 refills | Status: DC

## 2020-02-23 MED ORDER — RISPERIDONE 2 MG PO TABS: 2.0000 mg | ORAL_TABLET | Freq: Every day | ORAL | 1 refills | Status: DC

## 2020-02-23 MED ORDER — LISINOPRIL 20 MG PO TABS: 20.0000 mg | ORAL_TABLET | Freq: Every day | ORAL | 1 refills | Status: DC

## 2020-02-23 MED ORDER — FENOFIBRIC ACID 135 MG PO CPDR: 1.0000 | DELAYED_RELEASE_CAPSULE | Freq: Every day | ORAL | 1 refills | Status: DC

## 2020-02-23 NOTE — Progress Notes (Signed)
BP 130/80   Pulse 68   Temp 97.8 F (36.6 C) (Oral)   Ht 5\' 3"  (1.6 m)   Wt 169 lb (76.7 kg)   SpO2 97%   BMI 29.94 kg/m    Subjective:    Patient ID: Dean Larsen, male    DOB: 05/28/1946, 74 y.o.   MRN: [id number]  HPI: Dean Larsen is a 74 y.o. male  Chief Complaint  Patient presents with  . Hypertension  . Hyperlipidemia   Patient presenting today for 6 month f/u.   HTN - home BPs running 130/70s. Taking his medications faithfully without side effects. Denies CP, SOB, HAs, dizziness.   HLD - on fenofibrate, and trying to clean up diet and be more active. Has lost 15 lb since last year. Denies myalgias, CP, SOB.   Bipolar disorder - Moods stable and well controlled, no side effects with medications. Denies SI/HI.   BPH - Stable, no new sxs.   Hx of CVA - Stable, no new neurologic issues.   Depression screen [facility] 2/9 02/23/2020 07/06/2019 06/01/2018  Decreased Interest 0 0 0  Down, Depressed, Hopeless 0 0 0  PHQ - 2 Score 0 0 0  Altered sleeping 0 0 -  Tired, decreased energy 0 0 -  Change in appetite 0 0 -  Feeling bad or failure about yourself  0 0 -  Trouble concentrating 0 0 -  Moving slowly or fidgety/restless 0 0 -  Suicidal thoughts 0 0 -  PHQ-9 Score 0 0 -   GAD 7 : Generalized Anxiety Score 02/23/2020  Nervous, Anxious, on Edge 0  Control/stop worrying 0  Worry too much - different things 0  Trouble relaxing 0  Restless 0  Easily annoyed or irritable 0  Afraid - awful might happen 0  Total GAD 7 Score 0     Relevant past medical, surgical, family and social history reviewed and updated as indicated. Interim medical history since our last visit reviewed. Allergies and medications reviewed and updated.  Review of Systems  Per HPI unless specifically indicated above     Objective:    BP 130/80   Pulse 68   Temp 97.8 F (36.6 C) (Oral)   Ht 5\' 3"  (1.6 m)   Wt 169 lb (76.7 kg)   SpO2 97%   BMI 29.94 kg/m   Wt Readings from Last 3  Encounters:  02/23/20 169 lb (76.7 kg)  07/06/19 184 lb (83.5 kg)  06/14/19 182 lb (82.6 kg)    Physical Exam Vitals and nursing note reviewed.  Constitutional:      Appearance: Normal appearance.  HENT:     Head: Atraumatic.  Eyes:     Extraocular Movements: Extraocular movements intact.     Conjunctiva/sclera: Conjunctivae normal.  Cardiovascular:     Rate and Rhythm: Normal rate and regular rhythm.  Pulmonary:     Effort: Pulmonary effort is normal.     Breath sounds: Normal breath sounds.  Musculoskeletal:        General: Normal range of motion.     Cervical back: Normal range of motion and neck supple.  Skin:    General: Skin is warm and dry.  Neurological:     General: No focal deficit present.     Mental Status: He is oriented to person, place, and time.  Psychiatric:        Mood and Affect: Mood normal.        Thought Content: Thought content normal.  Judgment: Judgment normal.     Results for orders placed or performed during the hospital encounter of 06/14/19  Surgical pathology  Result Value Ref Range   SURGICAL PATHOLOGY      Surgical Pathology CASE: [id number] PATIENT: Dean Larsen Surgical Pathology Report     SPECIMEN SUBMITTED: A. Esophagus, mid, r/o EOE; cbx  CLINICAL HISTORY: None provided  PRE-OPERATIVE DIAGNOSIS: Dysphagia R 13.10  POST-OPERATIVE DIAGNOSIS: Normal EGD     DIAGNOSIS: A.  ESOPHAGUS, MID; COLD BIOPSY: - UNREMARKABLE SQUAMOUS MUCOSA. - NEGATIVE FOR EOSINOPHILS, DYSPLASIA, AND MALIGNANCY.  GROSS DESCRIPTION: A. Labeled: Mid esophagus C BXs, rule out EOE Received: Formalin Tissue fragment(s): 2 Size: 0.2-0.3 cm Description: Pale-tan soft tissue fragments Entirely submitted in 1 cassette.   Final Diagnosis performed by Quay Burow, MD.   Electronically signed 06/15/2019 11:21:48AM The electronic signature indicates that the named Attending Pathologist has evaluated the specimen  Technical component  performed at [facility], [address] [place], [place] [address] Lab: [phone] Dir: Rush Farmer, MD, MMM  Professional component performed at [facility], [facility], [place], [place], [place] [address] Lab: [phone] Dir: Dellia Nims. Rubinas, MD       Assessment & Plan:   Problem List Items Addressed This Visit      Genitourinary   Hypertensive kidney disease with CKD stage III    BPs stable and WNL, continue current regimen      Relevant Medications   lisinopril (ZESTRIL) 20 MG tablet   Other Relevant Orders   Comprehensive metabolic panel   CBC with Differential/Platelet   BPH (benign prostatic hyperplasia)    Stable and under good control. Recheck PSA, continue to monitor      Relevant Orders   PSA     Other   Bipolar disorder ([place])    Stable and under good control, continue current regimen      Relevant Medications   risperiDONE (RISPERDAL) 2 MG tablet   DULoxetine (CYMBALTA) 30 MG capsule   divalproex (DEPAKOTE ER) 250 MG 24 hr tablet   buPROPion (WELLBUTRIN XL) 300 MG 24 hr tablet   Hyperlipidemia    Recheck lipids, adjust as needed. Continue current regimen      Relevant Medications   lisinopril (ZESTRIL) 20 MG tablet   Choline Fenofibrate (FENOFIBRIC ACID) 135 MG CPDR   Other Relevant Orders   Lipid Panel w/o Chol/HDL Ratio    Other Visit Diagnoses    Flu vaccine need    -  Primary   Relevant Orders   Flu Vaccine QUAD High Dose(Fluad) (Completed)   Annual physical exam           Follow up plan: Return in about 6 months (around 08/22/2020) for CPE.

## 2020-02-23 NOTE — Assessment & Plan Note (Signed)
Recheck lipids, adjust as needed. Continue current regimen 

## 2020-02-23 NOTE — Assessment & Plan Note (Signed)
BPs stable and WNL, continue current regimen 

## 2020-02-23 NOTE — Assessment & Plan Note (Signed)
Stable and under good control. Recheck PSA, continue to monitor

## 2020-02-23 NOTE — Assessment & Plan Note (Signed)
Stable and under good control, continue current regimen 

## 2020-02-24 LAB — COMPREHENSIVE METABOLIC PANEL
ALT: 14 IU/L (ref 0–44)
AST: 23 IU/L (ref 0–40)
Albumin/Globulin Ratio: 1.6 (ref 1.2–2.2)
Albumin: 4.3 g/dL (ref 3.7–4.7)
Alkaline Phosphatase: 40 IU/L (ref 39–117)
BUN/Creatinine Ratio: 17 (ref 10–24)
BUN: 18 mg/dL (ref 8–27)
Bilirubin Total: 1.2 mg/dL (ref 0.0–1.2)
CO2: 27 mmol/L (ref 20–29)
Calcium: 9.6 mg/dL (ref 8.6–10.2)
Chloride: 102 mmol/L (ref 96–106)
Creatinine, Ser: 1.07 mg/dL (ref 0.76–1.27)
GFR calc Af Amer: 79 mL/min/{1.73_m2} (ref >59)
GFR calc non Af Amer: 68 mL/min/{1.73_m2} (ref >59)
Globulin, Total: 2.7 g/dL (ref 1.5–4.5)
Glucose: 90 mg/dL (ref 65–99)
Potassium: 4.6 mmol/L (ref 3.5–5.2)
Sodium: 143 mmol/L (ref 134–144)
Total Protein: 7 g/dL (ref 6.0–8.5)

## 2020-02-24 LAB — CBC WITH DIFFERENTIAL/PLATELET
Basophils Absolute: 0 10*3/uL (ref 0.0–0.2)
Basos: 1 %
EOS (ABSOLUTE): 0.1 10*3/uL (ref 0.0–0.4)
Eos: 3 %
Hematocrit: 44.6 % (ref 37.5–51.0)
Hemoglobin: 14.8 g/dL (ref 13.0–17.7)
Immature Grans (Abs): 0 10*3/uL (ref 0.0–0.1)
Immature Granulocytes: 0 %
Lymphocytes Absolute: 1.3 10*3/uL (ref 0.7–3.1)
Lymphs: 36 %
MCH: 30.6 pg (ref 26.6–33.0)
MCHC: 33.2 g/dL (ref 31.5–35.7)
MCV: 92 fL (ref 79–97)
Monocytes Absolute: 0.4 10*3/uL (ref 0.1–0.9)
Monocytes: 11 %
Neutrophils Absolute: 1.8 10*3/uL (ref 1.4–7.0)
Neutrophils: 49 %
Platelets: 155 10*3/uL (ref 150–450)
RBC: 4.84 x10E6/uL (ref 4.14–5.80)
RDW: 13.2 % (ref 11.6–15.4)
WBC: 3.7 10*3/uL (ref 3.4–10.8)

## 2020-02-24 LAB — PSA: Prostate Specific Ag, Serum: 10.7 ng/mL — ABNORMAL HIGH (ref 0.0–4.0)

## 2020-02-24 LAB — LIPID PANEL W/O CHOL/HDL RATIO
Cholesterol, Total: 241 mg/dL — ABNORMAL HIGH (ref 100–199)
HDL: 44 mg/dL (ref >39)
LDL Chol Calc (NIH): 174 mg/dL — ABNORMAL HIGH (ref 0–99)
Triglycerides: 128 mg/dL (ref 0–149)
VLDL Cholesterol Cal: 23 mg/dL (ref 5–40)

## 2020-03-15 ENCOUNTER — Ambulatory Visit: Payer: Medicare Other

## 2020-03-16 ENCOUNTER — Other Ambulatory Visit: Payer: Self-pay

## 2020-03-16 ENCOUNTER — Ambulatory Visit: Payer: Medicare Other | Attending: Internal Medicine

## 2020-03-16 DIAGNOSIS — Z23 Encounter for immunization: Secondary | ICD-10-CM

## 2020-03-16 NOTE — Progress Notes (Signed)
   Covid-19 Vaccination Clinic  Name:  Dean Larsen    MRN: [id number] DOB: Jul 03, 1946  03/16/2020  [name] was observed post Covid-19 immunization for 15 minutes without incident. He was provided with Vaccine Information Sheet and instruction to access the V-Safe system.   [name] was instructed to call 911 with any severe reactions post vaccine: Marland Kitchen Difficulty breathing  . Swelling of face and throat  . A fast heartbeat  . A bad rash all over body  . Dizziness and weakness   Immunizations Administered    Name Date Dose VIS Date Route   Pfizer COVID-19 Vaccine 03/16/2020 10:43 AM 0.3 mL 12/09/2019 Intramuscular   Manufacturer: [place]   Lot: [id number]   [place]: [id number]

## 2020-04-10 ENCOUNTER — Ambulatory Visit: Payer: Medicare Other | Attending: Internal Medicine

## 2020-04-10 DIAGNOSIS — Z23 Encounter for immunization: Secondary | ICD-10-CM

## 2020-04-10 NOTE — Progress Notes (Signed)
   Covid-19 Vaccination Clinic  Name:  Dean Larsen    MRN: [id number] DOB: 1946-01-06  04/10/2020  [name] was observed post Covid-19 immunization for 15 minutes without incident. He was provided with Vaccine Information Sheet and instruction to access the V-Safe system.   [name] was instructed to call 911 with any severe reactions post vaccine: Marland Kitchen Difficulty breathing  . Swelling of face and throat  . A fast heartbeat  . A bad rash all over body  . Dizziness and weakness   Immunizations Administered    Name Date Dose VIS Date Route   Pfizer COVID-19 Vaccine 04/10/2020 11:52 AM 0.3 mL 12/09/2019 Intramuscular   Manufacturer: [place]   Lot: [id number]   [place]: [id number]

## 2020-04-11 ENCOUNTER — Ambulatory Visit (INDEPENDENT_AMBULATORY_CARE_PROVIDER_SITE_OTHER): Payer: Medicare Other

## 2020-04-11 VITALS — BP 130/70 | Wt 169.0 lb

## 2020-04-11 DIAGNOSIS — Z0000 Encounter for general adult medical examination without abnormal findings: Secondary | ICD-10-CM

## 2020-04-11 NOTE — Progress Notes (Signed)
Subjective:   Dean Larsen is a 74 y.o. male who presents for Medicare Annual/Subsequent preventive examination.  This visit is being conducted via phone call  - after an attmept to do on video chat - due to the COVID-19 pandemic. This patient has given me verbal consent via phone to conduct this visit, patient states they are participating from their home address. Some vital signs may be absent or patient reported.   Patient identification: identified by name, DOB, and current address.    Review of Systems:   Cardiac Risk Factors include: advanced age (>74men, >69 women);male gender;hypertension;dyslipidemia     Objective:    Vitals: BP 130/70   Wt 169 lb (76.7 kg)   BMI 29.94 kg/m   Body mass index is 29.94 kg/m.  Advanced Directives 04/11/2020 06/14/2019 04/07/2018 05/23/2016  Does Patient Have a Medical Advance Directive? No No No No  Would patient like information on creating a medical advance directive? No - Patient declined - Yes (MAU/Ambulatory/Procedural Areas - Information given) -    Tobacco Social History   Tobacco Use  Smoking Status Former Smoker  . Quit date: 12/29/1978  . Years since quitting: 41.3  Smokeless Tobacco Current User  . Types: Snuff     Ready to quit: Not Answered Counseling given: Not Answered   Clinical Intake:                       Past Medical History:  Diagnosis Date  . Arthritis   . Bipolar disorder ([place])   . CVA (cerebral vascular accident) [facility])    Age 76 - no deficits  . Diverticulitis of colon   . GERD (gastroesophageal reflux disease)   . Hyperlipidemia   . Iron deficiency anemia    Past Surgical History:  Procedure Laterality Date  . ESOPHAGOGASTRODUODENOSCOPY (EGD) WITH PROPOFOL N/A 05/23/2016   Procedure: ESOPHAGOGASTRODUODENOSCOPY (EGD) WITH PROPOFOL with dialation;  Surgeon: Lucilla Lame, MD;  Location: [facility];  Service: Endoscopy;  Laterality: N/A;  . ESOPHAGOGASTRODUODENOSCOPY (EGD) WITH  PROPOFOL N/A 06/14/2019   Procedure: ESOPHAGOGASTRODUODENOSCOPY (EGD) WITH PROPOFOL;  Surgeon: Lucilla Lame, MD;  Location: [facility] ENDOSCOPY;  Service: Endoscopy;  Laterality: N/A;  . HERNIA REPAIR    . ORTHOPEDIC SURGERY     Surgery for broken leg and toe   Family History  Problem Relation Age of Onset  . Stroke Father   . Diabetes Brother    Social History   Socioeconomic History  . Marital status: Married    Spouse name: Not on file  . Number of children: Not on file  . Years of education: Not on file  . Highest education level: Not on file  Occupational History  . Occupation: retired   Tobacco Use  . Smoking status: Former Smoker    Quit date: 12/29/1978    Years since quitting: 41.3  . Smokeless tobacco: Current User    Types: Snuff  Substance and Sexual Activity  . Alcohol use: No    Alcohol/week: 0.0 standard drinks  . Drug use: No  . Sexual activity: Not on file  Other Topics Concern  . Not on file  Social History Narrative  . Not on file   Social Determinants of Health   Financial Resource Strain: Low Risk   . Difficulty of Paying Living Expenses: Not hard at all  Food Insecurity: No Food Insecurity  . Worried About Charity fundraiser in the Last Year: Never true  . Ran Out  of Food in the Last Year: Never true  Transportation Needs: No Transportation Needs  . Lack of Transportation (Medical): No  . Lack of Transportation (Non-Medical): No  Physical Activity: Inactive  . Days of Exercise per Week: 0 days  . Minutes of Exercise per Session: 0 min  Stress:   . Feeling of Stress :   Social Connections: Somewhat Isolated  . Frequency of Communication with Friends and Family: More than three times a week  . Frequency of Social Gatherings with Friends and Family: More than three times a week  . Attends Religious Services: Never  . Active Member of Clubs or Organizations: No  . Attends Archivist Meetings: Never  . Marital Status: Married     Outpatient Encounter Medications as of 04/11/2020  Medication Sig  . aspirin 81 MG tablet Take 81 mg by mouth daily.  Marland Kitchen buPROPion (WELLBUTRIN XL) 300 MG 24 hr tablet Take 1 tablet (300 mg total) by mouth daily.  . Choline Fenofibrate (FENOFIBRIC ACID) 135 MG CPDR Take 1 capsule by mouth daily.  . divalproex (DEPAKOTE ER) 250 MG 24 hr tablet Take 1 tablet (250 mg total) by mouth 2 (two) times daily.  . DULoxetine (CYMBALTA) 30 MG capsule Take 1 capsule (30 mg total) by mouth daily.  Marland Kitchen lisinopril (ZESTRIL) 20 MG tablet Take 1 tablet (20 mg total) by mouth daily.  Marland Kitchen omeprazole (PRILOSEC) 40 MG capsule Take 1 capsule (40 mg total) by mouth daily.  . risperiDONE (RISPERDAL) 2 MG tablet Take 1 tablet (2 mg total) by mouth daily.   No facility-administered encounter medications on file as of 04/11/2020.    Activities of Daily Living In your present state of health, do you have any difficulty performing the following activities: 04/11/2020  Hearing? Y  Comment no hearing aids  Vision? N  Comment eyeglasses, Dr.nice annually  Difficulty concentrating or making decisions? N  Walking or climbing stairs? N  Dressing or bathing? N  Doing errands, shopping? N  Preparing Food and eating ? N  Using the Toilet? N  In the past six months, have you accidently leaked urine? N  Do you have problems with loss of bowel control? N  Managing your Medications? N  Managing your Finances? N  Housekeeping or managing your Housekeeping? N  Some recent data might be hidden    Patient Care Team: Guadalupe Maple, MD as PCP - General (Family Medicine)   Assessment:   This is a routine wellness examination for Dean Larsen.  Exercise Activities and Dietary recommendations Current Exercise Habits: The patient does not participate in regular exercise at present, Exercise limited by: None identified  Goals Addressed   None     Fall Risk: Fall Risk  04/11/2020 02/23/2020 07/06/2019 06/01/2018 04/07/2018  Falls in  the past year? 0 0 0 No No  Number falls in past yr: 0 0 0 - -  Injury with Fall? 0 0 0 - -    FALL RISK PREVENTION PERTAINING TO THE HOME:  Any stairs in or around the home? No  If so, are there any without handrails? Yes   Home free of loose throw rugs in walkways, pet beds, electrical cords, etc? Yes  Adequate lighting in your home to reduce risk of falls? Yes   ASSISTIVE DEVICES UTILIZED TO PREVENT FALLS:  Life alert? No  Use of a cane, walker or w/c? No  Grab bars in the bathroom? Yes  Shower chair or bench in shower? no Elevated toilet  seat or a handicapped toilet? No   TIMED UP AND GO:  Unable to perform  Depression Screen PHQ 2/9 Scores 04/11/2020 02/23/2020 07/06/2019 06/01/2018  PHQ - 2 Score 0 0 0 0  PHQ- 9 Score - 0 0 -    Cognitive Function     6CIT Screen 04/07/2018  What Year? 0 points  What month? 0 points  What time? 0 points  Count back from 20 0 points  Months in reverse 0 points  Repeat phrase 10 points  Total Score 10    Immunization History  Administered Date(s) Administered  . Fluad Quad(high Dose 65+) 02/23/2020  . Influenza, High Dose Seasonal PF 10/16/2016, 12/07/2018  . Influenza,inj,Quad PF,6+ Mos 10/02/2014, 09/27/2015  . Influenza-Unspecified 01/16/2018  . PFIZER SARS-COV-2 Vaccination 03/16/2020, 04/10/2020  . Pneumococcal Conjugate-13 03/15/2015  . Pneumococcal Polysaccharide-23 04/16/2016  . Tdap 10/04/2008    Qualifies for Shingles Vaccine? Yes  Zostavax completed n/a. Due for Shingrix. Education has been provided regarding the importance of this vaccine. Pt has been advised to call insurance company to determine out of pocket expense. Advised may also receive vaccine at local pharmacy or Health Dept. Verbalized acceptance and understanding.  Tdap: Discussed need for TD/TDAP vaccine, patient verbalized understanding that this is not covered as a preventative with there insurance and to call the office he develops any new skin  injuries, ie: cuts, scrapes, bug bites, or open wounds. Flu Vaccine: up to date   Pneumococcal Vaccine: up to date  Covid-19 Vaccine: Completed vaccines  Screening Tests Health Maintenance  Topic Date Due  . TETANUS/TDAP  10/04/2018  . INFLUENZA VACCINE  07/29/2020  . COLONOSCOPY  06/09/2022  . Hepatitis C Screening  Completed  . PNA vac Low Risk Adult  Completed   Cancer Screenings:  Colorectal Screening: Completed 2013. No longer required   Lung Cancer Screening: (Low Dose CT Chest recommended if Age 64-80 years, 30 pack-year currently smoking OR have quit w/in 15years.) does not qualify.     Additional Screening:  Hepatitis C Screening: does qualify; Completed 04/16/2016  Vision Screening: Recommended annual ophthalmology exams for early detection of glaucoma and other disorders of the eye. Is the patient up to date with their annual eye exam?  Yes  Who is the provider or what is the name of the office in which the pt attends annual eye exams? Dr.Nice    Dental Screening: Recommended annual dental exams for proper oral hygiene  Community Resource Referral:  CRR required this visit?  No        Plan:  I have personally reviewed and addressed the Medicare Annual Wellness questionnaire and have noted the following in the patient's chart:  A. Medical and social history B. Use of alcohol, tobacco or illicit drugs  C. Current medications and supplements D. Functional ability and status E.  Nutritional status F.  Physical activity G. Advance directives H. List of other physicians I.  Hospitalizations, surgeries, and ER visits in previous 12 months J.  [place] such as hearing and vision if needed, cognitive and depression L. Referrals and appointments   In addition, I have reviewed and discussed with patient certain preventive protocols, quality metrics, and best practice recommendations. A written personalized care plan for preventive services as well as  general preventive health recommendations were provided to patient.   Signed,   Bevelyn Ngo, LPN  [phone] Nurse Health Advisor   Nurse Notes: none

## 2020-04-11 NOTE — Patient Instructions (Signed)
[name] , Thank you for taking time to come for your Medicare Wellness Visit. I appreciate your ongoing commitment to your health goals. Please review the following plan we discussed and let me know if I can assist you in the future.   Screening recommendations/referrals: Colonoscopy: completed 2013, no longer required  Recommended yearly ophthalmology/optometry visit for glaucoma screening and checkup Recommended yearly dental visit for hygiene and checkup  Vaccinations: Influenza vaccine: up to date  Pneumococcal vaccine: up to date  Tdap vaccine: due now  Shingles vaccine: shingrix eligible    Covid-19: completed   Advanced directives: Advance directive discussed with you today.  Once this is complete please bring a copy in to our office so we can scan it into your chart.  Conditions/risks identified: none  Next appointment: Follow up in one year for your annual wellness visit   Preventive Care 65 Years and Older, Male Preventive care refers to lifestyle choices and visits with your health care provider that can promote health and wellness. What does preventive care include?  A yearly physical exam. This is also called an annual well check.  Dental exams once or twice a year.  Routine eye exams. Ask your health care provider how often you should have your eyes checked.  Personal lifestyle choices, including:  Daily care of your teeth and gums.  Regular physical activity.  Eating a healthy diet.  Avoiding tobacco and drug use.  Limiting alcohol use.  Practicing safe sex.  Taking low doses of aspirin every day.  Taking vitamin and mineral supplements as recommended by your health care provider. What happens during an annual well check? The services and screenings done by your health care provider during your annual well check will depend on your age, overall health, lifestyle risk factors, and family history of disease. Counseling  Your health care provider may  ask you questions about your:  Alcohol use.  Tobacco use.  Drug use.  Emotional well-being.  Home and relationship well-being.  Sexual activity.  Eating habits.  History of falls.  Memory and ability to understand (cognition).  Work and work Statistician. Screening  You may have the following tests or measurements:  Height, weight, and BMI.  Blood pressure.  Lipid and cholesterol levels. These may be checked every 5 years, or more frequently if you are over 6 years old.  Skin check.  Lung cancer screening. You may have this screening every year starting at age 52 if you have a 30-pack-year history of smoking and currently smoke or have quit within the past 15 years.  Fecal occult blood test (FOBT) of the stool. You may have this test every year starting at age 34.  Flexible sigmoidoscopy or colonoscopy. You may have a sigmoidoscopy every 5 years or a colonoscopy every 10 years starting at age 53.  Prostate cancer screening. Recommendations will vary depending on your family history and other risks.  Hepatitis C blood test.  Hepatitis B blood test.  Sexually transmitted disease (STD) testing.  Diabetes screening. This is done by checking your blood sugar (glucose) after you have not eaten for a while (fasting). You may have this done every 1-3 years.  Abdominal aortic aneurysm (AAA) screening. You may need this if you are a current or former smoker.  Osteoporosis. You may be screened starting at age 59 if you are at high risk. Talk with your health care provider about your test results, treatment options, and if necessary, the need for more tests. Vaccines  Your health care provider may recommend certain vaccines, such as:  Influenza vaccine. This is recommended every year.  Tetanus, diphtheria, and acellular pertussis (Tdap, Td) vaccine. You may need a Td booster every 10 years.  Zoster vaccine. You may need this after age 17.  Pneumococcal 13-valent  conjugate (PCV13) vaccine. One dose is recommended after age 14.  Pneumococcal polysaccharide (PPSV23) vaccine. One dose is recommended after age 55. Talk to your health care provider about which screenings and vaccines you need and how often you need them. This information is not intended to replace advice given to you by your health care provider. Make sure you discuss any questions you have with your health care provider. Document Released: 01/11/2016 Document Revised: 09/03/2016 Document Reviewed: 10/16/2015 Elsevier Interactive Patient Education  2017 [place] Prevention in the Home Falls can cause injuries. They can happen to people of all ages. There are many things you can do to make your home safe and to help prevent falls. What can I do on the outside of my home?  Regularly fix the edges of walkways and driveways and fix any cracks.  Remove anything that might make you trip as you walk through a door, such as a raised step or threshold.  Trim any bushes or trees on the path to your home.  Use bright outdoor lighting.  Clear any walking paths of anything that might make someone trip, such as rocks or tools.  Regularly check to see if handrails are loose or broken. Make sure that both sides of any steps have handrails.  Any raised decks and porches should have guardrails on the edges.  Have any leaves, snow, or ice cleared regularly.  Use sand or salt on walking paths during winter.  Clean up any spills in your garage right away. This includes oil or grease spills. What can I do in the bathroom?  Use night lights.  Install grab bars by the toilet and in the tub and shower. Do not use towel bars as grab bars.  Use non-skid mats or decals in the tub or shower.  If you need to sit down in the shower, use a plastic, non-slip stool.  Keep the floor dry. Clean up any water that spills on the floor as soon as it happens.  Remove soap buildup in the tub or  shower regularly.  Attach bath mats securely with double-sided non-slip rug tape.  Do not have throw rugs and other things on the floor that can make you trip. What can I do in the bedroom?  Use night lights.  Make sure that you have a light by your bed that is easy to reach.  Do not use any sheets or blankets that are too big for your bed. They should not hang down onto the floor.  Have a firm chair that has side arms. You can use this for support while you get dressed.  Do not have throw rugs and other things on the floor that can make you trip. What can I do in the kitchen?  Clean up any spills right away.  Avoid walking on wet floors.  Keep items that you use a lot in easy-to-reach places.  If you need to reach something above you, use a strong step stool that has a grab bar.  Keep electrical cords out of the way.  Do not use floor polish or wax that makes floors slippery. If you must use wax, use non-skid floor wax.  Do  not have throw rugs and other things on the floor that can make you trip. What can I do with my stairs?  Do not leave any items on the stairs.  Make sure that there are handrails on both sides of the stairs and use them. Fix handrails that are broken or loose. Make sure that handrails are as long as the stairways.  Check any carpeting to make sure that it is firmly attached to the stairs. Fix any carpet that is loose or worn.  Avoid having throw rugs at the top or bottom of the stairs. If you do have throw rugs, attach them to the floor with carpet tape.  Make sure that you have a light switch at the top of the stairs and the bottom of the stairs. If you do not have them, ask someone to add them for you. What else can I do to help prevent falls?  Wear shoes that:  Do not have high heels.  Have rubber bottoms.  Are comfortable and fit you well.  Are closed at the toe. Do not wear sandals.  If you use a stepladder:  Make sure that it is fully  opened. Do not climb a closed stepladder.  Make sure that both sides of the stepladder are locked into place.  Ask someone to hold it for you, if possible.  Clearly mark and make sure that you can see:  Any grab bars or handrails.  First and last steps.  Where the edge of each step is.  Use tools that help you move around (mobility aids) if they are needed. These include:  Canes.  Walkers.  Scooters.  Crutches.  Turn on the lights when you go into a dark area. Replace any light bulbs as soon as they burn out.  Set up your furniture so you have a clear path. Avoid moving your furniture around.  If any of your floors are uneven, fix them.  If there are any pets around you, be aware of where they are.  Review your medicines with your doctor. Some medicines can make you feel dizzy. This can increase your chance of falling. Ask your doctor what other things that you can do to help prevent falls. This information is not intended to replace advice given to you by your health care provider. Make sure you discuss any questions you have with your health care provider. Document Released: 10/11/2009 Document Revised: 05/22/2016 Document Reviewed: 01/19/2015 Elsevier Interactive Patient Education  2017 [employer].

## 2020-08-15 ENCOUNTER — Ambulatory Visit (INDEPENDENT_AMBULATORY_CARE_PROVIDER_SITE_OTHER): Payer: Medicare Other | Admitting: Family Medicine

## 2020-08-15 ENCOUNTER — Other Ambulatory Visit: Payer: Self-pay

## 2020-08-15 ENCOUNTER — Encounter: Payer: Self-pay | Admitting: Family Medicine

## 2020-08-15 VITALS — BP 137/67 | HR 84 | Temp 98.8°F | Wt 174.2 lb

## 2020-08-15 DIAGNOSIS — Z23 Encounter for immunization: Secondary | ICD-10-CM

## 2020-08-15 DIAGNOSIS — T300 Burn of unspecified body region, unspecified degree: Secondary | ICD-10-CM

## 2020-08-15 MED ORDER — SILVER SULFADIAZINE 1 % EX CREA: 1.0000 "application " | TOPICAL_CREAM | Freq: Every day | CUTANEOUS | 0 refills | Status: DC

## 2020-08-19 NOTE — Progress Notes (Signed)
BP 137/67   Pulse 84   Temp 98.8 F (37.1 C) (Oral)   Wt 174 lb 3.2 oz (79 kg)   SpO2 96%   BMI 30.86 kg/m    Subjective:    Patient ID: Dean Larsen, male    DOB: 11-Aug-1946, 74 y.o.   MRN: [id number]  HPI: Dean Larsen is a 74 y.o. male  Chief Complaint  Patient presents with  . Burn    pt states he burned his right hand at the end of last week on a vehicle. States it is getting better but was oozing a few days ago    Here today with a burn on his right hand from working on an engine about 5 days ago. Has been keeping the area clean, applying aloe and bandages. Daughter who is with him states over the weekend it was quite swollen and draining but now it seems to be looking better. Pain is moderately controlled, no fevers, chills, body aches.   Relevant past medical, surgical, family and social history reviewed and updated as indicated. Interim medical history since our last visit reviewed. Allergies and medications reviewed and updated.  Review of Systems  Per HPI unless specifically indicated above     Objective:    BP 137/67   Pulse 84   Temp 98.8 F (37.1 C) (Oral)   Wt 174 lb 3.2 oz (79 kg)   SpO2 96%   BMI 30.86 kg/m   Wt Readings from Last 3 Encounters:  08/15/20 174 lb 3.2 oz (79 kg)  04/11/20 169 lb (76.7 kg)  02/23/20 169 lb (76.7 kg)    Physical Exam Vitals and nursing note reviewed.  Constitutional:      Appearance: Normal appearance.  HENT:     Head: Atraumatic.  Eyes:     Extraocular Movements: Extraocular movements intact.     Conjunctiva/sclera: Conjunctivae normal.  Cardiovascular:     Rate and Rhythm: Normal rate and regular rhythm.     Pulses: Normal pulses.  Pulmonary:     Effort: Pulmonary effort is normal.     Breath sounds: Normal breath sounds.  Musculoskeletal:        General: Normal range of motion.     Cervical back: Normal range of motion and neck supple.  Skin:    General: Skin is warm.     Comments: 2.5 cm oblong  scabbed burn on right dorsal hand. Wound edges mildly erythematous, no drainage, minimal erythema  Neurological:     General: No focal deficit present.     Mental Status: He is oriented to person, place, and time.  Psychiatric:        Mood and Affect: Mood normal.        Thought Content: Thought content normal.        Judgment: Judgment normal.     Results for orders placed or performed in visit on 02/23/20  Comprehensive metabolic panel  Result Value Ref Range   Glucose 90 65 - 99 mg/dL   BUN 18 8 - 27 mg/dL   Creatinine, Ser 1.07 0.76 - 1.27 mg/dL   GFR calc non Af Amer 68 >59 mL/min/1.73   GFR calc Af Amer 79 >59 mL/min/1.73   BUN/Creatinine Ratio 17 10 - 24   Sodium 143 134 - 144 mmol/L   Potassium 4.6 3.5 - 5.2 mmol/L   Chloride 102 96 - 106 mmol/L   CO2 27 20 - 29 mmol/L   Calcium 9.6 8.6 - 10.2  mg/dL   Total Protein 7.0 6.0 - 8.5 g/dL   Albumin 4.3 3.7 - 4.7 g/dL   Globulin, Total 2.7 1.5 - 4.5 g/dL   Albumin/Globulin Ratio 1.6 1.2 - 2.2   Bilirubin Total 1.2 0.0 - 1.2 mg/dL   Alkaline Phosphatase 40 39 - 117 IU/L   AST 23 0 - 40 IU/L   ALT 14 0 - 44 IU/L  Lipid Panel w/o Chol/HDL Ratio  Result Value Ref Range   Cholesterol, Total 241 (H) 100 - 199 mg/dL   Triglycerides 128 0 - 149 mg/dL   HDL 44 >39 mg/dL   VLDL Cholesterol Cal 23 5 - 40 mg/dL   LDL Chol Calc (NIH) 174 (H) 0 - 99 mg/dL  CBC with Differential/Platelet  Result Value Ref Range   WBC 3.7 3.4 - 10.8 x10E3/uL   RBC 4.84 4.14 - 5.80 x10E6/uL   Hemoglobin 14.8 13.0 - 17.7 g/dL   Hematocrit 44.6 37.5 - 51.0 %   MCV 92 79 - 97 fL   MCH 30.6 26.6 - 33.0 pg   MCHC 33.2 31 - 35 g/dL   RDW 13.2 11.6 - 15.4 %   Platelets 155 150 - 450 x10E3/uL   Neutrophils 49 Not Estab. %   Lymphs 36 Not Estab. %   Monocytes 11 Not Estab. %   Eos 3 Not Estab. %   Basos 1 Not Estab. %   Neutrophils Absolute 1.8 1 - 7 x10E3/uL   Lymphocytes Absolute 1.3 0 - 3 x10E3/uL   Monocytes Absolute 0.4 0 - 0 x10E3/uL   EOS  (ABSOLUTE) 0.1 0.0 - 0.4 x10E3/uL   Basophils Absolute 0.0 0 - 0 x10E3/uL   Immature Granulocytes 0 Not Estab. %   Immature Grans (Abs) 0.0 0.0 - 0.1 x10E3/uL  PSA  Result Value Ref Range   Prostate Specific Ag, Serum 10.7 (H) 0.0 - 4.0 ng/mL      Assessment & Plan:   Problem List Items Addressed This Visit    None    Visit Diagnoses    Burn    -  Primary   No evidence of infection, silvidene cream sent, keep covered with non stick gauze. Td given as he was due. F/u if worsening and not resolving   Relevant Orders   Td vaccine greater than or equal to 7yo preservative free IM (Completed)       Follow up plan: Return if symptoms worsen or fail to improve.

## 2020-08-22 ENCOUNTER — Other Ambulatory Visit: Payer: Self-pay

## 2020-08-22 ENCOUNTER — Ambulatory Visit (INDEPENDENT_AMBULATORY_CARE_PROVIDER_SITE_OTHER): Payer: Medicare Other | Admitting: Family Medicine

## 2020-08-22 ENCOUNTER — Encounter: Payer: Self-pay | Admitting: Family Medicine

## 2020-08-22 VITALS — BP 160/77 | HR 75 | Temp 97.9°F | Wt 172.0 lb

## 2020-08-22 DIAGNOSIS — I129 Hypertensive chronic kidney disease with stage 1 through stage 4 chronic kidney disease, or unspecified chronic kidney disease: Secondary | ICD-10-CM | POA: Diagnosis not present

## 2020-08-22 DIAGNOSIS — Z8673 Personal history of transient ischemic attack (TIA), and cerebral infarction without residual deficits: Secondary | ICD-10-CM

## 2020-08-22 DIAGNOSIS — E7849 Other hyperlipidemia: Secondary | ICD-10-CM | POA: Diagnosis not present

## 2020-08-22 DIAGNOSIS — N400 Enlarged prostate without lower urinary tract symptoms: Secondary | ICD-10-CM | POA: Diagnosis not present

## 2020-08-22 DIAGNOSIS — F3170 Bipolar disorder, currently in remission, most recent episode unspecified: Secondary | ICD-10-CM

## 2020-08-22 DIAGNOSIS — R079 Chest pain, unspecified: Secondary | ICD-10-CM | POA: Diagnosis not present

## 2020-08-22 DIAGNOSIS — N183 Chronic kidney disease, stage 3 unspecified: Secondary | ICD-10-CM | POA: Diagnosis not present

## 2020-08-22 DIAGNOSIS — R9431 Abnormal electrocardiogram [ECG] [EKG]: Secondary | ICD-10-CM

## 2020-08-22 MED ORDER — LOSARTAN POTASSIUM 50 MG PO TABS: 50.0000 mg | ORAL_TABLET | Freq: Every day | ORAL | 0 refills | Status: DC

## 2020-08-22 NOTE — Assessment & Plan Note (Signed)
No new neurologic deficits, continue to monitor and continue good BP and chol control, lifestyle modifications, aspirin

## 2020-08-22 NOTE — Assessment & Plan Note (Signed)
BPs mildly elevated today but WNL at home. Will d/c lisinopril and start losartan to see if this may be causing his chronic cough. Recheck 4-6 weeks and be monitoring closely at home.

## 2020-08-22 NOTE — Progress Notes (Signed)
BP (!) 160/77   Pulse 75   Temp 97.9 F (36.6 C) (Oral)   Wt 172 lb (78 kg)   SpO2 93%   BMI 30.47 kg/m    Subjective:    Patient ID: Dean Larsen, male    DOB: January 22, 1946, 74 y.o.   MRN: [id number]  HPI: Dean Larsen is a 74 y.o. male  Chief Complaint  Patient presents with  . Hypertension  . Hyperlipidemia   Here today for 6 month f/u chronic conditions.   HTN - currently on lisinopril. Home HTN 130s/70s at home.  Taking faithfully, trying to eat well and stay active. Has been dealing with a cough that he thinks is related to his lisinopril. It comes and goes, typically dry.   HLD - on fenofibrate currently, tolerating well. Denies claudication, myalgias.   Having left sided chest pains intermittently for about a month now. Feels like an ache with intermittent sharp pain. Does not notice it more when active, mostly when sitting. Some SOB when this is happening. Has not sought care for this up until now, was hoping it was reflux related though takes his prilosec faithfully.   BPH - no current urinary issues/sxs. Hx of elevated PSAs the past few years, has opted for monitoring up until now. Not taking anything for this.   Bipolar depression - moods stable on depakote, cymbalta and wellbutrin regimen. Denies side effects, SI/HI.   Depression screen [facility] 2/9 04/11/2020 02/23/2020 07/06/2019  Decreased Interest 0 0 0  Down, Depressed, Hopeless 0 0 0  PHQ - 2 Score 0 0 0  Altered sleeping - 0 0  Tired, decreased energy - 0 0  Change in appetite - 0 0  Feeling bad or failure about yourself  - 0 0  Trouble concentrating - 0 0  Moving slowly or fidgety/restless - 0 0  Suicidal thoughts - 0 0  PHQ-9 Score - 0 0   GAD 7 : Generalized Anxiety Score 02/23/2020  Nervous, Anxious, on Edge 0  Control/stop worrying 0  Worry too much - different things 0  Trouble relaxing 0  Restless 0  Easily annoyed or irritable 0  Afraid - awful might happen 0  Total GAD 7 Score 0    Relevant past medical, surgical, family and social history reviewed and updated as indicated. Interim medical history since our last visit reviewed. Allergies and medications reviewed and updated.  Review of Systems  Per HPI unless specifically indicated above     Objective:    BP (!) 160/77   Pulse 75   Temp 97.9 F (36.6 C) (Oral)   Wt 172 lb (78 kg)   SpO2 93%   BMI 30.47 kg/m   Wt Readings from Last 3 Encounters:  08/22/20 172 lb (78 kg)  08/15/20 174 lb 3.2 oz (79 kg)  04/11/20 169 lb (76.7 kg)    Physical Exam Vitals and nursing note reviewed.  Constitutional:      Appearance: Normal appearance.  HENT:     Head: Atraumatic.  Eyes:     Extraocular Movements: Extraocular movements intact.     Conjunctiva/sclera: Conjunctivae normal.  Cardiovascular:     Rate and Rhythm: Normal rate and regular rhythm.  Pulmonary:     Effort: Pulmonary effort is normal.     Breath sounds: Normal breath sounds.  Abdominal:     General: Bowel sounds are normal. There is no distension.     Palpations: Abdomen is soft.  Tenderness: There is no abdominal tenderness. There is no right CVA tenderness, left CVA tenderness or guarding.  Genitourinary:    Comments: GU exam declined Musculoskeletal:        General: Normal range of motion.     Cervical back: Normal range of motion and neck supple.  Skin:    General: Skin is warm and dry.  Neurological:     General: No focal deficit present.     Mental Status: He is oriented to person, place, and time.  Psychiatric:        Mood and Affect: Mood normal.        Thought Content: Thought content normal.        Judgment: Judgment normal.    Results for orders placed or performed in visit on 02/23/20  Comprehensive metabolic panel  Result Value Ref Range   Glucose 90 65 - 99 mg/dL   BUN 18 8 - 27 mg/dL   Creatinine, Ser 1.07 0.76 - 1.27 mg/dL   GFR calc non Af Amer 68 >59 mL/min/1.73   GFR calc Af Amer 79 >59 mL/min/1.73    BUN/Creatinine Ratio 17 10 - 24   Sodium 143 134 - 144 mmol/L   Potassium 4.6 3.5 - 5.2 mmol/L   Chloride 102 96 - 106 mmol/L   CO2 27 20 - 29 mmol/L   Calcium 9.6 8.6 - 10.2 mg/dL   Total Protein 7.0 6.0 - 8.5 g/dL   Albumin 4.3 3.7 - 4.7 g/dL   Globulin, Total 2.7 1.5 - 4.5 g/dL   Albumin/Globulin Ratio 1.6 1.2 - 2.2   Bilirubin Total 1.2 0.0 - 1.2 mg/dL   Alkaline Phosphatase 40 39 - 117 IU/L   AST 23 0 - 40 IU/L   ALT 14 0 - 44 IU/L  Lipid Panel w/o Chol/HDL Ratio  Result Value Ref Range   Cholesterol, Total 241 (H) 100 - 199 mg/dL   Triglycerides 128 0 - 149 mg/dL   HDL 44 >39 mg/dL   VLDL Cholesterol Cal 23 5 - 40 mg/dL   LDL Chol Calc (NIH) 174 (H) 0 - 99 mg/dL  CBC with Differential/Platelet  Result Value Ref Range   WBC 3.7 3.4 - 10.8 x10E3/uL   RBC 4.84 4.14 - 5.80 x10E6/uL   Hemoglobin 14.8 13.0 - 17.7 g/dL   Hematocrit 44.6 37.5 - 51.0 %   MCV 92 79 - 97 fL   MCH 30.6 26.6 - 33.0 pg   MCHC 33.2 31 - 35 g/dL   RDW 13.2 11.6 - 15.4 %   Platelets 155 150 - 450 x10E3/uL   Neutrophils 49 Not Estab. %   Lymphs 36 Not Estab. %   Monocytes 11 Not Estab. %   Eos 3 Not Estab. %   Basos 1 Not Estab. %   Neutrophils Absolute 1.8 1 - 7 x10E3/uL   Lymphocytes Absolute 1.3 0 - 3 x10E3/uL   Monocytes Absolute 0.4 0 - 0 x10E3/uL   EOS (ABSOLUTE) 0.1 0.0 - 0.4 x10E3/uL   Basophils Absolute 0.0 0 - 0 x10E3/uL   Immature Granulocytes 0 Not Estab. %   Immature Grans (Abs) 0.0 0.0 - 0.1 x10E3/uL  PSA  Result Value Ref Range   Prostate Specific Ag, Serum 10.7 (H) 0.0 - 4.0 ng/mL      Assessment & Plan:   Problem List Items Addressed This Visit      Genitourinary   Hypertensive kidney disease with CKD stage III - Primary    BPs mildly  elevated today but WNL at home. Will d/c lisinopril and start losartan to see if this may be causing his chronic cough. Recheck 4-6 weeks and be monitoring closely at home.       Relevant Orders   Comprehensive metabolic panel   BPH  (benign prostatic hyperplasia)    Will recheck PSA, but discussed importance of Urology f/u as it's been elevated consistently. Agreeable to referral, referral generated      Relevant Orders   PSA   Ambulatory referral to Urology     Other   History of CVA (cerebrovascular accident)    No new neurologic deficits, continue to monitor and continue good BP and chol control, lifestyle modifications, aspirin      Bipolar disorder ([place])    Moods stable and well controlled, continue present regimen      Hyperlipidemia    Recheck lipids, adjust as needed. Continue diet and exercise changes, may need to increase regimen based on labs      Relevant Medications   losartan (COZAAR) 50 MG tablet   Other Relevant Orders   Lipid Panel w/o Chol/HDL Ratio    Other Visit Diagnoses    Chest pain, unspecified type       Given risk factors and quality of sxs, concern for CAD and possible ischemia. EKG showing nonspecific T wave changes. Urgent referral to Cardiology placed   Relevant Orders   EKG 12-Lead (Completed)   Ambulatory referral to Cardiology   Abnormal EKG       Urgent Cardiology referral placed, ER precautions given if continuing or worsening in meantime. Pt agreeable   Relevant Orders   Ambulatory referral to Cardiology      50 minutes spent today in direct care, counseling, and coordination of care for patient.   Follow up plan: Return for 4-6 weeks for BP f/u.

## 2020-08-22 NOTE — Assessment & Plan Note (Signed)
Will recheck PSA, but discussed importance of Urology f/u as it's been elevated consistently. Agreeable to referral, referral generated

## 2020-08-22 NOTE — Assessment & Plan Note (Signed)
Moods stable and well controlled, continue present regimen

## 2020-08-22 NOTE — Assessment & Plan Note (Signed)
Recheck lipids, adjust as needed. Continue diet and exercise changes, may need to increase regimen based on labs

## 2020-08-23 ENCOUNTER — Other Ambulatory Visit: Payer: Self-pay | Admitting: Family Medicine

## 2020-08-23 LAB — LIPID PANEL W/O CHOL/HDL RATIO
Cholesterol, Total: 241 mg/dL — ABNORMAL HIGH (ref 100–199)
HDL: 51 mg/dL (ref >39)
LDL Chol Calc (NIH): 161 mg/dL — ABNORMAL HIGH (ref 0–99)
Triglycerides: 158 mg/dL — ABNORMAL HIGH (ref 0–149)
VLDL Cholesterol Cal: 29 mg/dL (ref 5–40)

## 2020-08-23 LAB — COMPREHENSIVE METABOLIC PANEL
ALT: 16 IU/L (ref 0–44)
AST: 24 IU/L (ref 0–40)
Albumin/Globulin Ratio: 1.9 (ref 1.2–2.2)
Albumin: 4.5 g/dL (ref 3.7–4.7)
Alkaline Phosphatase: 33 IU/L — ABNORMAL LOW (ref 48–121)
BUN/Creatinine Ratio: 13 (ref 10–24)
BUN: 16 mg/dL (ref 8–27)
Bilirubin Total: 0.4 mg/dL (ref 0.0–1.2)
CO2: 27 mmol/L (ref 20–29)
Calcium: 9.7 mg/dL (ref 8.6–10.2)
Chloride: 102 mmol/L (ref 96–106)
Creatinine, Ser: 1.24 mg/dL (ref 0.76–1.27)
GFR calc Af Amer: 65 mL/min/{1.73_m2} (ref >59)
GFR calc non Af Amer: 57 mL/min/{1.73_m2} — ABNORMAL LOW (ref >59)
Globulin, Total: 2.4 g/dL (ref 1.5–4.5)
Glucose: 100 mg/dL — ABNORMAL HIGH (ref 65–99)
Potassium: 4.7 mmol/L (ref 3.5–5.2)
Sodium: 142 mmol/L (ref 134–144)
Total Protein: 6.9 g/dL (ref 6.0–8.5)

## 2020-08-23 LAB — PSA: Prostate Specific Ag, Serum: 14.4 ng/mL — ABNORMAL HIGH (ref 0.0–4.0)

## 2020-08-23 MED ORDER — ATORVASTATIN CALCIUM 20 MG PO TABS: 20.0000 mg | ORAL_TABLET | Freq: Every day | ORAL | 0 refills | Status: DC

## 2020-08-28 ENCOUNTER — Encounter: Payer: Self-pay | Admitting: Cardiovascular Disease

## 2020-08-28 ENCOUNTER — Other Ambulatory Visit: Payer: Self-pay

## 2020-08-28 ENCOUNTER — Ambulatory Visit: Payer: Medicare Other | Admitting: Cardiovascular Disease

## 2020-08-28 VITALS — BP 120/56 | HR 75 | Ht 64.0 in | Wt 170.0 lb

## 2020-08-28 DIAGNOSIS — R079 Chest pain, unspecified: Secondary | ICD-10-CM | POA: Diagnosis not present

## 2020-08-28 DIAGNOSIS — R0602 Shortness of breath: Secondary | ICD-10-CM

## 2020-08-28 NOTE — Patient Instructions (Signed)
Medication Instructions:  Your physician recommends that you continue on your current medications as directed. Please refer to the Current Medication list given to you today.  *If you need a refill on your cardiac medications before your next appointment, please call your pharmacy*   Lab Work: None ordered If you have labs (blood work) drawn today and your tests are completely normal, you will receive your results only by:  Dean Larsen (if you have MyChart) OR  A paper copy in the mail If you have any lab test that is abnormal or we need to change your treatment, we will call you to review the results.   Testing/Procedures: Your physician has requested that you have an echocardiogram. Echocardiography is a painless test that uses sound waves to create images of your heart. It provides your doctor with information about the size and shape of your heart and how well your hearts chambers and valves are working. This procedure takes approximately one hour. There are no restrictions for this procedure.  Your physician has requested that you have a lexiscan myoview. For further information please visit [email or web address]. Please follow instruction sheet, as given.     Follow-Up: At [facility], you and your health needs are our priority.  As part of our continuing mission to provide you with exceptional heart care, we have created designated Provider Care Teams.  These Care Teams include your primary Cardiologist (physician) and Advanced Practice Providers (APPs -  Physician Assistants and Nurse Practitioners) who all work together to provide you with the care you need, when you need it.  We recommend signing up for the patient portal called "MyChart".  Sign up information is provided on this After Visit Summary.  MyChart is used to connect with patients for Virtual Visits (Telemedicine).  Patients are able to view lab/test results, encounter notes, upcoming appointments, etc.   Non-urgent messages can be sent to your provider as well.   To learn more about what you can do with MyChart, go to [email or web address].    Your next appointment:   As needed   The format for your next appointment:   In Person  Provider:    You may see  Dr. Fletcher Anon or one of the following Advanced Practice Providers on your designated Care Team:    Murray Hodgkins, NP  Christell Faith, PA-C  Marrianne Mood, PA-C    Other Instructions [place]  Your caregiver has ordered a Stress Test with nuclear imaging. The purpose of this test is to evaluate the blood supply to your heart muscle. This procedure is referred to as a "Non-Invasive Stress Test." This is because other than having an IV started in your vein, nothing is inserted or "invades" your body. Cardiac stress tests are done to find areas of poor blood flow to the heart by determining the extent of coronary artery disease (CAD). Some patients exercise on a treadmill, which naturally increases the blood flow to your heart, while others who are  unable to walk on a treadmill due to physical limitations have a pharmacologic/chemical stress agent called Lexiscan . This medicine will mimic walking on a treadmill by temporarily increasing your coronary blood flow.   Please note: these test may take anywhere between 2-4 hours to complete  PLEASE REPORT TO [place] AT THE FIRST DESK WILL DIRECT YOU WHERE TO GO  Date of Procedure:_____________________________________  Arrival Time for Procedure:______________________________   PLEASE NOTIFY THE OFFICE AT LEAST 24 HOURS IN ADVANCE  IF YOU ARE UNABLE TO KEEP YOUR APPOINTMENT.  [phone] AND  PLEASE NOTIFY NUCLEAR MEDICINE AT [facility] AT LEAST 24 HOURS IN ADVANCE IF YOU ARE UNABLE TO KEEP YOUR APPOINTMENT. [phone]  How to prepare for your Myoview test:  1. Do not eat or drink after midnight 2. No caffeine for 24 hours prior to test 3. No  smoking 24 hours prior to test. 4. Your medication may be taken with water.  If your doctor stopped a medication because of this test, do not take that medication. 5. Ladies, please do not wear dresses.  Skirts or pants are appropriate. Please wear a short sleeve shirt. 6. No perfume, cologne or lotion. 7. Wear comfortable walking shoes. No heels!

## 2020-08-28 NOTE — Progress Notes (Signed)
Cardiology Office Note   Date:  08/28/2020   ID:  Dean, Larsen Jun 21, 1946, MRN [id number]  PCP:  Dean Maple, MD  Cardiologist:   Dean Sacramento, MD   Chief Complaint  Patient presents with  . New Patient (Initial Visit)    Referred by PCP for Abnormal EKG and chest pain. meds reviewd verbally with patient.       History of Present Illness: Dean Larsen is a 74 y.o. male who was referred by Dean Larsen for evaluation of chest pain.  The patient has no prior cardiac history.  He reports having a stroke in his 21s and has multiple other comorbidities including hyperlipidemia, essential hypertension, chronic kidney disease and bipolar disorder.  He reports family history of stroke and cancer.  He quit smoking in the 80s.  He was recently found to have elevation in PSA and is supposed to see urology in the near future.  Over the last month, he has experienced intermittent episodes of left-sided sharp chest discomfort lasting few minutes mostly happening at rest.  In addition, he reports significant worsening of exertional dyspnea without orthopnea, PND or leg edema.  He had no recent cardiac evaluation.  He does not exercise on a regular basis.  He takes his medications regularly.    Past Medical History:  Diagnosis Date  . Arthritis   . Bipolar disorder ([place])   . CVA (cerebral vascular accident) [facility])    Age 21 - no deficits  . Diverticulitis of colon   . GERD (gastroesophageal reflux disease)   . Hyperlipidemia   . Iron deficiency anemia     Past Surgical History:  Procedure Laterality Date  . ESOPHAGOGASTRODUODENOSCOPY (EGD) WITH PROPOFOL N/A 05/23/2016   Procedure: ESOPHAGOGASTRODUODENOSCOPY (EGD) WITH PROPOFOL with dialation;  Surgeon: Dean Lame, MD;  Location: [place];  Service: Endoscopy;  Laterality: N/A;  . ESOPHAGOGASTRODUODENOSCOPY (EGD) WITH PROPOFOL N/A 06/14/2019   Procedure: ESOPHAGOGASTRODUODENOSCOPY (EGD) WITH PROPOFOL;  Surgeon:  Dean Lame, MD;  Location: [facility] ENDOSCOPY;  Service: Endoscopy;  Laterality: N/A;  . HERNIA REPAIR    . ORTHOPEDIC SURGERY     Surgery for broken leg and toe     Current Outpatient Medications  Medication Sig Dispense Refill  . aspirin 81 MG tablet Take 81 mg by mouth daily.    Marland Kitchen atorvastatin (LIPITOR) 20 MG tablet Take 1 tablet (20 mg total) by mouth daily. 90 tablet 0  . buPROPion (WELLBUTRIN XL) 300 MG 24 hr tablet Take 1 tablet (300 mg total) by mouth daily. 90 tablet 1  . Choline Fenofibrate (FENOFIBRIC ACID) 135 MG CPDR Take 1 capsule by mouth daily. 90 capsule 1  . divalproex (DEPAKOTE ER) 250 MG 24 hr tablet Take 1 tablet (250 mg total) by mouth 2 (two) times daily. 180 tablet 1  . DULoxetine (CYMBALTA) 30 MG capsule Take 1 capsule (30 mg total) by mouth daily. 90 capsule 1  . losartan (COZAAR) 50 MG tablet Take 1 tablet (50 mg total) by mouth daily. 90 tablet 0  . omeprazole (PRILOSEC) 40 MG capsule Take 1 capsule (40 mg total) by mouth daily. 30 capsule 5  . risperiDONE (RISPERDAL) 2 MG tablet Take 1 tablet (2 mg total) by mouth daily. 90 tablet 1  . silver sulfADIAZINE (SILVADENE) 1 % cream Apply 1 application topically daily. 50 g 0   No current facility-administered medications for this visit.    Allergies:   Codeine, Penicillin g benzathine, and Percocet [oxycodone-acetaminophen]  Social History:  The patient  reports that he quit smoking about 41 years ago. His smokeless tobacco use includes snuff. He reports that he does not drink alcohol and does not use drugs.   Family History:  The patient's family history includes Diabetes in his brother; Stroke in his father.    ROS:  Please see the history of present illness.   Otherwise, review of systems are positive for none.   All other systems are reviewed and negative.    PHYSICAL EXAM: VS:  BP (!) 120/56 (BP Location: Right Arm, Patient Position: Sitting, Cuff Size: Normal)   Pulse 75   Ht 5\' 4"  (1.626 m)   Wt  170 lb (77.1 kg)   SpO2 97%   BMI 29.18 kg/m  , BMI Body mass index is 29.18 kg/m. GEN: Well nourished, well developed, in no acute distress  HEENT: normal  Neck: no JVD, carotid bruits, or masses Cardiac: RRR; no murmurs, rubs, or gallops,no edema  Respiratory:  clear to auscultation bilaterally, normal work of breathing GI: soft, nontender, nondistended, + BS MS: no deformity or atrophy  Skin: warm and dry, no rash Neuro:  Strength and sensation are intact Psych: euthymic mood, full affect   EKG:  EKG is ordered today. The ekg ordered today demonstrates normal sinus rhythm with no significant ST or T wave changes.   Recent Labs: 02/23/2020: Hemoglobin 14.8; Platelets 155 08/22/2020: ALT 16; BUN 16; Creatinine, Ser 1.24; Potassium 4.7; Sodium 142    Lipid Panel    Component Value Date/Time   CHOL 241 (H) 08/22/2020 0956   CHOL CANCELED 10/16/2016 0930   TRIG 158 (H) 08/22/2020 0956   TRIG CANCELED 10/16/2016 0930   HDL 51 08/22/2020 0956   CHOLHDL 6.8 (H) 04/20/2017 0953   LDLCALC 161 (H) 08/22/2020 0956      Wt Readings from Last 3 Encounters:  08/28/20 170 lb (77.1 kg)  08/22/20 172 lb (78 kg)  08/15/20 174 lb 3.2 oz (79 kg)        PAD Screen 08/28/2020  Previous PAD dx? No  Previous surgical procedure? No  Pain with walking? No  Feet/toe relief with dangling? No  Painful, non-healing ulcers? No  Extremities discolored? No      ASSESSMENT AND PLAN:  1.  Atypical chest pain: The patient chest pain is somewhat atypical but nonetheless he reports significant worsening of exertional dyspnea and he has multiple risk factors for coronary artery disease.  Thus, I recommend evaluation with a Lexiscan Myoview. Given that the predominant symptoms of shortness of breath, I also requested an echocardiogram to evaluate his diastolic function and pulmonary pressure.  I discussed with him the importance of controlling his risk factors.  2.  Essential hypertension:  Blood pressure is controlled on current medications.  3.  Hyperlipidemia: Recent lipid profile showed an LDL of 161.  I agree with switching him to a potent statin.  4.  Chronic kidney disease: Most recent labs showed GFR of 57.  5.  Elevated PSA at 14.4.  The patient has an appointment with urology.    Disposition:   FU with me as needed  Signed,  Dean Sacramento, MD  08/28/2020 4:13 PM    [facility]

## 2020-08-30 ENCOUNTER — Ambulatory Visit: Payer: Medicare Other | Admitting: Urology

## 2020-08-30 ENCOUNTER — Other Ambulatory Visit: Payer: Self-pay

## 2020-08-30 ENCOUNTER — Encounter: Payer: Self-pay | Admitting: Urology

## 2020-08-30 VITALS — BP 153/84 | HR 69 | Ht 63.0 in | Wt 169.7 lb

## 2020-08-30 DIAGNOSIS — N401 Enlarged prostate with lower urinary tract symptoms: Secondary | ICD-10-CM

## 2020-08-30 DIAGNOSIS — R972 Elevated prostate specific antigen [PSA]: Secondary | ICD-10-CM

## 2020-08-30 LAB — BLADDER SCAN AMB NON-IMAGING

## 2020-08-30 NOTE — Patient Instructions (Signed)
Prostate Cancer  The prostate is a male gland that helps make semen. Prostate cancer is when abnormal cells grow in this gland. Follow these instructions at home:  Take over-the-counter and prescription medicines only as told by your doctor.  Eat a healthy diet.  Get plenty of sleep.  Ask your doctor for help to find a support group for men with prostate cancer.  Keep all follow-up visits as told by your doctor. This is important.  If you have to go to the hospital, let your cancer doctor (oncologist) know.  Touch, hold, hug, and caress your partner to continue to show sexual feelings. Contact a doctor if:  You have trouble peeing (urinating).  You have blood in your pee (urine).  You have pain in your hips, back, or chest. Get help right away if:  You have weakness in your legs.  You lose feeling (have numbness) in your legs.  You cannot control your pee or your poop (stool).  You have trouble breathing.  You have sudden pain in your chest.  You have chills or a fever. Summary  The prostate is a male gland that helps make semen. Prostate cancer is when abnormal cells grow in this gland.  Ask your doctor for help to find a support group for men with prostate cancer.  Contact a doctor if you have problems peeing or have any new pain that you did not have before. This information is not intended to replace advice given to you by your health care provider. Make sure you discuss any questions you have with your health care provider. Document Revised: 11/27/2017 Document Reviewed: 08/25/2016 Elsevier Patient Education  2020 [place]. Prostate Cancer Screening  Prostate cancer screening is a test that is done to check for the presence of prostate cancer in men. The prostate gland is a walnut-sized gland that is located below the bladder and in front of the rectum in males. The function of the prostate is to add fluid to semen during ejaculation. Prostate cancer is the  second most common type of cancer in men. Who should have prostate cancer screening?  Screening recommendations vary based on age and other risk factors. Screening is recommended if:  You are older than age 107. If you are age 47-69, talk with your health care provider about your need for screening and how often screening should be done. Because most prostate cancers are slow growing and will not cause death, screening is generally reserved in this age group for men who have a 10-15-year life expectancy.  You are younger than age 30, and you have these risk factors: ? Being a black male or a male of African descent. ? Having a father, brother, or uncle who has been diagnosed with prostate cancer. The risk is higher if your family member's cancer occurred at an early age. Screening is not recommended if:  You are younger than age 49.  You are between the ages of 22 and 30 and you have no risk factors.  You are 59 years of age or older. At this age, the risks that screening can cause are greater than the benefits that it may provide. If you are at high risk for prostate cancer, your health care provider may recommend that you have screenings more often or that you start screening at a younger age. How is screening for prostate cancer done? The recommended prostate cancer screening test is a blood test called the prostate-specific antigen (PSA) test. PSA is a protein  that is made in the prostate. As you age, your prostate naturally produces more PSA. Abnormally high PSA levels may be caused by:  Prostate cancer.  An enlarged prostate that is not caused by cancer (benign prostatic hyperplasia, BPH). This condition is very common in older men.  A prostate gland infection (prostatitis). Depending on the PSA results, you may need more tests, such as:  A physical exam to check the size of your prostate gland.  Blood and imaging tests.  A procedure to remove tissue samples from your prostate  gland for testing (biopsy). What are the benefits of prostate cancer screening?  Screening can help to identify cancer at an early stage, before symptoms start and when the cancer can be treated more easily.  There is a small chance that screening may lower your risk of dying from prostate cancer. The chance is small because prostate cancer is a slow-growing cancer, and most men with prostate cancer die from a different cause. What are the risks of prostate cancer screening? The main risk of prostate cancer screening is diagnosing and treating prostate cancer that would never have caused any symptoms or problems. This is called overdiagnosisand overtreatment. PSA screening cannot tell you if your PSA is high due to cancer or a different cause. A prostate biopsy is the only procedure to diagnose prostate cancer. Even the results of a biopsy may not tell you if your cancer needs to be treated. Slow-growing prostate cancer may not need any treatment other than monitoring, so diagnosing and treating it may cause unnecessary stress or other side effects. A prostate biopsy may also cause:  Infection or fever.  A false negative. This is a result that shows that you do not have prostate cancer when you actually do have prostate cancer. Questions to ask your health care provider  When should I start prostate cancer screening?  What is my risk for prostate cancer?  How often do I need screening?  What type of screening tests do I need?  How do I get my test results?  What do my results mean?  Do I need treatment? Where to find more information  The American Cancer Society: www.cancer.org  American Urological Association: www.auanet.org Contact a health care provider if:  You have difficulty urinating.  You have pain when you urinate or ejaculate.  You have blood in your urine or semen.  You have pain in your back or in the area of your prostate. Summary  Prostate cancer is a common  type of cancer in men. The prostate gland is located below the bladder and in front of the rectum. This gland adds fluid to semen during ejaculation.  Prostate cancer screening may identify cancer at an early stage, when the cancer can be treated more easily.  The prostate-specific antigen (PSA) test is the recommended screening test for prostate cancer.  Discuss the risks and benefits of prostate cancer screening with your health care provider. If you are age 73 or older, the risks that screening can cause are greater than the benefits that it may provide. This information is not intended to replace advice given to you by your health care provider. Make sure you discuss any questions you have with your health care provider. Document Revised: 07/28/2019 Document Reviewed: 07/28/2019 Elsevier Patient Education  [place].

## 2020-08-30 NOTE — Progress Notes (Signed)
08/30/20 9:28 AM   Fenton Malling 1946/04/11 [id number]  CC: Elevated PSA  HPI: I saw Mr. Gonzaga and his daughter in urology clinic for evaluation of elevated PSA.  He is a 74 year old male with medical history notable for bipolar disorder and stroke who presents with a persistently rising PSA.  PSA was 4.1 in April 2017, 7 in April 2018, 10.7 in February 2021, and 14.4 in August 2021.  He has never been evaluated by urology previously, and denies any history of prostate biopsy.  He has a family history of nonlethal prostate cancer in his brother.  He denies any urinary symptoms and IPSS score today is 4, with quality of life mostly satisfied.  He denies any bone pain.  He has had some weight loss but has been on a diet.  He denies any gross hematuria or flank pain.  There is no prior cross-sectional imaging to review.  He is scheduled to undergo an echo and stress test next week for recent abnormal EKG.   PMH: Past Medical History:  Diagnosis Date  . Arthritis   . Bipolar disorder ([place])   . CVA (cerebral vascular accident) [facility])    Age 82 - no deficits  . Diverticulitis of colon   . GERD (gastroesophageal reflux disease)   . Hyperlipidemia   . Iron deficiency anemia     Surgical History: Past Surgical History:  Procedure Laterality Date  . ESOPHAGOGASTRODUODENOSCOPY (EGD) WITH PROPOFOL N/A 05/23/2016   Procedure: ESOPHAGOGASTRODUODENOSCOPY (EGD) WITH PROPOFOL with dialation;  Surgeon: Lucilla Lame, MD;  Location: [place];  Service: Endoscopy;  Laterality: N/A;  . ESOPHAGOGASTRODUODENOSCOPY (EGD) WITH PROPOFOL N/A 06/14/2019   Procedure: ESOPHAGOGASTRODUODENOSCOPY (EGD) WITH PROPOFOL;  Surgeon: Lucilla Lame, MD;  Location: [facility] ENDOSCOPY;  Service: Endoscopy;  Laterality: N/A;  . HERNIA REPAIR    . ORTHOPEDIC SURGERY     Surgery for broken leg and toe    Family History: Family History  Problem Relation Age of Onset  . Stroke Father   . Diabetes Brother      Social History:  reports that he quit smoking about 41 years ago. His smokeless tobacco use includes snuff. He reports that he does not drink alcohol and does not use drugs.  Physical Exam: BP (!) 153/84 (BP Location: Left Arm, Patient Position: Sitting, Cuff Size: Normal)   Pulse 69   Ht 5\' 3"  (1.6 m)   Wt 169 lb 11.2 oz (77 kg)   BMI 30.06 kg/m    Constitutional: Flat affect Cardiovascular: No clubbing, cyanosis, or edema. Respiratory: Normal respiratory effort, no increased work of breathing. GI: Abdomen is soft, nontender, nondistended, no abdominal masses DRE: 30 g prostate, smooth, no nodules or masses  Laboratory Data: See HPI for PSA history  Pertinent Imaging: None to review  Assessment & Plan:   In summary, is a comorbid 74 year old male with a persistently rising PSA, most recently 39, normal DRE, and family history of prostate cancer.  He is currently undergoing cardiac work-up with stress test and echo scheduled for next week.  We reviewed the implications of an elevated PSA and the uncertainty surrounding it. In general, a man's PSA increases with age and is produced by both normal and cancerous prostate tissue. The differential diagnosis for elevated PSA includes BPH, prostate cancer, infection, recent intercourse/ejaculation, recent urethroscopic manipulation (foley placement/cystoscopy) or trauma, and prostatitis.   Management of an elevated PSA can include observation or prostate biopsy and we discussed this in detail. Our goal is  to detect clinically significant prostate cancers, and manage with either active surveillance, surgery, or radiation for localized disease. Risks of prostate biopsy include bleeding, infection (including life threatening sepsis), pain, and lower urinary symptoms. Hematuria, hematospermia, and blood in the stool are all common after biopsy and can persist up to 4 weeks.   I was very frank with the patient and his daughter that his PSA  trend is very concerning for prostate cancer.  We discussed the different risk categories for prostate cancer, and that our goal is to detect clinically significant cancers that will cause symptoms or shorten life expectancy.  We discussed that were in a challenging position with his age and comorbidities but concerning PSA values, as well as pending cardiac work-up.  I recommended completing cardiac work-up, and close follow-up in 2 to 3 months with a repeat PSA and consider biopsy at that time pending cardiology findings.  RTC 2 to 3 months with PSA prior  Nickolas Madrid, MD 08/30/2020  [facility] [place] [address] [place], [place] [address] [phone]

## 2020-09-04 ENCOUNTER — Encounter
Admission: RE | Admit: 2020-09-04 | Discharge: 2020-09-04 | Disposition: A | Discharge status: Home / Self Care | Payer: Medicare Other | Source: Ambulatory Visit | Attending: Cardiovascular Disease | Admitting: Cardiovascular Disease

## 2020-09-04 ENCOUNTER — Other Ambulatory Visit: Payer: Self-pay

## 2020-09-04 DIAGNOSIS — R079 Chest pain, unspecified: Secondary | ICD-10-CM | POA: Diagnosis not present

## 2020-09-04 MED ORDER — TECHNETIUM TC 99M TETROFOSMIN IV KIT
30.0000 | PACK | Freq: Once | INTRAVENOUS | Status: AC | PRN
  Administered 2020-09-04: 31.694 via INTRAVENOUS

## 2020-09-04 MED ORDER — TECHNETIUM TC 99M TETROFOSMIN IV KIT
10.0000 | PACK | Freq: Once | INTRAVENOUS | Status: AC | PRN
  Administered 2020-09-04: 9.74 via INTRAVENOUS

## 2020-09-04 MED ORDER — REGADENOSON 0.4 MG/5ML IV SOLN
0.4000 mg | Freq: Once | INTRAVENOUS | Status: AC
  Administered 2020-09-04: 0.4 mg via INTRAVENOUS

## 2020-09-05 LAB — NM MYOCAR MULTI W/SPECT W/WALL MOTION / EF
Estimated workload: 1 METS
Exercise duration (min): 0 min
Exercise duration (sec): 0 s
LV dias vol: 79 mL (ref 62–150)
LV sys vol: 31 mL
MPHR: 145 {beats}/min
Peak HR: 85 {beats}/min
Percent HR: 58 %
Rest HR: 61 {beats}/min
TID: 1

## 2020-09-07 ENCOUNTER — Telehealth: Payer: Self-pay

## 2020-09-07 NOTE — Telephone Encounter (Signed)
-----   Message from Wellington Hampshire, MD sent at 09/07/2020 11:52 AM EDT ----- Inform patient that  stress test was normal.  He does have mild coronary and aortic atherosclerosis and should continue low-dose aspirin and atorvastatin.

## 2020-09-07 NOTE — Telephone Encounter (Signed)
Called to give the patient stress test results. NO DPR to leave a detailed msg. lmtcb for results.

## 2020-09-18 ENCOUNTER — Other Ambulatory Visit: Payer: Self-pay | Admitting: Family Medicine

## 2020-09-18 DIAGNOSIS — F3170 Bipolar disorder, currently in remission, most recent episode unspecified: Secondary | ICD-10-CM

## 2020-09-18 NOTE — Telephone Encounter (Signed)
Patient last seen 08/22/20.

## 2020-09-18 NOTE — Telephone Encounter (Signed)
Requested medication (s) are due for refill today: yes  Requested medication (s) are on the active medication list: yes  Last refill:  02/23/20 #180 1 refill  Future visit scheduled: yes in 6 months  Notes to clinic:  not delegated per protocol     Requested Prescriptions  Pending Prescriptions Disp Refills   divalproex (DEPAKOTE ER) 250 MG 24 hr tablet [Pharmacy Med Name: Divalproex Sodium ER 250 MG Oral Tablet Extended Release 24 Hour] 180 tablet 0    Sig: Take 1 tablet by mouth twice daily      Not Delegated - Neurology:  Anticonvulsants - Valproates Failed - 09/18/2020  4:41 PM      Failed - This refill cannot be delegated      Failed - Valproic Acid (serum) in normal range and within 360 days    No results found for: VALPROATE, VPAT        Passed - AST in normal range and within 360 days    AST  Date Value Ref Range Status  08/22/2020 24 0 - 40 IU/L Final   AST (SGOT) Piccolo, Waived  Date Value Ref Range Status  10/16/2016 23 11 - 38 U/L Final          Passed - ALT in normal range and within 360 days    ALT  Date Value Ref Range Status  08/22/2020 16 0 - 44 IU/L Final   ALT (SGPT) Piccolo, Waived  Date Value Ref Range Status  10/16/2016 26 10 - 47 U/L Final          Passed - HGB in normal range and within 360 days    Hemoglobin  Date Value Ref Range Status  02/23/2020 14.8 13.0 - 17.7 g/dL Final          Passed - PLT in normal range and within 360 days    Platelets  Date Value Ref Range Status  02/23/2020 155 150 - 450 x10E3/uL Final          Passed - WBC in normal range and within 360 days    WBC  Date Value Ref Range Status  02/23/2020 3.7 3.4 - 10.8 x10E3/uL Final          Passed - HCT in normal range and within 360 days    Hematocrit  Date Value Ref Range Status  02/23/2020 44.6 37.5 - 51.0 % Final          Passed - Valid encounter within last 12 months    Recent Outpatient Visits           3 weeks ago Hypertensive kidney disease  with stage 3 chronic kidney disease, unspecified whether stage 3a or 3b CKD   [facility] Volney American, PA-C   1 month ago [place], [place], [place]   6 months ago Flu vaccine need   [facility], Lilia Argue, [place]   1 year ago Other hyperlipidemia   [place], Lilia Argue, [place]   1 year ago Essential hypertension   [place], Jeannette How, [place]       Future Appointments             In 2 months Sn[place], Herbert Seta, MD [place]   In 6 months  [facility], [place]

## 2020-09-20 ENCOUNTER — Ambulatory Visit: Payer: Medicare Other | Admitting: Nurse Practitioner

## 2020-09-25 ENCOUNTER — Ambulatory Visit (INDEPENDENT_AMBULATORY_CARE_PROVIDER_SITE_OTHER): Payer: Medicare Other

## 2020-09-25 ENCOUNTER — Other Ambulatory Visit: Payer: Self-pay

## 2020-09-25 DIAGNOSIS — R0602 Shortness of breath: Secondary | ICD-10-CM

## 2020-09-25 LAB — ECHOCARDIOGRAM COMPLETE
AR max vel: 1.43 cm2
AV Area VTI: 1.64 cm2
AV Area mean vel: 1.48 cm2
AV Mean grad: 7 mmHg
AV Peak grad: 13.8 mmHg
Ao pk vel: 1.86 m/s
Area-P 1/2: 2.46 cm2
P 1/2 time: 474 msec
S' Lateral: 3.6 cm

## 2020-09-28 ENCOUNTER — Telehealth: Payer: Self-pay

## 2020-09-28 NOTE — Telephone Encounter (Signed)
Called to give the patient echo results. lmtcb. 

## 2020-09-28 NOTE — Telephone Encounter (Signed)
-----   Message from Wellington Hampshire, MD sent at 09/28/2020 11:44 AM EDT ----- Echo showed normal ejection fraction.  However, his aortic valve was abnormal with mild to moderate regurgitation.  There was also mild mitral regurgitation.  None of these findings are significant enough to require intervention but should be monitored.  He should follow-up with [place] in 6 months.

## 2020-10-02 NOTE — Telephone Encounter (Signed)
2nd attempt lmtcb

## 2020-10-02 NOTE — Telephone Encounter (Signed)
DPR on file. Patients daughter Anderson Malta made aware of echo results with verbalized understanding.

## 2020-10-11 ENCOUNTER — Encounter: Payer: Self-pay | Admitting: Nurse Practitioner

## 2020-10-11 DIAGNOSIS — I700 Atherosclerosis of aorta: Secondary | ICD-10-CM | POA: Insufficient documentation

## 2020-10-15 ENCOUNTER — Encounter: Payer: Self-pay | Admitting: Nurse Practitioner

## 2020-10-15 ENCOUNTER — Other Ambulatory Visit: Payer: Self-pay

## 2020-10-15 ENCOUNTER — Ambulatory Visit (INDEPENDENT_AMBULATORY_CARE_PROVIDER_SITE_OTHER): Payer: Medicare Other | Admitting: Nurse Practitioner

## 2020-10-15 VITALS — BP 136/71 | HR 72 | Temp 98.7°F | Resp 16 | Wt 164.0 lb

## 2020-10-15 DIAGNOSIS — I700 Atherosclerosis of aorta: Secondary | ICD-10-CM

## 2020-10-15 DIAGNOSIS — I10 Essential (primary) hypertension: Secondary | ICD-10-CM | POA: Diagnosis not present

## 2020-10-15 MED ORDER — LOSARTAN POTASSIUM 50 MG PO TABS
50.0000 mg | ORAL_TABLET | Freq: Every day | ORAL | 4 refills | Status: DC
Start: 2020-10-15 — End: 2021-05-21

## 2020-10-15 NOTE — Patient Instructions (Signed)
DASH Eating Plan DASH stands for "Dietary Approaches to Stop Hypertension." The DASH eating plan is a healthy eating plan that has been shown to reduce high blood pressure (hypertension). It may also reduce your risk for type 2 diabetes, heart disease, and stroke. The DASH eating plan may also help with weight loss. What are tips for following this plan?  General guidelines  Avoid eating more than 2,300 mg (milligrams) of salt (sodium) a day. If you have hypertension, you may need to reduce your sodium intake to 1,500 mg a day.  Limit alcohol intake to no more than 1 drink a day for nonpregnant women and 2 drinks a day for men. One drink equals 12 oz of beer, 5 oz of wine, or 1 oz of hard liquor.  Work with your health care provider to maintain a healthy body weight or to lose weight. Ask what an ideal weight is for you.  Get at least 30 minutes of exercise that causes your heart to beat faster (aerobic exercise) most days of the week. Activities may include walking, swimming, or biking.  Work with your health care provider or diet and nutrition specialist (dietitian) to adjust your eating plan to your individual calorie needs. Reading food labels   Check food labels for the amount of sodium per serving. Choose foods with less than 5 percent of the Daily Value of sodium. Generally, foods with less than 300 mg of sodium per serving fit into this eating plan.  To find whole grains, look for the word "whole" as the first word in the ingredient list. Shopping  Buy products labeled as "low-sodium" or "no salt added."  Buy fresh foods. Avoid canned foods and premade or frozen meals. Cooking  Avoid adding salt when cooking. Use salt-free seasonings or herbs instead of table salt or sea salt. Check with your health care provider or pharmacist before using salt substitutes.  Do not fry foods. Cook foods using healthy methods such as baking, boiling, grilling, and broiling instead.  Cook with  heart-healthy oils, such as olive, canola, soybean, or sunflower oil. Meal planning  Eat a balanced diet that includes: ? 5 or more servings of fruits and vegetables each day. At each meal, try to fill half of your plate with fruits and vegetables. ? Up to 6-8 servings of whole grains each day. ? Less than 6 oz of lean meat, poultry, or fish each day. A 3-oz serving of meat is about the same size as a deck of cards. One egg equals 1 oz. ? 2 servings of low-fat dairy each day. ? A serving of nuts, seeds, or beans 5 times each week. ? Heart-healthy fats. Healthy fats called Omega-3 fatty acids are found in foods such as flaxseeds and coldwater fish, like sardines, salmon, and mackerel.  Limit how much you eat of the following: ? Canned or prepackaged foods. ? Food that is high in trans fat, such as fried foods. ? Food that is high in saturated fat, such as fatty meat. ? Sweets, desserts, sugary drinks, and other foods with added sugar. ? Full-fat dairy products.  Do not salt foods before eating.  Try to eat at least 2 vegetarian meals each week.  Eat more home-cooked food and less restaurant, buffet, and fast food.  When eating at a restaurant, ask that your food be prepared with less salt or no salt, if possible. What foods are recommended? The items listed may not be a complete list. Talk with your dietitian about   what dietary choices are best for you. Grains Whole-grain or whole-wheat bread. Whole-grain or whole-wheat pasta. Brown rice. Oatmeal. Quinoa. Bulgur. Whole-grain and low-sodium cereals. Pita bread. Low-fat, low-sodium crackers. Whole-wheat flour tortillas. Vegetables Fresh or frozen vegetables (raw, steamed, roasted, or grilled). Low-sodium or reduced-sodium tomato and vegetable juice. Low-sodium or reduced-sodium tomato sauce and tomato paste. Low-sodium or reduced-sodium canned vegetables. Fruits All fresh, dried, or frozen fruit. Canned fruit in natural juice (without  added sugar). Meat and other protein foods Skinless chicken or turkey. Ground chicken or turkey. Pork with fat trimmed off. Fish and seafood. Egg whites. Dried beans, peas, or lentils. Unsalted nuts, nut butters, and seeds. Unsalted canned beans. Lean cuts of beef with fat trimmed off. Low-sodium, lean deli meat. Dairy Low-fat (1%) or fat-free (skim) milk. Fat-free, low-fat, or reduced-fat cheeses. Nonfat, low-sodium ricotta or cottage cheese. Low-fat or nonfat yogurt. Low-fat, low-sodium cheese. Fats and oils Soft margarine without trans fats. Vegetable oil. Low-fat, reduced-fat, or light mayonnaise and salad dressings (reduced-sodium). Canola, safflower, olive, soybean, and sunflower oils. Avocado. Seasoning and other foods Herbs. Spices. Seasoning mixes without salt. Unsalted popcorn and pretzels. Fat-free sweets. What foods are not recommended? The items listed may not be a complete list. Talk with your dietitian about what dietary choices are best for you. Grains Baked goods made with fat, such as croissants, muffins, or some breads. Dry pasta or rice meal packs. Vegetables Creamed or fried vegetables. Vegetables in a cheese sauce. Regular canned vegetables (not low-sodium or reduced-sodium). Regular canned tomato sauce and paste (not low-sodium or reduced-sodium). Regular tomato and vegetable juice (not low-sodium or reduced-sodium). Pickles. Olives. Fruits Canned fruit in a light or heavy syrup. Fried fruit. Fruit in cream or butter sauce. Meat and other protein foods Fatty cuts of meat. Ribs. Fried meat. Bacon. Sausage. Bologna and other processed lunch meats. Salami. Fatback. Hotdogs. Bratwurst. Salted nuts and seeds. Canned beans with added salt. Canned or smoked fish. Whole eggs or egg yolks. Chicken or turkey with skin. Dairy Whole or 2% milk, cream, and half-and-half. Whole or full-fat cream cheese. Whole-fat or sweetened yogurt. Full-fat cheese. Nondairy creamers. Whipped toppings.  Processed cheese and cheese spreads. Fats and oils Butter. Stick margarine. Lard. Shortening. Ghee. Bacon fat. Tropical oils, such as coconut, palm kernel, or palm oil. Seasoning and other foods Salted popcorn and pretzels. Onion salt, garlic salt, seasoned salt, table salt, and sea salt. Worcestershire sauce. Tartar sauce. Barbecue sauce. Teriyaki sauce. Soy sauce, including reduced-sodium. Steak sauce. Canned and packaged gravies. Fish sauce. Oyster sauce. Cocktail sauce. Horseradish that you find on the shelf. Ketchup. Mustard. Meat flavorings and tenderizers. Bouillon cubes. Hot sauce and Tabasco sauce. Premade or packaged marinades. Premade or packaged taco seasonings. Relishes. Regular salad dressings. Where to find more information:  National Heart, Lung, and Blood Institute: www.nhlbi.nih.gov  American Heart Association: www.heart.org Summary  The DASH eating plan is a healthy eating plan that has been shown to reduce high blood pressure (hypertension). It may also reduce your risk for type 2 diabetes, heart disease, and stroke.  With the DASH eating plan, you should limit salt (sodium) intake to 2,300 mg a day. If you have hypertension, you may need to reduce your sodium intake to 1,500 mg a day.  When on the DASH eating plan, aim to eat more fresh fruits and vegetables, whole grains, lean proteins, low-fat dairy, and heart-healthy fats.  Work with your health care provider or diet and nutrition specialist (dietitian) to adjust your eating plan to your   individual calorie needs. This information is not intended to replace advice given to you by your health care provider. Make sure you discuss any questions you have with your health care provider. Document Revised: 11/27/2017 Document Reviewed: 12/08/2016 Elsevier Patient Education  2020 Elsevier Inc.  

## 2020-10-15 NOTE — Assessment & Plan Note (Signed)
Chronic, stable with BP below goal in office and on home readings.  Cough improving with discontinuation of ACE.  Will continue Losartan at this time and adjust as needed.  Recommend they continue to monitor BP daily at home and document + focus on DASH diet.  BMP today.  Return to office in 6 months.

## 2020-10-15 NOTE — Progress Notes (Signed)
BP 136/71 (BP Location: Left Arm, Patient Position: Sitting, Cuff Size: Normal)   Pulse 72   Temp 98.7 F (37.1 C) (Oral)   Resp 16   Wt 164 lb (74.4 kg)   SpO2 97%   BMI 29.05 kg/m    Subjective:    Patient ID: Dean Larsen, male    DOB: [birth year], 75 y.o.   MRN: [id number]  HPI: Dean Larsen is a 74 y.o. male  Chief Complaint  Patient presents with  . Hypertension   HYPERTENSION Recent change from Lisinopril on 08/22/20 to Losartan due to cough.  He reports still a little cough, but not as much as previously.  Not a current smoker, not smoked since 1980's, but does dip daily (multiple times a day).  Saw cardiology on 08/28/20 last.  Recent Myoview noted aortic atherosclerosis. Hypertension status: stable  Satisfied with current treatment? yes Duration of hypertension: chronic BP monitoring frequency:  daily BP range: 130/70 range BP medication side effects:  no Medication compliance: good compliance Aspirin: yes Recurrent headaches: no Visual changes: no Palpitations: no Dyspnea: no Chest pain: no Lower extremity edema: no Dizzy/lightheaded: no  Relevant past medical, surgical, family and social history reviewed and updated as indicated. Interim medical history since our last visit reviewed. Allergies and medications reviewed and updated.  Review of Systems  Constitutional: Negative for activity change, diaphoresis, fatigue and fever.  Respiratory: Negative for cough, chest tightness, shortness of breath and wheezing.   Cardiovascular: Negative for chest pain, palpitations and leg swelling.  Gastrointestinal: Negative.   Neurological: Negative.   Psychiatric/Behavioral: Negative.     Per HPI unless specifically indicated above     Objective:    BP 136/71 (BP Location: Left Arm, Patient Position: Sitting, Cuff Size: Normal)   Pulse 72   Temp 98.7 F (37.1 C) (Oral)   Resp 16   Wt 164 lb (74.4 kg)   SpO2 97%   BMI 29.05 kg/m   Wt Readings from  Last 3 Encounters:  10/15/20 164 lb (74.4 kg)  08/30/20 169 lb 11.2 oz (77 kg)  08/28/20 170 lb (77.1 kg)    Physical Exam Vitals and nursing note reviewed.  Constitutional:      General: He is awake. He is not in acute distress.    Appearance: He is well-developed and well-groomed. He is not ill-appearing or toxic-appearing.  HENT:     Head: Normocephalic and atraumatic.     Right Ear: Hearing normal. No drainage.     Left Ear: Hearing normal. No drainage.  Eyes:     General: Lids are normal.        Right eye: No discharge.        Left eye: No discharge.     Conjunctiva/sclera: Conjunctivae normal.     Pupils: Pupils are equal, round, and reactive to light.  Neck:     Vascular: No carotid bruit.     Trachea: Trachea normal.  Cardiovascular:     Rate and Rhythm: Normal rate and regular rhythm.     Heart sounds: Normal heart sounds, S1 normal and S2 normal. No murmur heard.  No gallop.   Pulmonary:     Effort: Pulmonary effort is normal. No accessory muscle usage or respiratory distress.     Breath sounds: Normal breath sounds.  Abdominal:     General: Bowel sounds are normal.     Palpations: Abdomen is soft.  Musculoskeletal:        General: Normal range of  motion.     Cervical back: Normal range of motion and neck supple.     Right lower leg: No edema.     Left lower leg: No edema.  Skin:    General: Skin is warm and dry.     Capillary Refill: Capillary refill takes less than 2 seconds.     Findings: No rash.  Neurological:     Mental Status: He is alert and oriented to person, place, and time.     Deep Tendon Reflexes: Reflexes are normal and symmetric.  Psychiatric:        Attention and Perception: Attention normal.        Mood and Affect: Mood normal.        Speech: Speech normal.        Behavior: Behavior normal. Behavior is cooperative.        Thought Content: Thought content normal.     Results for orders placed or performed in visit on 09/25/20    ECHOCARDIOGRAM COMPLETE  Result Value Ref Range   AR max vel 1.43 cm2   AV Peak grad 13.8 mmHg   Ao pk vel 1.86 m/s   S' Lateral 3.60 cm   Area-P 1/2 2.46 cm2   AV Area VTI 1.64 cm2   AV Mean grad 7.0 mmHg   P 1/2 time 474 msec   AV Area mean vel 1.48 cm2      Assessment & Plan:   Problem List Items Addressed This Visit      Cardiovascular and Mediastinum   Essential hypertension - Primary    Chronic, stable with BP below goal in office and on home readings.  Cough improving with discontinuation of ACE.  Will continue Losartan at this time and adjust as needed.  Recommend they continue to monitor BP daily at home and document + focus on DASH diet.  BMP today.  Return to office in 6 months.      Relevant Medications   losartan (COZAAR) 50 MG tablet   Other Relevant Orders   Basic metabolic panel   Aortic atherosclerosis ([place])    Noted on Myo 09/04/2020, educated his daughter and him on this.  Continue daily statin and ASA for prevention.  Recommend cessation of dip tobacco.      Relevant Medications   losartan (COZAAR) 50 MG tablet       Follow up plan: Return in about 6 months (around 04/15/2021) for HTN/HLD, MOOD with PCP.

## 2020-10-15 NOTE — Assessment & Plan Note (Signed)
Noted on Myo 09/04/2020, educated his daughter and him on this.  Continue daily statin and ASA for prevention.  Recommend cessation of dip tobacco.

## 2020-10-16 LAB — BASIC METABOLIC PANEL
BUN/Creatinine Ratio: 17 (ref 10–24)
BUN: 24 mg/dL (ref 8–27)
CO2: 25 mmol/L (ref 20–29)
Calcium: 9.7 mg/dL (ref 8.6–10.2)
Chloride: 99 mmol/L (ref 96–106)
Creatinine, Ser: 1.39 mg/dL — ABNORMAL HIGH (ref 0.76–1.27)
GFR calc Af Amer: 57 mL/min/{1.73_m2} — ABNORMAL LOW (ref >59)
GFR calc non Af Amer: 49 mL/min/{1.73_m2} — ABNORMAL LOW (ref >59)
Glucose: 93 mg/dL (ref 65–99)
Potassium: 4.5 mmol/L (ref 3.5–5.2)
Sodium: 138 mmol/L (ref 134–144)

## 2020-10-16 NOTE — Progress Notes (Signed)
Contacted via MyChart  Good afternoon Mr. Boeckman.  Your labs have returned.  Electrolytes look good.  Kidneys continue to show some mild kidney disease which is baseline for you.  Continue Losartan as this is kidney protective.  We will continue to monitor kidney function at visits.  If any questions let me know.  Have a great day!! Keep being awesome!!  Thank you for allowing me to participate in your care. Kindest regards, Shateria Paternostro

## 2020-10-23 ENCOUNTER — Other Ambulatory Visit: Payer: Self-pay | Admitting: Family Medicine

## 2020-10-23 DIAGNOSIS — F3170 Bipolar disorder, currently in remission, most recent episode unspecified: Secondary | ICD-10-CM

## 2020-11-23 ENCOUNTER — Other Ambulatory Visit: Payer: Self-pay | Admitting: Family Medicine

## 2020-11-26 ENCOUNTER — Other Ambulatory Visit: Payer: Medicare Other

## 2020-11-26 ENCOUNTER — Other Ambulatory Visit: Payer: Self-pay

## 2020-11-26 DIAGNOSIS — R972 Elevated prostate specific antigen [PSA]: Secondary | ICD-10-CM

## 2020-11-27 LAB — PSA TOTAL (REFLEX TO FREE): Prostate Specific Ag, Serum: 11.3 ng/mL — ABNORMAL HIGH (ref 0.0–4.0)

## 2020-11-29 ENCOUNTER — Ambulatory Visit: Payer: Medicare Other | Admitting: Urology

## 2020-11-29 ENCOUNTER — Encounter: Payer: Self-pay | Admitting: Urology

## 2020-11-29 ENCOUNTER — Other Ambulatory Visit: Payer: Self-pay

## 2020-11-29 VITALS — BP 181/81 | HR 74 | Ht 63.0 in | Wt 164.0 lb

## 2020-11-29 DIAGNOSIS — R972 Elevated prostate specific antigen [PSA]: Secondary | ICD-10-CM | POA: Diagnosis not present

## 2020-11-29 DIAGNOSIS — R350 Frequency of micturition: Secondary | ICD-10-CM

## 2020-11-29 NOTE — Patient Instructions (Signed)
Prostate Cancer Screening  Prostate cancer screening is a test that is done to check for the presence of prostate cancer in men. The prostate gland is a walnut-sized gland that is located below the bladder and in front of the rectum in males. The function of the prostate is to add fluid to semen during ejaculation. Prostate cancer is the second most common type of cancer in men. Who should have prostate cancer screening?  Screening recommendations vary based on age and other risk factors. Screening is recommended if:  You are older than age 55. If you are age 55-69, talk with your health care provider about your need for screening and how often screening should be done. Because most prostate cancers are slow growing and will not cause death, screening is generally reserved in this age group for men who have a 10-15-year life expectancy.  You are younger than age 55, and you have these risk factors: ? Being a black male or a male of African descent. ? Having a father, brother, or uncle who has been diagnosed with prostate cancer. The risk is higher if your family member's cancer occurred at an early age. Screening is not recommended if:  You are younger than age 40.  You are between the ages of 40 and 54 and you have no risk factors.  You are 75 years of age or older. At this age, the risks that screening can cause are greater than the benefits that it may provide. If you are at high risk for prostate cancer, your health care provider may recommend that you have screenings more often or that you start screening at a younger age. How is screening for prostate cancer done? The recommended prostate cancer screening test is a blood test called the prostate-specific antigen (PSA) test. PSA is a protein that is made in the prostate. As you age, your prostate naturally produces more PSA. Abnormally high PSA levels may be caused by:  Prostate cancer.  An enlarged prostate that is not caused by cancer  (benign prostatic hyperplasia, BPH). This condition is very common in older men.  A prostate gland infection (prostatitis). Depending on the PSA results, you may need more tests, such as:  A physical exam to check the size of your prostate gland.  Blood and imaging tests.  A procedure to remove tissue samples from your prostate gland for testing (biopsy). What are the benefits of prostate cancer screening?  Screening can help to identify cancer at an early stage, before symptoms start and when the cancer can be treated more easily.  There is a small chance that screening may lower your risk of dying from prostate cancer. The chance is small because prostate cancer is a slow-growing cancer, and most men with prostate cancer die from a different cause. What are the risks of prostate cancer screening? The main risk of prostate cancer screening is diagnosing and treating prostate cancer that would never have caused any symptoms or problems. This is called overdiagnosisand overtreatment. PSA screening cannot tell you if your PSA is high due to cancer or a different cause. A prostate biopsy is the only procedure to diagnose prostate cancer. Even the results of a biopsy may not tell you if your cancer needs to be treated. Slow-growing prostate cancer may not need any treatment other than monitoring, so diagnosing and treating it may cause unnecessary stress or other side effects. A prostate biopsy may also cause:  Infection or fever.  A false negative. This is   a result that shows that you do not have prostate cancer when you actually do have prostate cancer. Questions to ask your health care provider  When should I start prostate cancer screening?  What is my risk for prostate cancer?  How often do I need screening?  What type of screening tests do I need?  How do I get my test results?  What do my results mean?  Do I need treatment? Where to find more information  The American Cancer  Society: www.cancer.org  American Urological Association: www.auanet.org Contact a health care provider if:  You have difficulty urinating.  You have pain when you urinate or ejaculate.  You have blood in your urine or semen.  You have pain in your back or in the area of your prostate. Summary  Prostate cancer is a common type of cancer in men. The prostate gland is located below the bladder and in front of the rectum. This gland adds fluid to semen during ejaculation.  Prostate cancer screening may identify cancer at an early stage, when the cancer can be treated more easily.  The prostate-specific antigen (PSA) test is the recommended screening test for prostate cancer.  Discuss the risks and benefits of prostate cancer screening with your health care provider. If you are age 75 or older, the risks that screening can cause are greater than the benefits that it may provide. This information is not intended to replace advice given to you by your health care provider. Make sure you discuss any questions you have with your health care provider. Document Revised: 07/28/2019 Document Reviewed: 07/28/2019 Elsevier Patient Education  2020 Elsevier Inc.  

## 2020-11-29 NOTE — Progress Notes (Signed)
   11/29/2020 9:30 AM   Dean Larsen 1946-04-04 [id number]  Reason for visit: Elevated PSA, urinary symptoms  HPI: I saw Dean Larsen and his daughter back in urology clinic for follow-up of elevated PSA.  Most of the history is obtained from his daughter today.  Briefly, he is a 74 year old male with a medical history notable for bipolar disorder and stroke who I saw in September 2021 for a persistently rising PSA.  PSA was 4.1 in April 2017, 7 in April 2018, 10.7 in February 2021, and 14.4 in August 2021.  DRE was normal at that time.  He has mild urinary symptoms of urgency and frequency during the day, but he drinks only diet sodas during the day which is the likely etiology of his urinary symptoms.  We previously discussed options including biopsy or trending the PSA.  He has metal in his knee and cannot get an MRI.  He opted for repeat PSA in 3 months.  I reviewed the results of his recent stress test which were normal with 61% EF and no significant ischemia or scar.  Repeat PSA this week came down to 11.3, which is essentially stable from 9 months ago and it was 10.7, and only slightly higher than 7 three years ago.  We had a very long conversation today about options moving forward including prostate biopsy or trending the PSA.  We discussed the risks and benefits of biopsy at length, as well as the AUA guidelines that do not recommend routine screening in men over age 73, and that a normal PSA value over age 89 is <6.5.  We also discussed the risk of developing metastatic prostate cancer and missing a window of cure by deferring biopsy.  We also discussed risks of potential treatment for prostate cancer including radiation/ADT, worsening urinary symptoms, gross hematuria, bowel symptoms, etc.  He understands these risks.  They would like to trend the PSA which is very reasonable with his comorbidities, and relatively stable PSA over the last 3 years.  RTC 6 months with PSA reflex to free  prior  [name], MD  [facility] [address], [place], [place] [address] [phone]

## 2021-01-02 ENCOUNTER — Other Ambulatory Visit: Payer: Self-pay

## 2021-01-02 ENCOUNTER — Other Ambulatory Visit: Payer: Self-pay | Admitting: Family Medicine

## 2021-01-02 DIAGNOSIS — F3170 Bipolar disorder, currently in remission, most recent episode unspecified: Secondary | ICD-10-CM

## 2021-01-02 DIAGNOSIS — Z79899 Other long term (current) drug therapy: Secondary | ICD-10-CM

## 2021-01-02 NOTE — Telephone Encounter (Signed)
I don't have a depakote level on this patient in the system. Needs labs to get refill- I dont' want him to run out, so if he's out, I can send 30 day supply, but will need labs ASAP

## 2021-01-08 ENCOUNTER — Other Ambulatory Visit: Payer: Self-pay | Admitting: Family Medicine

## 2021-01-08 DIAGNOSIS — Z79899 Other long term (current) drug therapy: Secondary | ICD-10-CM

## 2021-01-08 NOTE — Telephone Encounter (Signed)
All set!

## 2021-01-08 NOTE — Progress Notes (Signed)
dep 

## 2021-01-08 NOTE — Telephone Encounter (Signed)
Dr. Wynetta Emery, can you enter lab orders for this patient please?

## 2021-01-09 ENCOUNTER — Other Ambulatory Visit: Payer: Medicare Other

## 2021-01-09 ENCOUNTER — Other Ambulatory Visit: Payer: Self-pay

## 2021-01-09 DIAGNOSIS — Z79899 Other long term (current) drug therapy: Secondary | ICD-10-CM

## 2021-01-10 LAB — VALPROIC ACID LEVEL: Valproic Acid Lvl: <4 ug/mL — ABNORMAL LOW (ref 50–100)

## 2021-01-14 ENCOUNTER — Other Ambulatory Visit: Payer: Self-pay | Admitting: Family Medicine

## 2021-01-14 DIAGNOSIS — F3170 Bipolar disorder, currently in remission, most recent episode unspecified: Secondary | ICD-10-CM

## 2021-01-14 MED ORDER — DIVALPROEX SODIUM ER 250 MG PO TB24: 250.0000 mg | ORAL_TABLET | Freq: Two times a day (BID) | ORAL | 0 refills | Status: DC

## 2021-01-21 ENCOUNTER — Other Ambulatory Visit: Payer: Self-pay | Admitting: Nurse Practitioner

## 2021-01-21 DIAGNOSIS — F3170 Bipolar disorder, currently in remission, most recent episode unspecified: Secondary | ICD-10-CM

## 2021-04-15 ENCOUNTER — Ambulatory Visit (INDEPENDENT_AMBULATORY_CARE_PROVIDER_SITE_OTHER): Payer: Medicare Other

## 2021-04-15 VITALS — Ht 64.0 in | Wt 168.0 lb

## 2021-04-15 DIAGNOSIS — Z0000 Encounter for general adult medical examination without abnormal findings: Secondary | ICD-10-CM

## 2021-04-15 NOTE — Patient Instructions (Signed)
Dean Larsen , Thank you for taking time to come for your Medicare Wellness Visit. I appreciate your ongoing commitment to your health goals. Please review the following plan we discussed and let me know if I can assist you in the future.   Screening recommendations/referrals: Colonoscopy: not required Recommended yearly ophthalmology/optometry visit for glaucoma screening and checkup Recommended yearly dental visit for hygiene and checkup  Vaccinations: Influenza vaccine: completed 02/23/2020, due 07/29/2021 Pneumococcal vaccine: completed 04/16/2016 Tdap vaccine: completed 08/15/2020, due 08/15/2030 Shingles vaccine: discussed   Covid-19:  04/10/2020, 03/16/2020  Advanced directives: Advance directive discussed with you today.  Conditions/risks identified: uses tobacco  Next appointment: Follow up in one year for your annual wellness visit.   Preventive Care 76 Years and Older, Male Preventive care refers to lifestyle choices and visits with your health care provider that can promote health and wellness. What does preventive care include?  A yearly physical exam. This is also called an annual well check.  Dental exams once or twice a year.  Routine eye exams. Ask your health care provider how often you should have your eyes checked.  Personal lifestyle choices, including:  Daily care of your teeth and gums.  Regular physical activity.  Eating a healthy diet.  Avoiding tobacco and drug use.  Limiting alcohol use.  Practicing safe sex.  Taking low doses of aspirin every day.  Taking vitamin and mineral supplements as recommended by your health care provider. What happens during an annual well check? The services and screenings done by your health care provider during your annual well check will depend on your age, overall health, lifestyle risk factors, and family history of disease. Counseling  Your health care provider may ask you questions about your:  Alcohol  use.  Tobacco use.  Drug use.  Emotional well-being.  Home and relationship well-being.  Sexual activity.  Eating habits.  History of falls.  Memory and ability to understand (cognition).  Work and work Statistician. Screening  You may have the following tests or measurements:  Height, weight, and BMI.  Blood pressure.  Lipid and cholesterol levels. These may be checked every 5 years, or more frequently if you are over 3 years old.  Skin check.  Lung cancer screening. You may have this screening every year starting at age 76 if you have a 30-pack-year history of smoking and currently smoke or have quit within the past 15 years.  Fecal occult blood test (FOBT) of the stool. You may have this test every year starting at age 76.  Flexible sigmoidoscopy or colonoscopy. You may have a sigmoidoscopy every 5 years or a colonoscopy every 10 years starting at age 76.  Prostate cancer screening. Recommendations will vary depending on your family history and other risks.  Hepatitis C blood test.  Hepatitis B blood test.  Sexually transmitted disease (STD) testing.  Diabetes screening. This is done by checking your blood sugar (glucose) after you have not eaten for a while (fasting). You may have this done every 1-3 years.  Abdominal aortic aneurysm (AAA) screening. You may need this if you are a current or former smoker.  Osteoporosis. You may be screened starting at age 76 if you are at high risk. Talk with your health care provider about your test results, treatment options, and if necessary, the need for more tests. Vaccines  Your health care provider may recommend certain vaccines, such as:  Influenza vaccine. This is recommended every year.  Tetanus, diphtheria, and acellular pertussis (Tdap, Td)  vaccine. You may need a Td booster every 10 years.  Zoster vaccine. You may need this after age 76.  Pneumococcal 13-valent conjugate (PCV13) vaccine. One dose is  recommended after age 76.  Pneumococcal polysaccharide (PPSV23) vaccine. One dose is recommended after age 76. Talk to your health care provider about which screenings and vaccines you need and how often you need them. This information is not intended to replace advice given to you by your health care provider. Make sure you discuss any questions you have with your health care provider. Document Released: 01/11/2016 Document Revised: 09/03/2016 Document Reviewed: 10/16/2015 Elsevier Interactive Patient Education  2017 [place] Prevention in the Home Falls can cause injuries. They can happen to people of all ages. There are many things you can do to make your home safe and to help prevent falls. What can I do on the outside of my home?  Regularly fix the edges of walkways and driveways and fix any cracks.  Remove anything that might make you trip as you walk through a door, such as a raised step or threshold.  Trim any bushes or trees on the path to your home.  Use bright outdoor lighting.  Clear any walking paths of anything that might make someone trip, such as rocks or tools.  Regularly check to see if handrails are loose or broken. Make sure that both sides of any steps have handrails.  Any raised decks and porches should have guardrails on the edges.  Have any leaves, snow, or ice cleared regularly.  Use sand or salt on walking paths during winter.  Clean up any spills in your garage right away. This includes oil or grease spills. What can I do in the bathroom?  Use night lights.  Install grab bars by the toilet and in the tub and shower. Do not use towel bars as grab bars.  Use non-skid mats or decals in the tub or shower.  If you need to sit down in the shower, use a plastic, non-slip stool.  Keep the floor dry. Clean up any water that spills on the floor as soon as it happens.  Remove soap buildup in the tub or shower regularly.  Attach bath mats  securely with double-sided non-slip rug tape.  Do not have throw rugs and other things on the floor that can make you trip. What can I do in the bedroom?  Use night lights.  Make sure that you have a light by your bed that is easy to reach.  Do not use any sheets or blankets that are too big for your bed. They should not hang down onto the floor.  Have a firm chair that has side arms. You can use this for support while you get dressed.  Do not have throw rugs and other things on the floor that can make you trip. What can I do in the kitchen?  Clean up any spills right away.  Avoid walking on wet floors.  Keep items that you use a lot in easy-to-reach places.  If you need to reach something above you, use a strong step stool that has a grab bar.  Keep electrical cords out of the way.  Do not use floor polish or wax that makes floors slippery. If you must use wax, use non-skid floor wax.  Do not have throw rugs and other things on the floor that can make you trip. What can I do with my stairs?  Do not leave  any items on the stairs.  Make sure that there are handrails on both sides of the stairs and use them. Fix handrails that are broken or loose. Make sure that handrails are as long as the stairways.  Check any carpeting to make sure that it is firmly attached to the stairs. Fix any carpet that is loose or worn.  Avoid having throw rugs at the top or bottom of the stairs. If you do have throw rugs, attach them to the floor with carpet tape.  Make sure that you have a light switch at the top of the stairs and the bottom of the stairs. If you do not have them, ask someone to add them for you. What else can I do to help prevent falls?  Wear shoes that:  Do not have high heels.  Have rubber bottoms.  Are comfortable and fit you well.  Are closed at the toe. Do not wear sandals.  If you use a stepladder:  Make sure that it is fully opened. Do not climb a closed  stepladder.  Make sure that both sides of the stepladder are locked into place.  Ask someone to hold it for you, if possible.  Clearly mark and make sure that you can see:  Any grab bars or handrails.  First and last steps.  Where the edge of each step is.  Use tools that help you move around (mobility aids) if they are needed. These include:  Canes.  Walkers.  Scooters.  Crutches.  Turn on the lights when you go into a dark area. Replace any light bulbs as soon as they burn out.  Set up your furniture so you have a clear path. Avoid moving your furniture around.  If any of your floors are uneven, fix them.  If there are any pets around you, be aware of where they are.  Review your medicines with your doctor. Some medicines can make you feel dizzy. This can increase your chance of falling. Ask your doctor what other things that you can do to help prevent falls. This information is not intended to replace advice given to you by your health care provider. Make sure you discuss any questions you have with your health care provider. Document Released: 10/11/2009 Document Revised: 05/22/2016 Document Reviewed: 01/19/2015 Elsevier Interactive Patient Education  2017 [employer].

## 2021-04-15 NOTE — Progress Notes (Signed)
I connected with Burleigh Brockmann today by telephone and verified that I am speaking with the correct person using two identifiers. Location patient: home Location provider: work Persons participating in the virtual visit: Antinio, Sanderfer LPN.   I discussed the limitations, risks, security and privacy concerns of performing an evaluation and management service by telephone and the availability of in person appointments. I also discussed with the patient that there may be a patient responsible charge related to this service. The patient expressed understanding and verbally consented to this telephonic visit.    Interactive audio and video telecommunications were attempted between this provider and patient, however failed, due to patient having technical difficulties OR patient did not have access to video capability.  We continued and completed visit with audio only.     Vital signs may be patient reported or missing.  Subjective:   Dean Larsen is a 75 y.o. male who presents for Medicare Annual/Subsequent preventive examination.  Review of Systems     Cardiac Risk Factors include: advanced age (>73men, >33 women);dyslipidemia;hypertension;male gender;sedentary lifestyle     Objective:    Today's Vitals   04/15/21 0856  Weight: 168 lb (76.2 kg)  Height: 5\' 4"  (1.626 m)   Body mass index is 28.84 kg/m.  Advanced Directives 04/15/2021 04/11/2020 06/14/2019 04/07/2018 05/23/2016  Does Patient Have a Medical Advance Directive? No No No No No  Would patient like information on creating a medical advance directive? - No - Patient declined - Yes (MAU/Ambulatory/Procedural Areas - Information given) -    Current Medications (verified) Outpatient Encounter Medications as of 04/15/2021  Medication Sig  . aspirin 81 MG tablet Take 81 mg by mouth daily.  Marland Kitchen atorvastatin (LIPITOR) 20 MG tablet Take 1 tablet by mouth once daily  . buPROPion (WELLBUTRIN XL) 300 MG 24 hr tablet Take 1  tablet (300 mg total) by mouth daily.  . Choline Fenofibrate (FENOFIBRIC ACID) 135 MG CPDR Take 1 capsule by mouth daily.  . divalproex (DEPAKOTE ER) 250 MG 24 hr tablet Take 1 tablet (250 mg total) by mouth 2 (two) times daily.  . DULoxetine (CYMBALTA) 30 MG capsule TAKE 1 CAPSULE BY MOUTH ONCE DAILY NEEDS  APPT  FOR  FURTHER  REFILLS  . losartan (COZAAR) 50 MG tablet Take 1 tablet (50 mg total) by mouth daily.  Marland Kitchen omeprazole (PRILOSEC) 40 MG capsule Take 1 capsule (40 mg total) by mouth daily.  . risperiDONE (RISPERDAL) 2 MG tablet Take 1 tablet (2 mg total) by mouth daily.  . silver sulfADIAZINE (SILVADENE) 1 % cream Apply 1 application topically daily. (Patient not taking: Reported on 04/15/2021)   No facility-administered encounter medications on file as of 04/15/2021.    Allergies (verified) Lisinopril, Codeine, Penicillin g benzathine, and Percocet [oxycodone-acetaminophen]   History: Past Medical History:  Diagnosis Date  . Arthritis   . Bipolar disorder ([place])   . CVA (cerebral vascular accident) [facility])    Age 39 - no deficits  . Diverticulitis of colon   . GERD (gastroesophageal reflux disease)   . Hyperlipidemia   . Iron deficiency anemia    Past Surgical History:  Procedure Laterality Date  . ESOPHAGOGASTRODUODENOSCOPY (EGD) WITH PROPOFOL N/A 05/23/2016   Procedure: ESOPHAGOGASTRODUODENOSCOPY (EGD) WITH PROPOFOL with dialation;  Surgeon: Lucilla Lame, MD;  Location: [facility];  Service: Endoscopy;  Laterality: N/A;  . ESOPHAGOGASTRODUODENOSCOPY (EGD) WITH PROPOFOL N/A 06/14/2019   Procedure: ESOPHAGOGASTRODUODENOSCOPY (EGD) WITH PROPOFOL;  Surgeon: Lucilla Lame, MD;  Location: [facility] ENDOSCOPY;  Service: Endoscopy;  Laterality: N/A;  . HERNIA REPAIR    . ORTHOPEDIC SURGERY     Surgery for broken leg and toe   Family History  Problem Relation Age of Onset  . Stroke Father   . Diabetes Brother    Social History   Socioeconomic History  . Marital status: Married     Spouse name: Not on file  . Number of children: Not on file  . Years of education: Not on file  . Highest education level: Not on file  Occupational History  . Occupation: retired   Tobacco Use  . Smoking status: Former Smoker    Quit date: 12/29/1978    Years since quitting: 42.3  . Smokeless tobacco: Current User    Types: Snuff  Vaping Use  . Vaping Use: Never used  Substance and Sexual Activity  . Alcohol use: No    Alcohol/week: 0.0 standard drinks  . Drug use: No  . Sexual activity: Yes    Birth control/protection: None  Other Topics Concern  . Not on file  Social History Narrative  . Not on file   Social Determinants of Health   Financial Resource Strain: Low Risk   . Difficulty of Paying Living Expenses: Not hard at all  Food Insecurity: No Food Insecurity  . Worried About Charity fundraiser in the Last Year: Never true  . Ran Out of Food in the Last Year: Never true  Transportation Needs: No Transportation Needs  . Lack of Transportation (Medical): No  . Lack of Transportation (Non-Medical): No  Physical Activity: Inactive  . Days of Exercise per Week: 0 days  . Minutes of Exercise per Session: 0 min  Stress: No Stress Concern Present  . Feeling of Stress : Not at all  Social Connections: Not on file    Tobacco Counseling Ready to quit: Not Answered Counseling given: Not Answered   Clinical Intake:  Pre-visit preparation completed: Yes  Pain : No/denies pain     Nutritional Status: BMI 25 -29 Overweight Nutritional Risks: None Diabetes: No  How often do you need to have someone help you when you read instructions, pamphlets, or other written materials from your doctor or pharmacy?: 1 - Never What is the last grade level you completed in school?: 11th grade  Diabetic? no     Information entered by :: NAllen LPN   Activities of Daily Living In your present state of health, do you have any difficulty performing the following activities:  04/15/2021  Hearing? Y  Comment a little  Vision? Y  Comment small print  Difficulty concentrating or making decisions? N  Walking or climbing stairs? N  Dressing or bathing? N  Doing errands, shopping? N  Preparing Food and eating ? N  Using the Toilet? N  In the past six months, have you accidently leaked urine? Y  Do you have problems with loss of bowel control? N  Managing your Medications? N  Managing your Finances? N  Housekeeping or managing your Housekeeping? N  Some recent data might be hidden    Patient Care Team: Valerie Roys, DO as PCP - General (Family Medicine)  Indicate any recent Medical Services you may have received from other than [facility] providers in the past year (date may be approximate).     Assessment:   This is a routine wellness examination for Dean Larsen.  Hearing/Vision screen  Hearing Screening   125Hz  250Hz  500Hz  1000Hz  2000Hz  3000Hz  4000Hz  6000Hz  8000Hz   Right  ear:           Left ear:           Vision Screening Comments: No regular eye exams, Dr. Matilde Sprang  Dietary issues and exercise activities discussed: Current Exercise Habits: The patient does not participate in regular exercise at present  Goals    . Patient Stated     04/15/2021, no goals    . Quit Smoking     Tobacco cessation discussed      Depression Screen PHQ 2/9 Scores 04/15/2021 04/11/2020 02/23/2020 07/06/2019 06/01/2018 04/07/2018 04/20/2017  PHQ - 2 Score 0 0 0 0 0 0 0  PHQ- 9 Score - - 0 0 - - -    Fall Risk Fall Risk  04/15/2021 04/11/2020 02/23/2020 07/06/2019 06/01/2018  Falls in the past year? 1 0 0 0 No  Comment lost balance - - - -  Number falls in past yr: 0 0 0 0 -  Injury with Fall? 1 0 0 0 -  Comment bruises - - - -  Risk for fall due to : Medication side effect - - - -  Follow up Falls evaluation completed;Education provided;Falls prevention discussed - - - -    FALL RISK PREVENTION PERTAINING TO THE HOME:  Any stairs in or around the home? Yes  If so, are there any  without handrails? Yes  Home free of loose throw rugs in walkways, pet beds, electrical cords, etc? Yes  Adequate lighting in your home to reduce risk of falls? Yes   ASSISTIVE DEVICES UTILIZED TO PREVENT FALLS:  Life alert? No  Use of a cane, walker or w/c? No  Grab bars in the bathroom? Yes  Shower chair or bench in shower? No  Elevated toilet seat or a handicapped toilet? No   TIMED UP AND GO:  Was the test performed? No   Cognitive Function:     6CIT Screen 04/15/2021 04/07/2018  What Year? 0 points 0 points  What month? 0 points 0 points  What time? 0 points 0 points  Count back from 20 0 points 0 points  Months in reverse 4 points 0 points  Repeat phrase 6 points 10 points  Total Score 10 10    Immunizations Immunization History  Administered Date(s) Administered  . Fluad Quad(high Dose 65+) 02/23/2020  . Influenza, High Dose Seasonal PF 10/16/2016, 12/07/2018  . Influenza,inj,Quad PF,6+ Mos 10/02/2014, 09/27/2015  . Influenza-Unspecified 01/16/2018  . PFIZER(Purple Top)SARS-COV-2 Vaccination 03/16/2020, 04/10/2020  . Pneumococcal Conjugate-13 03/15/2015  . Pneumococcal Polysaccharide-23 04/16/2016  . Td 08/15/2020  . Tdap 10/04/2008    TDAP status: Up to date  Flu Vaccine status: Up to date  Pneumococcal vaccine status: Up to date  Covid-19 vaccine status: Completed vaccines  Qualifies for Shingles Vaccine? Yes   Zostavax completed No   Shingrix Completed?: No.    Education has been provided regarding the importance of this vaccine. Patient has been advised to call insurance company to determine out of pocket expense if they have not yet received this vaccine. Advised may also receive vaccine at local pharmacy or Health Dept. Verbalized acceptance and understanding.  Screening Tests Health Maintenance  Topic Date Due  . COVID-19 Vaccine (3 - Booster for Pfizer series) 10/10/2020  . INFLUENZA VACCINE  07/29/2021  . COLONOSCOPY (Pts 45-8yrs Insurance  coverage will need to be confirmed)  06/09/2022  . TETANUS/TDAP  08/15/2030  . Hepatitis C Screening  Completed  . PNA vac Low Risk Adult  Completed  .  HPV VACCINES  Aged Out    Health Maintenance  Health Maintenance Due  Topic Date Due  . COVID-19 Vaccine (3 - Booster for Pfizer series) 10/10/2020    Colorectal cancer screening: No longer required.   Lung Cancer Screening: (Low Dose CT Chest recommended if Age 69-80 years, 30 pack-year currently smoking OR have quit w/in 15years.) does not qualify.   Lung Cancer Screening Referral: no  Additional Screening:  Hepatitis C Screening: does qualify; Completed 04/16/2016  Vision Screening: Recommended annual ophthalmology exams for early detection of glaucoma and other disorders of the eye. Is the patient up to date with their annual eye exam?  No  Who is the provider or what is the name of the office in which the patient attends annual eye exams? Dr. Matilde Sprang If pt is not established with a provider, would they like to be referred to a provider to establish care? No .   Dental Screening: Recommended annual dental exams for proper oral hygiene  Community Resource Referral / Chronic Care Management: CRR required this visit?  No   CCM required this visit?  No      Plan:     I have personally reviewed and noted the following in the patient's chart:   . Medical and social history . Use of alcohol, tobacco or illicit drugs  . Current medications and supplements . Functional ability and status . Nutritional status . Physical activity . Advanced directives . List of other physicians . Hospitalizations, surgeries, and ER visits in previous 12 months . Vitals . Screenings to include cognitive, depression, and falls . Referrals and appointments  In addition, I have reviewed and discussed with patient certain preventive protocols, quality metrics, and best practice recommendations. A written personalized care plan for preventive  services as well as general preventive health recommendations were provided to patient.     Kellie Simmering, LPN   [id number]   Nurse Notes:

## 2021-04-16 ENCOUNTER — Other Ambulatory Visit: Payer: Self-pay | Admitting: *Deleted

## 2021-04-16 ENCOUNTER — Ambulatory Visit: Payer: Medicare Other | Admitting: Family Medicine

## 2021-04-16 DIAGNOSIS — R972 Elevated prostate specific antigen [PSA]: Secondary | ICD-10-CM

## 2021-04-23 ENCOUNTER — Ambulatory Visit (INDEPENDENT_AMBULATORY_CARE_PROVIDER_SITE_OTHER): Payer: Medicare Other | Admitting: Family Medicine

## 2021-04-23 ENCOUNTER — Other Ambulatory Visit: Payer: Self-pay

## 2021-04-23 ENCOUNTER — Encounter: Payer: Self-pay | Admitting: Family Medicine

## 2021-04-23 VITALS — BP 155/77 | HR 87 | Temp 97.9°F | Wt 171.2 lb

## 2021-04-23 DIAGNOSIS — N183 Chronic kidney disease, stage 3 unspecified: Secondary | ICD-10-CM | POA: Diagnosis not present

## 2021-04-23 DIAGNOSIS — I10 Essential (primary) hypertension: Secondary | ICD-10-CM

## 2021-04-23 DIAGNOSIS — E7849 Other hyperlipidemia: Secondary | ICD-10-CM | POA: Diagnosis not present

## 2021-04-23 DIAGNOSIS — M79671 Pain in right foot: Secondary | ICD-10-CM

## 2021-04-23 DIAGNOSIS — I700 Atherosclerosis of aorta: Secondary | ICD-10-CM | POA: Diagnosis not present

## 2021-04-23 DIAGNOSIS — N400 Enlarged prostate without lower urinary tract symptoms: Secondary | ICD-10-CM | POA: Diagnosis not present

## 2021-04-23 DIAGNOSIS — I129 Hypertensive chronic kidney disease with stage 1 through stage 4 chronic kidney disease, or unspecified chronic kidney disease: Secondary | ICD-10-CM | POA: Diagnosis not present

## 2021-04-23 DIAGNOSIS — F3170 Bipolar disorder, currently in remission, most recent episode unspecified: Secondary | ICD-10-CM

## 2021-04-23 MED ORDER — BUPROPION HCL ER (XL) 300 MG PO TB24: 300.0000 mg | ORAL_TABLET | Freq: Every day | ORAL | 1 refills | Status: DC

## 2021-04-23 MED ORDER — RISPERIDONE 2 MG PO TABS: 2.0000 mg | ORAL_TABLET | Freq: Every day | ORAL | 1 refills | Status: DC

## 2021-04-23 MED ORDER — DIVALPROEX SODIUM ER 250 MG PO TB24: 250.0000 mg | ORAL_TABLET | Freq: Two times a day (BID) | ORAL | 1 refills | Status: DC

## 2021-04-23 MED ORDER — DULOXETINE HCL 60 MG PO CPEP: 60.0000 mg | ORAL_CAPSULE | Freq: Every day | ORAL | 2 refills | Status: DC

## 2021-04-23 MED ORDER — OMEPRAZOLE 40 MG PO CPDR: 40.0000 mg | DELAYED_RELEASE_CAPSULE | Freq: Every day | ORAL | 5 refills | Status: DC

## 2021-04-23 MED ORDER — ATORVASTATIN CALCIUM 20 MG PO TABS: 1.0000 | ORAL_TABLET | Freq: Every day | ORAL | 1 refills | Status: DC

## 2021-04-23 NOTE — Progress Notes (Signed)
BP (!) 155/77   Pulse 87   Temp 97.9 F (36.6 C)   Wt 171 lb 3.2 oz (77.7 kg)   SpO2 97%   BMI 29.39 kg/m    Subjective:    Patient ID: Dean Larsen, male    DOB: February 24, 1946, 75 y.o.   MRN: [id number]  HPI: Dean Larsen is a 75 y.o. male  Chief Complaint  Patient presents with  . Hypertension  . Hyperlipidemia  . Chronic Kidney Disease  . Foot Injury    Patient states about 5 weeks ago he fell and hurt his right foot. Patient states its swollen and hurts.    HYPERTENSION / HYPERLIPIDEMIA- didn't take his blood pressure medicine this AM, has been running well at home.  Satisfied with current treatment? yes Duration of hypertension: chronic BP monitoring frequency: a few times a week BP range: 120s-130s BP medication side effects: no Past BP meds: losartan Duration of hyperlipidemia: chronic Cholesterol medication side effects: no Cholesterol supplements: none Past cholesterol medications: atorvastatin Medication compliance: excellent compliance Aspirin: yes Recent stressors: no Recurrent headaches: no Visual changes: no Palpitations: no Dyspnea: no Chest pain: no Lower extremity edema: no Dizzy/lightheaded: no  DEPRESSION Mood status: uncontrolled Satisfied with current treatment?: no Symptom severity: moderate  Duration of current treatment : chronic Side effects: no Medication compliance: excellent compliance Psychotherapy/counseling: no  Previous psychiatric medications: depakote, risperidone, cymbalta Depressed mood: yes Anxious mood: no Anhedonia: no Significant weight loss or gain: no Insomnia: no  Fatigue: yes Feelings of worthlessness or guilt: no Impaired concentration/indecisiveness: no Suicidal ideations: no Hopelessness: no Crying spells: no Depression screen [facility] 2/9 04/15/2021 04/11/2020 02/23/2020 07/06/2019 06/01/2018  Decreased Interest 0 0 0 0 0  Down, Depressed, Hopeless 0 0 0 0 0  PHQ - 2 Score 0 0 0 0 0  Altered sleeping - - 0  0 -  Tired, decreased energy - - 0 0 -  Change in appetite - - 0 0 -  Feeling bad or failure about yourself  - - 0 0 -  Trouble concentrating - - 0 0 -  Moving slowly or fidgety/restless - - 0 0 -  Suicidal thoughts - - 0 0 -  PHQ-9 Score - - 0 0 -   FOOT PAIN Duration: 5 weeks Involved foot: right Mechanism of injury: fell Location: top of his foot and R great toe Onset: sudden  Severity: severe  Quality:  Aching and sore Frequency: constant Radiation: no Aggravating factors: weight bearing, walking, running, bending and movement  Alleviating factors: nothing   Status: stable Treatments attempted: rest, ice, heat, APAP, ibuprofen and aleve  Relief with NSAIDs?:  mild Weakness with weight bearing or walking: no Morning stiffness: yes Swelling: yes Redness: yes Bruising: yes Paresthesias / decreased sensation: no  Fevers:no   Relevant past medical, surgical, family and social history reviewed and updated as indicated. Interim medical history since our last visit reviewed. Allergies and medications reviewed and updated.  Review of Systems  Constitutional: Negative.   Respiratory: Negative.   Cardiovascular: Negative.   Gastrointestinal: Negative.   Musculoskeletal: Negative.   Neurological: Negative.   Psychiatric/Behavioral: Positive for dysphoric mood. Negative for agitation, behavioral problems, confusion, decreased concentration, hallucinations, self-injury, sleep disturbance and suicidal ideas. The patient is nervous/anxious. The patient is not hyperactive.     Per HPI unless specifically indicated above     Objective:    BP (!) 155/77   Pulse 87   Temp 97.9 F (36.6 C)  Wt 171 lb 3.2 oz (77.7 kg)   SpO2 97%   BMI 29.39 kg/m   Wt Readings from Last 3 Encounters:  04/23/21 171 lb 3.2 oz (77.7 kg)  04/15/21 168 lb (76.2 kg)  11/29/20 164 lb (74.4 kg)    Physical Exam Vitals and nursing note reviewed.  Constitutional:      General: He is not in  acute distress.    Appearance: Normal appearance. He is not ill-appearing, toxic-appearing or diaphoretic.  HENT:     Head: Normocephalic and atraumatic.     Right Ear: External ear normal.     Left Ear: External ear normal.     Nose: Nose normal.     Mouth/Throat:     Mouth: Mucous membranes are moist.     Pharynx: Oropharynx is clear.  Eyes:     General: No scleral icterus.       Right eye: No discharge.        Left eye: No discharge.     Extraocular Movements: Extraocular movements intact.     Conjunctiva/sclera: Conjunctivae normal.     Pupils: Pupils are equal, round, and reactive to light.  Cardiovascular:     Rate and Rhythm: Normal rate and regular rhythm.     Pulses: Normal pulses.     Heart sounds: Normal heart sounds. No murmur heard. No friction rub. No gallop.   Pulmonary:     Effort: Pulmonary effort is normal. No respiratory distress.     Breath sounds: Normal breath sounds. No stridor. No wheezing, rhonchi or rales.  Chest:     Chest wall: No tenderness.  Musculoskeletal:        General: Normal range of motion.     Cervical back: Normal range of motion and neck supple.     Comments: Significant swelling and tenderness over 1st MTP on the R foot  Skin:    General: Skin is warm and dry.     Capillary Refill: Capillary refill takes less than 2 seconds.     Coloration: Skin is not jaundiced or pale.     Findings: No bruising, erythema, lesion or rash.  Neurological:     General: No focal deficit present.     Mental Status: He is alert and oriented to person, place, and time. Mental status is at baseline.  Psychiatric:        Mood and Affect: Mood normal.        Behavior: Behavior normal.        Thought Content: Thought content normal.        Judgment: Judgment normal.     Results for orders placed or performed in visit on 01/09/21  Valproic Acid level  Result Value Ref Range   Valproic Acid Lvl <4 (L) 50 - 100 ug/mL      Assessment & Plan:   Problem  List Items Addressed This Visit      Cardiovascular and Mediastinum   Essential hypertension - Primary    Did not take his medicine today. Running well at home. Continue current regimen. Continue to monitor. Call with any concerns. Refills given. Labs drawn today.       Relevant Medications   atorvastatin (LIPITOR) 20 MG tablet   Other Relevant Orders   Comprehensive metabolic panel   Aortic atherosclerosis (HCC)    Will keep BP and cholesterol under good control. Continue to monitor. Call with any concerns.       Relevant Medications   atorvastatin (LIPITOR) 20 MG  tablet     Genitourinary   Hypertensive kidney disease with CKD stage III [facility])    Did not take his medicine today. Running well at home. Continue current regimen. Continue to monitor. Call with any concerns. Refills given. Labs drawn today.       Relevant Orders   Comprehensive metabolic panel   BPH (benign prostatic hyperplasia)    Under good control on current regimen. Continue current regimen. Continue to monitor. Call with any concerns. Refills given. Labs drawn today.       Relevant Orders   Comprehensive metabolic panel   PSA     Other   Bipolar disorder ([place])    Not under good control. Will increase his cymbalta to 60mg  and recheck 1 month. Call with any concerns. Continue to monitor.       Relevant Medications   divalproex (DEPAKOTE ER) 250 MG 24 hr tablet   DULoxetine (CYMBALTA) 60 MG capsule   risperiDONE (RISPERDAL) 2 MG tablet   buPROPion (WELLBUTRIN XL) 300 MG 24 hr tablet   Other Relevant Orders   Valproic Acid level   Hyperlipidemia    Under good control on current regimen. Continue current regimen. Continue to monitor. Call with any concerns. Refills given. Labs drawn today.       Relevant Medications   atorvastatin (LIPITOR) 20 MG tablet   Other Relevant Orders   Comprehensive metabolic panel   Lipid Panel w/o Chol/HDL Ratio    Other Visit Diagnoses    Right foot pain        Concern for fracture- [facility] Call with any concerns.        Follow up plan: Return in about 4 weeks (around 05/21/2021).

## 2021-04-23 NOTE — Assessment & Plan Note (Signed)
Under good control on current regimen. Continue current regimen. Continue to monitor. Call with any concerns. Refills given. Labs drawn today.   

## 2021-04-23 NOTE — Assessment & Plan Note (Addendum)
Did not take his medicine today. Running well at home. Continue current regimen. Continue to monitor. Call with any concerns. Refills given. Labs drawn today.  

## 2021-04-23 NOTE — Assessment & Plan Note (Signed)
Did not take his medicine today. Running well at home. Continue current regimen. Continue to monitor. Call with any concerns. Refills given. Labs drawn today.

## 2021-04-23 NOTE — Assessment & Plan Note (Signed)
Will keep BP and cholesterol under good control. Continue to monitor. Call with any concerns.  

## 2021-04-23 NOTE — Assessment & Plan Note (Signed)
Not under good control. Will increase his cymbalta to 60mg  and recheck 1 month. Call with any concerns. Continue to monitor.

## 2021-04-24 LAB — LIPID PANEL W/O CHOL/HDL RATIO
Cholesterol, Total: 167 mg/dL (ref 100–199)
HDL: 42 mg/dL (ref >39)
LDL Chol Calc (NIH): 103 mg/dL — ABNORMAL HIGH (ref 0–99)
Triglycerides: 121 mg/dL (ref 0–149)
VLDL Cholesterol Cal: 22 mg/dL (ref 5–40)

## 2021-04-24 LAB — COMPREHENSIVE METABOLIC PANEL
ALT: 39 IU/L (ref 0–44)
AST: 37 IU/L (ref 0–40)
Albumin/Globulin Ratio: 1.6 (ref 1.2–2.2)
Albumin: 4.4 g/dL (ref 3.7–4.7)
Alkaline Phosphatase: 54 IU/L (ref 44–121)
BUN/Creatinine Ratio: 13 (ref 10–24)
BUN: 19 mg/dL (ref 8–27)
Bilirubin Total: 1.2 mg/dL (ref 0.0–1.2)
CO2: 21 mmol/L (ref 20–29)
Calcium: 9.7 mg/dL (ref 8.6–10.2)
Chloride: 102 mmol/L (ref 96–106)
Creatinine, Ser: 1.43 mg/dL — ABNORMAL HIGH (ref 0.76–1.27)
Globulin, Total: 2.7 g/dL (ref 1.5–4.5)
Glucose: 92 mg/dL (ref 65–99)
Potassium: 4.5 mmol/L (ref 3.5–5.2)
Sodium: 142 mmol/L (ref 134–144)
Total Protein: 7.1 g/dL (ref 6.0–8.5)
eGFR: 51 mL/min/{1.73_m2} — ABNORMAL LOW (ref >59)

## 2021-04-24 LAB — VALPROIC ACID LEVEL: Valproic Acid Lvl: <4 ug/mL — ABNORMAL LOW (ref 50–100)

## 2021-04-24 LAB — PSA: Prostate Specific Ag, Serum: 15.3 ng/mL — ABNORMAL HIGH (ref 0.0–4.0)

## 2021-04-25 ENCOUNTER — Other Ambulatory Visit: Payer: Self-pay | Admitting: Family Medicine

## 2021-04-25 DIAGNOSIS — M19071 Primary osteoarthritis, right ankle and foot: Secondary | ICD-10-CM | POA: Diagnosis not present

## 2021-04-25 DIAGNOSIS — R972 Elevated prostate specific antigen [PSA]: Secondary | ICD-10-CM

## 2021-04-25 DIAGNOSIS — S93601A Unspecified sprain of right foot, initial encounter: Secondary | ICD-10-CM | POA: Diagnosis not present

## 2021-04-26 DIAGNOSIS — S93601A Unspecified sprain of right foot, initial encounter: Secondary | ICD-10-CM | POA: Diagnosis not present

## 2021-04-26 DIAGNOSIS — M19079 Primary osteoarthritis, unspecified ankle and foot: Secondary | ICD-10-CM | POA: Diagnosis not present

## 2021-05-20 NOTE — Progress Notes (Signed)
BP (!) 172/74   Pulse 71   Temp 98.2 F (36.8 C)   Wt 178 lb 12.8 oz (81.1 kg)   SpO2 98%   BMI 30.69 kg/m    Subjective:    Patient ID: Dean Larsen, male    DOB: 07-Jan-1946, 75 y.o.   MRN: [id number]  HPI: Dean Larsen is a 75 y.o. male  Chief Complaint  Patient presents with  . Hypertension  . mood   DEPRESSION Mood status: better Satisfied with current treatment?: yes Symptom severity: mild  Duration of current treatment : chronic Side effects: no Medication compliance: excellent compliance Psychotherapy/counseling: no  Depressed mood: no Anxious mood: no Anhedonia: no Significant weight loss or gain: no Insomnia: no  Fatigue: no Feelings of worthlessness or guilt: no Impaired concentration/indecisiveness: no Suicidal ideations: no Hopelessness: no Crying spells: no Depression screen [facility] 2/9 05/21/2021 04/23/2021 04/15/2021 04/11/2020 02/23/2020  Decreased Interest 0 0 0 0 0  Down, Depressed, Hopeless 0 0 0 0 0  PHQ - 2 Score 0 0 0 0 0  Altered sleeping 0 0 - - 0  Tired, decreased energy 0 0 - - 0  Change in appetite 0 0 - - 0  Feeling bad or failure about yourself  0 0 - - 0  Trouble concentrating 0 0 - - 0  Moving slowly or fidgety/restless 0 0 - - 0  Suicidal thoughts 0 0 - - 0  PHQ-9 Score 0 0 - - 0  Difficult doing work/chores Not difficult at all Not difficult at all - - -    HYPERTENSION Hypertension status: uncontrolled  Satisfied with current treatment? no Duration of hypertension: chronic BP monitoring frequency:  not checking BP medication side effects:  no Medication compliance: excellent compliance Previous BP meds: losartan Aspirin: no Recurrent headaches: no Visual changes: no Palpitations: no Dyspnea: no Chest pain: no Lower extremity edema: no Dizzy/lightheaded: no   Relevant past medical, surgical, family and social history reviewed and updated as indicated. Interim medical history since our last visit  reviewed. Allergies and medications reviewed and updated.  Review of Systems  Constitutional: Negative.   Respiratory: Negative.   Cardiovascular: Negative.   Gastrointestinal: Negative.   Musculoskeletal: Negative.   Skin: Negative.   Psychiatric/Behavioral: Negative.     Per HPI unless specifically indicated above     Objective:    BP (!) 172/74   Pulse 71   Temp 98.2 F (36.8 C)   Wt 178 lb 12.8 oz (81.1 kg)   SpO2 98%   BMI 30.69 kg/m   Wt Readings from Last 3 Encounters:  05/21/21 178 lb 12.8 oz (81.1 kg)  04/23/21 171 lb 3.2 oz (77.7 kg)  04/15/21 168 lb (76.2 kg)    Physical Exam Vitals and nursing note reviewed.  Constitutional:      General: He is not in acute distress.    Appearance: Normal appearance. He is not ill-appearing, toxic-appearing or diaphoretic.  HENT:     Head: Normocephalic and atraumatic.     Right Ear: External ear normal.     Left Ear: External ear normal.     Nose: Nose normal.     Mouth/Throat:     Mouth: Mucous membranes are moist.     Pharynx: Oropharynx is clear.  Eyes:     General: No scleral icterus.       Right eye: No discharge.        Left eye: No discharge.     Extraocular  Movements: Extraocular movements intact.     Conjunctiva/sclera: Conjunctivae normal.     Pupils: Pupils are equal, round, and reactive to light.  Cardiovascular:     Rate and Rhythm: Normal rate and regular rhythm.     Pulses: Normal pulses.     Heart sounds: Normal heart sounds. No murmur heard. No friction rub. No gallop.   Pulmonary:     Effort: Pulmonary effort is normal. No respiratory distress.     Breath sounds: Normal breath sounds. No stridor. No wheezing, rhonchi or rales.  Chest:     Chest wall: No tenderness.  Musculoskeletal:        General: Normal range of motion.     Cervical back: Normal range of motion and neck supple.  Skin:    General: Skin is warm and dry.     Capillary Refill: Capillary refill takes less than 2 seconds.      Coloration: Skin is not jaundiced or pale.     Findings: No bruising, erythema, lesion or rash.  Neurological:     General: No focal deficit present.     Mental Status: He is alert and oriented to person, place, and time. Mental status is at baseline.  Psychiatric:        Mood and Affect: Mood normal.        Behavior: Behavior normal.        Thought Content: Thought content normal.        Judgment: Judgment normal.     Results for orders placed or performed in visit on 04/23/21  Comprehensive metabolic panel  Result Value Ref Range   Glucose 92 65 - 99 mg/dL   BUN 19 8 - 27 mg/dL   Creatinine, Ser 1.43 (H) 0.76 - 1.27 mg/dL   eGFR 51 (L) >59 mL/min/1.73   BUN/Creatinine Ratio 13 10 - 24   Sodium 142 134 - 144 mmol/L   Potassium 4.5 3.5 - 5.2 mmol/L   Chloride 102 96 - 106 mmol/L   CO2 21 20 - 29 mmol/L   Calcium 9.7 8.6 - 10.2 mg/dL   Total Protein 7.1 6.0 - 8.5 g/dL   Albumin 4.4 3.7 - 4.7 g/dL   Globulin, Total 2.7 1.5 - 4.5 g/dL   Albumin/Globulin Ratio 1.6 1.2 - 2.2   Bilirubin Total 1.2 0.0 - 1.2 mg/dL   Alkaline Phosphatase 54 44 - 121 IU/L   AST 37 0 - 40 IU/L   ALT 39 0 - 44 IU/L  Lipid Panel w/o Chol/HDL Ratio  Result Value Ref Range   Cholesterol, Total 167 100 - 199 mg/dL   Triglycerides 121 0 - 149 mg/dL   HDL 42 >39 mg/dL   VLDL Cholesterol Cal 22 5 - 40 mg/dL   LDL Chol Calc (NIH) 103 (H) 0 - 99 mg/dL  PSA  Result Value Ref Range   Prostate Specific Ag, Serum 15.3 (H) 0.0 - 4.0 ng/mL  Valproic Acid level  Result Value Ref Range   Valproic Acid Lvl <4 (L) 50 - 100 ug/mL      Assessment & Plan:   Problem List Items Addressed This Visit      Cardiovascular and Mediastinum   Essential hypertension - Primary    Not under good control. Will increase his losartan to $RemoveBef'75mg'[id number]$  and recheck 1 month.       Relevant Medications   losartan (COZAAR) 50 MG tablet     Other   Bipolar disorder ([place])    Under good control  on current regimen. Continue current  regimen. Continue to monitor. Call with any concerns. Refills given.            Follow up plan: Return in about 4 weeks (around 06/18/2021).

## 2021-05-21 ENCOUNTER — Other Ambulatory Visit: Payer: Self-pay

## 2021-05-21 ENCOUNTER — Encounter: Payer: Self-pay | Admitting: Family Medicine

## 2021-05-21 ENCOUNTER — Ambulatory Visit (INDEPENDENT_AMBULATORY_CARE_PROVIDER_SITE_OTHER): Payer: Medicare Other | Admitting: Family Medicine

## 2021-05-21 VITALS — BP 172/74 | HR 71 | Temp 98.2°F | Wt 178.8 lb

## 2021-05-21 DIAGNOSIS — I10 Essential (primary) hypertension: Secondary | ICD-10-CM | POA: Diagnosis not present

## 2021-05-21 DIAGNOSIS — F3170 Bipolar disorder, currently in remission, most recent episode unspecified: Secondary | ICD-10-CM | POA: Diagnosis not present

## 2021-05-21 MED ORDER — LOSARTAN POTASSIUM 50 MG PO TABS: 75.0000 mg | ORAL_TABLET | Freq: Every day | ORAL | 0 refills | Status: DC

## 2021-05-21 NOTE — Assessment & Plan Note (Signed)
Not under good control. Will increase his losartan to 75mg  and recheck 1 month.

## 2021-05-21 NOTE — Assessment & Plan Note (Signed)
Under good control on current regimen. Continue current regimen. Continue to monitor. Call with any concerns. Refills given.   

## 2021-05-28 ENCOUNTER — Other Ambulatory Visit: Payer: Medicare Other

## 2021-05-28 ENCOUNTER — Other Ambulatory Visit: Payer: Self-pay

## 2021-05-28 DIAGNOSIS — R972 Elevated prostate specific antigen [PSA]: Secondary | ICD-10-CM

## 2021-05-29 LAB — PSA: Prostate Specific Ag, Serum: 14.3 ng/mL — ABNORMAL HIGH (ref 0.0–4.0)

## 2021-05-30 ENCOUNTER — Ambulatory Visit: Payer: Medicare Other | Admitting: Urology

## 2021-05-30 ENCOUNTER — Encounter: Payer: Self-pay | Admitting: Urology

## 2021-05-30 ENCOUNTER — Other Ambulatory Visit: Payer: Self-pay

## 2021-05-30 VITALS — BP 182/91 | HR 91 | Ht 64.0 in | Wt 179.2 lb

## 2021-05-30 DIAGNOSIS — R972 Elevated prostate specific antigen [PSA]: Secondary | ICD-10-CM

## 2021-05-30 DIAGNOSIS — N3281 Overactive bladder: Secondary | ICD-10-CM

## 2021-05-30 NOTE — Progress Notes (Signed)
   05/30/2021 9:23 AM   Dean Larsen [birth year] [id number]  Reason for visit: Follow up elevated PSA  HPI: 75 year old male with medical history notable for bipolar disorder and stroke who I saw in September 2021 for a persistently rising PSA.  PSA was 4.1 in April 2017, 7 in April 2018, 10.7 in February 2021, and 14.4 in August 2021.  DRE has been benign.  We had previously discussed extensively the risks and benefits of biopsy, and he and his daughter deferred biopsy.  They understand the risks of missing a clinically significant prostate cancer, and we reviewed the AUA guidelines regarding the recommendation against routine screening in men over age 18.  Most recent PSA on 05/28/2021 was 14.3 which is stable from 14.4 in August 2021.  I recommended a urinalysis today to rule out any UTI in the setting of his urinary frequency, however this is most likely secondary to his high intake of diet sodas and tea.  If he had a UTI, this certainly could be the explanation for his elevated PSA, and would recommend treating with antibiotics and rechecking the PSA in a few months.  We again reviewed the risk of deferring biopsy and developing metastatic disease with inability to cure, and the risks of biopsy including bleeding, infection, pain, as well as side effects from treatment for possible prostate cancer were again discussed extensively.  He is very opposed to undergoing a biopsy.  They were amenable to repeating a PSA in 6 months and continuing to monitor, with the understanding that if the PSA continues to rise greater than 20 this likely represents a prostate cancer and I would again recommend biopsy.  Urinalysis and culture today, call with results Repeat PSA 6 months   [name], [place] [address] [place], [place] [address] [phone]

## 2021-05-30 NOTE — Patient Instructions (Signed)
Prostate Cancer Screening  Prostate cancer screening is a test that is done to check for the presence of prostate cancer in men. The prostate gland is a walnut-sized gland that is located below the bladder and in front of the rectum in males. The function of the prostate is to add fluid to semen during ejaculation. Prostate cancer is the second most common type of cancer in men. Who should have prostate cancer screening?  Screening recommendations vary based on age and other risk factors. Screening is recommended if:  You are older than age 55. If you are age 55-69, talk with your health care provider about your need for screening and how often screening should be done. Because most prostate cancers are slow growing and will not cause death, screening is generally reserved in this age group for men who have a 10-15-year life expectancy.  You are younger than age 55, and you have these risk factors: ? Being a black male or a male of African descent. ? Having a father, brother, or uncle who has been diagnosed with prostate cancer. The risk is higher if your family member's cancer occurred at an early age. Screening is not recommended if:  You are younger than age 40.  You are between the ages of 40 and 54 and you have no risk factors.  You are 70 years of age or older. At this age, the risks that screening can cause are greater than the benefits that it may provide. If you are at high risk for prostate cancer, your health care provider may recommend that you have screenings more often or that you start screening at a younger age. How is screening for prostate cancer done? The recommended prostate cancer screening test is a blood test called the prostate-specific antigen (PSA) test. PSA is a protein that is made in the prostate. As you age, your prostate naturally produces more PSA. Abnormally high PSA levels may be caused by:  Prostate cancer.  An enlarged prostate that is not caused by cancer  (benign prostatic hyperplasia, BPH). This condition is very common in older men.  A prostate gland infection (prostatitis). Depending on the PSA results, you may need more tests, such as:  A physical exam to check the size of your prostate gland.  Blood and imaging tests.  A procedure to remove tissue samples from your prostate gland for testing (biopsy). What are the benefits of prostate cancer screening?  Screening can help to identify cancer at an early stage, before symptoms start and when the cancer can be treated more easily.  There is a small chance that screening may lower your risk of dying from prostate cancer. The chance is small because prostate cancer is a slow-growing cancer, and most men with prostate cancer die from a different cause. What are the risks of prostate cancer screening? The main risk of prostate cancer screening is diagnosing and treating prostate cancer that would never have caused any symptoms or problems. This is called overdiagnosisand overtreatment. PSA screening cannot tell you if your PSA is high due to cancer or a different cause. A prostate biopsy is the only procedure to diagnose prostate cancer. Even the results of a biopsy may not tell you if your cancer needs to be treated. Slow-growing prostate cancer may not need any treatment other than monitoring, so diagnosing and treating it may cause unnecessary stress or other side effects. A prostate biopsy may also cause:  Infection or fever.  A false negative. This is   a result that shows that you do not have prostate cancer when you actually do have prostate cancer. Questions to ask your health care provider  When should I start prostate cancer screening?  What is my risk for prostate cancer?  How often do I need screening?  What type of screening tests do I need?  How do I get my test results?  What do my results mean?  Do I need treatment? Where to find more information  The American Cancer  Society: www.cancer.org  American Urological Association: www.auanet.org Contact a health care provider if:  You have difficulty urinating.  You have pain when you urinate or ejaculate.  You have blood in your urine or semen.  You have pain in your back or in the area of your prostate. Summary  Prostate cancer is a common type of cancer in men. The prostate gland is located below the bladder and in front of the rectum. This gland adds fluid to semen during ejaculation.  Prostate cancer screening may identify cancer at an early stage, when the cancer can be treated more easily.  The prostate-specific antigen (PSA) test is the recommended screening test for prostate cancer.  Discuss the risks and benefits of prostate cancer screening with your health care provider. If you are age 22 or older, the risks that screening can cause are greater than the benefits that it may provide. This information is not intended to replace advice given to you by your health care provider. Make sure you discuss any questions you have with your health care provider. Document Revised: 04/06/2020 Document Reviewed: 07/28/2019 Elsevier Patient Education  [place].

## 2021-05-31 LAB — URINALYSIS, COMPLETE
Bilirubin, UA: NEGATIVE
Glucose, UA: NEGATIVE
Ketones, UA: NEGATIVE
Leukocytes,UA: NEGATIVE
Nitrite, UA: NEGATIVE
Protein,UA: NEGATIVE
Specific Gravity, UA: 1.02 (ref 1.005–1.030)
Urobilinogen, Ur: 1 mg/dL (ref 0.2–1.0)
pH, UA: 6.5 (ref 5.0–7.5)

## 2021-05-31 LAB — MICROSCOPIC EXAMINATION: Bacteria, UA: NONE SEEN

## 2021-06-03 LAB — CULTURE, URINE COMPREHENSIVE

## 2021-06-18 ENCOUNTER — Ambulatory Visit: Payer: Medicare Other | Admitting: Family Medicine

## 2021-08-05 ENCOUNTER — Other Ambulatory Visit: Payer: Self-pay | Admitting: Family Medicine

## 2021-08-05 DIAGNOSIS — F3170 Bipolar disorder, currently in remission, most recent episode unspecified: Secondary | ICD-10-CM

## 2021-08-05 NOTE — Telephone Encounter (Signed)
Overdue for appointment 

## 2021-08-05 NOTE — Telephone Encounter (Signed)
Requested medication (s) are due for refill today: yes  Requested medication (s) are on the active medication list: yes   Last refill: 06/25/2021  Future visit scheduled: no  Notes to clinic: Patient was due for follow up on 06/18/2021 Review for refill    Requested Prescriptions  Pending Prescriptions Disp Refills   DULoxetine (CYMBALTA) 60 MG capsule [Pharmacy Med Name: DULoxetine HCl 60 MG Oral Capsule Delayed Release Particles] 30 capsule 0    Sig: Take 1 capsule by mouth once daily      Psychiatry: Antidepressants - SNRI Failed - 08/05/2021 10:38 AM      Failed - Last BP in normal range    BP Readings from Last 1 Encounters:  05/30/21 (!) 182/91          Passed - Valid encounter within last 6 months    Recent Outpatient Visits           2 months ago Essential hypertension   [place], Megan P, DO   3 months ago Essential hypertension   [place], Megan P, DO   9 months ago Aortic atherosclerosis ([place])   [place], Jolene T, NP   11 months ago Hypertensive kidney disease with stage 3 chronic kidney disease, unspecified whether stage 3a or 3b CKD   [facility], [place]   11 months ago [place], Lilia Argue, [place]       Future Appointments             In 8 months [employer], [facility]

## 2021-08-06 NOTE — Telephone Encounter (Signed)
Appointment scheduled.

## 2021-08-07 ENCOUNTER — Emergency Department: Payer: Medicare Other

## 2021-08-07 ENCOUNTER — Other Ambulatory Visit: Payer: Self-pay

## 2021-08-07 ENCOUNTER — Observation Stay
Admission: EM | Admit: 2021-08-07 | Discharge: 2021-08-08 | Disposition: A | Discharge status: Home / Self Care | Payer: Medicare Other | Attending: Internal Medicine | Admitting: Internal Medicine

## 2021-08-07 ENCOUNTER — Observation Stay: Payer: Medicare Other

## 2021-08-07 ENCOUNTER — Observation Stay (HOSPITAL_BASED_OUTPATIENT_CLINIC_OR_DEPARTMENT_OTHER)
Admit: 2021-08-07 | Discharge: 2021-08-07 | Disposition: A | Discharge status: Home / Self Care | Payer: Medicare Other | Attending: Internal Medicine | Admitting: Internal Medicine

## 2021-08-07 ENCOUNTER — Encounter: Payer: Self-pay | Admitting: Emergency Medicine

## 2021-08-07 DIAGNOSIS — R0789 Other chest pain: Secondary | ICD-10-CM | POA: Insufficient documentation

## 2021-08-07 DIAGNOSIS — Y900 Blood alcohol level of less than 20 mg/100 ml: Secondary | ICD-10-CM | POA: Insufficient documentation

## 2021-08-07 DIAGNOSIS — Z743 Need for continuous supervision: Secondary | ICD-10-CM | POA: Diagnosis not present

## 2021-08-07 DIAGNOSIS — N183 Chronic kidney disease, stage 3 unspecified: Secondary | ICD-10-CM | POA: Insufficient documentation

## 2021-08-07 DIAGNOSIS — G319 Degenerative disease of nervous system, unspecified: Secondary | ICD-10-CM | POA: Diagnosis not present

## 2021-08-07 DIAGNOSIS — R911 Solitary pulmonary nodule: Secondary | ICD-10-CM | POA: Diagnosis not present

## 2021-08-07 DIAGNOSIS — Z87891 Personal history of nicotine dependence: Secondary | ICD-10-CM | POA: Diagnosis not present

## 2021-08-07 DIAGNOSIS — Z79899 Other long term (current) drug therapy: Secondary | ICD-10-CM | POA: Insufficient documentation

## 2021-08-07 DIAGNOSIS — I10 Essential (primary) hypertension: Secondary | ICD-10-CM | POA: Diagnosis not present

## 2021-08-07 DIAGNOSIS — R079 Chest pain, unspecified: Secondary | ICD-10-CM

## 2021-08-07 DIAGNOSIS — R55 Syncope and collapse: Secondary | ICD-10-CM | POA: Diagnosis not present

## 2021-08-07 DIAGNOSIS — I4581 Long QT syndrome: Secondary | ICD-10-CM | POA: Insufficient documentation

## 2021-08-07 DIAGNOSIS — Z7982 Long term (current) use of aspirin: Secondary | ICD-10-CM | POA: Insufficient documentation

## 2021-08-07 DIAGNOSIS — R9082 White matter disease, unspecified: Secondary | ICD-10-CM | POA: Diagnosis not present

## 2021-08-07 DIAGNOSIS — R2689 Other abnormalities of gait and mobility: Secondary | ICD-10-CM | POA: Insufficient documentation

## 2021-08-07 DIAGNOSIS — Z041 Encounter for examination and observation following transport accident: Secondary | ICD-10-CM | POA: Diagnosis not present

## 2021-08-07 DIAGNOSIS — I129 Hypertensive chronic kidney disease with stage 1 through stage 4 chronic kidney disease, or unspecified chronic kidney disease: Secondary | ICD-10-CM | POA: Diagnosis not present

## 2021-08-07 DIAGNOSIS — M19012 Primary osteoarthritis, left shoulder: Secondary | ICD-10-CM | POA: Diagnosis not present

## 2021-08-07 DIAGNOSIS — Z20822 Contact with and (suspected) exposure to covid-19: Secondary | ICD-10-CM | POA: Insufficient documentation

## 2021-08-07 DIAGNOSIS — M160 Bilateral primary osteoarthritis of hip: Secondary | ICD-10-CM | POA: Diagnosis not present

## 2021-08-07 DIAGNOSIS — E041 Nontoxic single thyroid nodule: Secondary | ICD-10-CM | POA: Diagnosis not present

## 2021-08-07 DIAGNOSIS — M19011 Primary osteoarthritis, right shoulder: Secondary | ICD-10-CM | POA: Diagnosis not present

## 2021-08-07 DIAGNOSIS — E876 Hypokalemia: Secondary | ICD-10-CM | POA: Diagnosis not present

## 2021-08-07 DIAGNOSIS — I6523 Occlusion and stenosis of bilateral carotid arteries: Secondary | ICD-10-CM | POA: Diagnosis not present

## 2021-08-07 LAB — BASIC METABOLIC PANEL
Anion gap: 9 (ref 5–15)
BUN: 15 mg/dL (ref 8–23)
CO2: 29 mmol/L (ref 22–32)
Calcium: 9.1 mg/dL (ref 8.9–10.3)
Chloride: 105 mmol/L (ref 98–111)
Creatinine, Ser: 1.23 mg/dL (ref 0.61–1.24)
GFR, Estimated: >60 mL/min (ref >60)
Glucose, Bld: 108 mg/dL — ABNORMAL HIGH (ref 70–99)
Potassium: 3.6 mmol/L (ref 3.5–5.1)
Sodium: 143 mmol/L (ref 135–145)

## 2021-08-07 LAB — URINALYSIS, COMPLETE (UACMP) WITH MICROSCOPIC
Bacteria, UA: NONE SEEN
Bilirubin Urine: NEGATIVE
Glucose, UA: NEGATIVE mg/dL
Hgb urine dipstick: NEGATIVE
Ketones, ur: NEGATIVE mg/dL
Leukocytes,Ua: NEGATIVE
Nitrite: NEGATIVE
Protein, ur: NEGATIVE mg/dL
Specific Gravity, Urine: 1.035 — ABNORMAL HIGH (ref 1.005–1.030)
Squamous Epithelial / HPF: NONE SEEN (ref 0–5)
pH: 7 (ref 5.0–8.0)

## 2021-08-07 LAB — CBC WITH DIFFERENTIAL/PLATELET
Abs Immature Granulocytes: 0.01 10*3/uL (ref 0.00–0.07)
Basophils Absolute: 0 10*3/uL (ref 0.0–0.1)
Basophils Relative: 1 %
Eosinophils Absolute: 0.2 10*3/uL (ref 0.0–0.5)
Eosinophils Relative: 4 %
HCT: 38.7 % — ABNORMAL LOW (ref 39.0–52.0)
Hemoglobin: 13 g/dL (ref 13.0–17.0)
Immature Granulocytes: 0 %
Lymphocytes Relative: 39 %
Lymphs Abs: 1.4 10*3/uL (ref 0.7–4.0)
MCH: 31.6 pg (ref 26.0–34.0)
MCHC: 33.6 g/dL (ref 30.0–36.0)
MCV: 93.9 fL (ref 80.0–100.0)
Monocytes Absolute: 0.4 10*3/uL (ref 0.1–1.0)
Monocytes Relative: 12 %
Neutro Abs: 1.6 10*3/uL — ABNORMAL LOW (ref 1.7–7.7)
Neutrophils Relative %: 44 %
Platelets: 166 10*3/uL (ref 150–400)
RBC: 4.12 MIL/uL — ABNORMAL LOW (ref 4.22–5.81)
RDW: 13.2 % (ref 11.5–15.5)
WBC: 3.7 10*3/uL — ABNORMAL LOW (ref 4.0–10.5)
nRBC: 0 % (ref 0.0–0.2)

## 2021-08-07 LAB — URINE DRUG SCREEN, QUALITATIVE (ARMC ONLY)
Amphetamines, Ur Screen: NOT DETECTED
Barbiturates, Ur Screen: NOT DETECTED
Benzodiazepine, Ur Scrn: NOT DETECTED
Cannabinoid 50 Ng, Ur ~~LOC~~: NOT DETECTED
Cocaine Metabolite,Ur ~~LOC~~: NOT DETECTED
MDMA (Ecstasy)Ur Screen: NOT DETECTED
Methadone Scn, Ur: NOT DETECTED
Opiate, Ur Screen: NOT DETECTED
Phencyclidine (PCP) Ur S: NOT DETECTED
Tricyclic, Ur Screen: NOT DETECTED

## 2021-08-07 LAB — RESP PANEL BY RT-PCR (FLU A&B, COVID) ARPGX2
Influenza A by PCR: NEGATIVE
Influenza B by PCR: NEGATIVE
SARS Coronavirus 2 by RT PCR: NEGATIVE

## 2021-08-07 LAB — TROPONIN I (HIGH SENSITIVITY)
Troponin I (High Sensitivity): 22 ng/L — ABNORMAL HIGH (ref <18)
Troponin I (High Sensitivity): 27 ng/L — ABNORMAL HIGH (ref <18)

## 2021-08-07 MED ORDER — ATORVASTATIN CALCIUM 20 MG PO TABS
20.0000 mg | ORAL_TABLET | Freq: Every day | ORAL | Status: DC
  Administered 2021-08-08: 08:00:00 20 mg via ORAL
  Filled 2021-08-07: qty 1

## 2021-08-07 MED ORDER — PANTOPRAZOLE SODIUM 40 MG PO TBEC
40.0000 mg | DELAYED_RELEASE_TABLET | Freq: Every day | ORAL | Status: DC
  Administered 2021-08-08: 08:00:00 40 mg via ORAL
  Filled 2021-08-07: qty 1

## 2021-08-07 MED ORDER — FENOFIBRIC ACID 135 MG PO CPDR: 1.0000 | DELAYED_RELEASE_CAPSULE | Freq: Every day | ORAL | Status: DC

## 2021-08-07 MED ORDER — DULOXETINE HCL 60 MG PO CPEP: 60.0000 mg | ORAL_CAPSULE | Freq: Every day | ORAL | Status: DC

## 2021-08-07 MED ORDER — IOHEXOL 350 MG/ML SOLN
75.0000 mL | Freq: Once | INTRAVENOUS | Status: AC | PRN
  Administered 2021-08-07: 75 mL via INTRAVENOUS
  Filled 2021-08-07: qty 75

## 2021-08-07 MED ORDER — RISPERIDONE 1 MG PO TABS
2.0000 mg | ORAL_TABLET | Freq: Every day | ORAL | Status: DC
  Administered 2021-08-08: 2 mg via ORAL
  Filled 2021-08-07 (×3): qty 2

## 2021-08-07 MED ORDER — DIVALPROEX SODIUM ER 250 MG PO TB24
250.0000 mg | ORAL_TABLET | Freq: Two times a day (BID) | ORAL | Status: DC
  Administered 2021-08-07 – 2021-08-08 (×2): 250 mg via ORAL
  Filled 2021-08-07 (×3): qty 1

## 2021-08-07 MED ORDER — STROKE: EARLY STAGES OF RECOVERY BOOK: Freq: Once | Status: DC

## 2021-08-07 MED ORDER — ASPIRIN EC 81 MG PO TBEC
81.0000 mg | DELAYED_RELEASE_TABLET | Freq: Every day | ORAL | Status: DC
  Administered 2021-08-08: 08:00:00 81 mg via ORAL
  Filled 2021-08-07: qty 1

## 2021-08-07 NOTE — ED Notes (Signed)
Patient doing MRI screening at this time. 

## 2021-08-07 NOTE — ED Notes (Signed)
EEG at bedside.
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
## 2021-08-07 NOTE — Progress Notes (Signed)
Eeg done 

## 2021-08-07 NOTE — ED Notes (Signed)
CARELINK  CALLED  PER  DR  [facility]  MD
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
## 2021-08-07 NOTE — ED Notes (Signed)
Patient to MRI at this time.
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
## 2021-08-07 NOTE — ED Notes (Signed)
U/s at bedside

## 2021-08-08 ENCOUNTER — Ambulatory Visit: Payer: Medicare Other | Admitting: Family Medicine

## 2021-08-08 ENCOUNTER — Observation Stay (INDEPENDENT_AMBULATORY_CARE_PROVIDER_SITE_OTHER): Payer: Medicare Other

## 2021-08-08 ENCOUNTER — Other Ambulatory Visit: Payer: Self-pay | Admitting: Physician Assistant

## 2021-08-08 DIAGNOSIS — E876 Hypokalemia: Secondary | ICD-10-CM | POA: Diagnosis not present

## 2021-08-08 DIAGNOSIS — R9431 Abnormal electrocardiogram [ECG] [EKG]: Secondary | ICD-10-CM

## 2021-08-08 DIAGNOSIS — R55 Syncope and collapse: Secondary | ICD-10-CM | POA: Diagnosis not present

## 2021-08-08 DIAGNOSIS — R0789 Other chest pain: Secondary | ICD-10-CM | POA: Diagnosis not present

## 2021-08-08 DIAGNOSIS — I10 Essential (primary) hypertension: Secondary | ICD-10-CM

## 2021-08-08 DIAGNOSIS — E785 Hyperlipidemia, unspecified: Secondary | ICD-10-CM | POA: Diagnosis not present

## 2021-08-08 LAB — D-DIMER, QUANTITATIVE: D-Dimer, Quant: <0.27 ug/mL-FEU (ref 0.00–0.50)

## 2021-08-08 LAB — MAGNESIUM: Magnesium: 1.9 mg/dL (ref 1.7–2.4)

## 2021-08-08 LAB — ECHOCARDIOGRAM COMPLETE
AR max vel: 1.65 cm2
AV Area VTI: 1.81 cm2
AV Area mean vel: 1.66 cm2
AV Mean grad: 11.8 mmHg
AV Peak grad: 20.8 mmHg
Ao pk vel: 2.28 m/s
Area-P 1/2: 3.27 cm2
Height: 64 in
P 1/2 time: 456 msec
S' Lateral: 3.3 cm
Weight: 2712.54 oz

## 2021-08-08 LAB — BASIC METABOLIC PANEL
Anion gap: 6 (ref 5–15)
BUN: 18 mg/dL (ref 8–23)
CO2: 29 mmol/L (ref 22–32)
Calcium: 9.2 mg/dL (ref 8.9–10.3)
Chloride: 108 mmol/L (ref 98–111)
Creatinine, Ser: 1.35 mg/dL — ABNORMAL HIGH (ref 0.61–1.24)
GFR, Estimated: 54 mL/min — ABNORMAL LOW (ref >60)
Glucose, Bld: 117 mg/dL — ABNORMAL HIGH (ref 70–99)
Potassium: 3.5 mmol/L (ref 3.5–5.1)
Sodium: 143 mmol/L (ref 135–145)

## 2021-08-08 LAB — GLUCOSE, CAPILLARY
Glucose-Capillary: 109 mg/dL — ABNORMAL HIGH (ref 70–99)
Glucose-Capillary: 112 mg/dL — ABNORMAL HIGH (ref 70–99)
Glucose-Capillary: 115 mg/dL — ABNORMAL HIGH (ref 70–99)

## 2021-08-08 LAB — HEMOGLOBIN A1C
Hgb A1c MFr Bld: 5.8 % — ABNORMAL HIGH (ref 4.8–5.6)
Mean Plasma Glucose: 119.76 mg/dL

## 2021-08-08 LAB — VALPROIC ACID LEVEL: Valproic Acid Lvl: 28 ug/mL — ABNORMAL LOW (ref 50.0–100.0)

## 2021-08-08 LAB — TROPONIN I (HIGH SENSITIVITY)
Troponin I (High Sensitivity): 22 ng/L — ABNORMAL HIGH (ref <18)
Troponin I (High Sensitivity): 22 ng/L — ABNORMAL HIGH (ref <18)

## 2021-08-08 LAB — LIPID PANEL
Cholesterol: 158 mg/dL (ref 0–200)
HDL: 32 mg/dL — ABNORMAL LOW (ref >40)
LDL Cholesterol: 85 mg/dL (ref 0–99)
Total CHOL/HDL Ratio: 4.9 RATIO
Triglycerides: 206 mg/dL — ABNORMAL HIGH (ref <150)
VLDL: 41 mg/dL — ABNORMAL HIGH (ref 0–40)

## 2021-08-08 LAB — ETHANOL: Alcohol, Ethyl (B): <10 mg/dL (ref <10)

## 2021-08-08 LAB — TSH: TSH: 2.238 u[IU]/mL (ref 0.350–4.500)

## 2021-08-08 MED ORDER — POTASSIUM CHLORIDE CRYS ER 20 MEQ PO TBCR
40.0000 meq | EXTENDED_RELEASE_TABLET | Freq: Once | ORAL | Status: AC
  Administered 2021-08-08: 40 meq via ORAL
  Filled 2021-08-08: qty 2

## 2021-08-08 NOTE — Plan of Care (Signed)
Plan of care TIAs/ stroke

## 2021-08-08 NOTE — Progress Notes (Signed)
SLP Cancellation Note  Patient Details Name: PRYNCE JACOBER MRN: [id number] DOB: June 26, 1946   Cancelled treatment:       Reason Eval/Treat Not Completed: SLP screened, no needs identified, will sign off (chart reviewed; consulted NSG then met w/ pt in room). MD consulted briefly as she left the room post visit w/ pt. Pt denied any difficulty swallowing and is currently on a regular diet; tolerates swallowing pills w/ water per NSG. Pt is edentulous. Picked Lunch meal request from Menu w/ pt. Pt conversed in conversation w/out overt receptive or expressive language deficits noted; pt has Prior Dysarthria from previous CVA. Pt denied any new speech-language deficits stating his speech articulation was at his Baseline -- "no changes now".  No further skilled ST services indicated as pt appears at his baseline. Pt agreed. NSG to reconsult if any change in status while admitted.        Orinda Kenner, MS, CCC-SLP Speech Language Pathologist Rehab Services [phone] [facility] 08/08/2021, 11:42 AM

## 2021-08-08 NOTE — Progress Notes (Signed)
Called into room to notify someone from our office would place a [place] AT before discharge.  Confirmed pt aware that he should not drive for 6 months. He stated his understanding.

## 2021-08-08 NOTE — Discharge Summary (Signed)
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   HGBUR NEGATIVE 08/07/2021 2250   BILIRUBINUR NEGATIVE 08/07/2021 2250   BILIRUBINUR Negative 05/30/2021 0926   KETONESUR NEGATIVE 08/07/2021 2250   PROTEINUR NEGATIVE 08/07/2021 2250   NITRITE NEGATIVE 08/07/2021 2250   LEUKOCYTESUR NEGATIVE 08/07/2021 2250   Sepsis Labs Invalid input(s): PROCALCITONIN,  WBC,  LACTICIDVEN Microbiology Recent Results (from the past 240 hour(s))  Resp Panel by RT-PCR (Flu A&B, Covid) Nasopharyngeal Swab     Status: None   Collection Time: 08/07/21 11:38 AM   Specimen: Nasopharyngeal Swab; Nasopharyngeal(NP) swabs in vial transport medium  Result Value Ref Range Status   SARS Coronavirus 2 by RT PCR NEGATIVE NEGATIVE Final    Comment: (NOTE) SARS-CoV-2 target nucleic acids are NOT DETECTED.  The SARS-CoV-2 RNA is generally detectable in upper respiratory specimens during the acute phase of infection.  The lowest concentration of SARS-CoV-2 viral copies this assay can detect is 138 copies/mL. A negative result does not preclude SARS-Cov-2 infection and should not be used as the sole basis for treatment or other patient management decisions. A negative result may occur with  improper specimen collection/handling, submission of specimen other than nasopharyngeal swab, presence of viral mutation(s) within the areas targeted by this assay, and inadequate number of viral copies(<138 copies/mL). A negative result must be combined with clinical observations, patient history, and epidemiological information. The expected result is Negative.  Fact Sheet for Patients:  EntrepreneurPulse.com.au  Fact Sheet for Healthcare Providers:  IncredibleEmployment.be  This test is no t yet approved or cleared by the [place] FDA and  has been authorized for detection and/or diagnosis of SARS-CoV-2 by FDA under an Emergency Use Authorization (EUA). This EUA will remain  in effect (meaning this test can be used) for the duration of the COVID-19 declaration under Section 564(b)(1) of the Act, 21 U.S.C.section 360bbb-3(b)(1), unless the authorization is terminated  or revoked sooner.       Influenza A by PCR NEGATIVE NEGATIVE Final   Influenza B by PCR NEGATIVE NEGATIVE Final    Comment: (NOTE) The Xpert Xpress SARS-CoV-2/FLU/RSV plus assay is intended as an aid in the diagnosis of influenza from Nasopharyngeal swab specimens and should not be used as a sole basis for treatment. Nasal washings and aspirates are unacceptable for Xpert Xpress SARS-CoV-2/FLU/RSV testing.  Fact Sheet for Patients: EntrepreneurPulse.com.au  Fact Sheet for Healthcare Providers: IncredibleEmployment.be  This test is not yet approved or cleared by the [place] FDA and has been authorized for detection and/or diagnosis of SARS-CoV-2 by FDA  under an Emergency Use Authorization (EUA). This EUA will remain in effect (meaning this test can be used) for the duration of the COVID-19 declaration under Section 564(b)(1) of the Act, 21 U.S.C. section 360bbb-3(b)(1), unless the authorization is terminated or revoked.  Performed at [facility], [address] [place], [place] [address]     Time coordinating discharge: Over 30 minutes  SIGNED:  Lorella Nimrod, MD  [facility] 08/08/2021, 3:30 PM  If 7PM-7AM, please contact night-coverage [email or web address]  This record has been created using Systems analyst. Errors have been sought and corrected,but may  not always be located. Such creation errors do not reflect on the standard of care.

## 2021-08-08 NOTE — Progress Notes (Signed)
Received MD order to discharge patient to home today on ZIO monitor placed today by cardiologist office.  Reviewed discharge instructions, home meds, and follow up appointments with patient and patient verbalized understanding.
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
## 2021-08-08 NOTE — Progress Notes (Signed)
Live Zio AT x14 days to be placed before discharge due to suspicion for syncopal event. No driving recommended for at least 6 months. Follow-up recommended in the [place] [facility] to review the monitor.
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
## 2021-08-09 ENCOUNTER — Telehealth: Payer: Self-pay | Admitting: *Deleted

## 2021-08-09 DIAGNOSIS — R55 Syncope and collapse: Secondary | ICD-10-CM | POA: Diagnosis not present

## 2021-08-09 NOTE — Telephone Encounter (Signed)
-----   Message from Arvil Chaco, PA-C sent at 08/08/2021  4:21 PM EDT ----- Regarding: Zio and hospital follow-up Hello,  This patient was admitted and seen at [facility] for possible syncope.  We are expecting discharge today with live Zio AT monitor for 2 weeks (Zio already ordered and placed).  Can you please call and arrange / schedule for office follow-up with Dr. Fletcher Anon or APP in ~ 1 month and after completion of his Zio AT x 2 weeks?   Thank you! Signed, Arvil Chaco, PA-C 08/08/2021, 4:21 PM Pager [phone]

## 2021-08-09 NOTE — Telephone Encounter (Signed)
Patient scheduled with Dr. Fletcher Anon on 09/24/21. Closing encounter.

## 2021-09-10 ENCOUNTER — Encounter (HOSPITAL_COMMUNITY): Payer: Self-pay | Admitting: Radiology

## 2021-09-19 ENCOUNTER — Telehealth: Payer: Self-pay | Admitting: Physician Assistant

## 2021-09-19 NOTE — Telephone Encounter (Signed)
Visser, Jacquelyn D, PA-C  P Cv Div [place] Triage Monitor showed  --Predominantly a normal heart rhythm with average heart rate of 82 bpm  --5 very brief runs of a faster heart rhythm, which is not concerning  --Rare extra beats from the top and bottom chambers, which is not concerning   Recommendations:  --No concerning arrhythmias / conduction abnormalities that would explain his syncope  --Follow-up as scheduled with Dr. Fletcher Anon to review monitor results

## 2021-09-19 NOTE — Telephone Encounter (Signed)
Called to give the patient cardiac monitor results. Lmtcb.

## 2021-09-23 ENCOUNTER — Ambulatory Visit (INDEPENDENT_AMBULATORY_CARE_PROVIDER_SITE_OTHER): Payer: Medicare Other | Admitting: Family Medicine

## 2021-09-23 ENCOUNTER — Other Ambulatory Visit: Payer: Self-pay

## 2021-09-23 ENCOUNTER — Encounter: Payer: Self-pay | Admitting: Family Medicine

## 2021-09-23 VITALS — BP 146/68 | HR 75 | Ht 64.0 in | Wt 179.0 lb

## 2021-09-23 DIAGNOSIS — F3170 Bipolar disorder, currently in remission, most recent episode unspecified: Secondary | ICD-10-CM

## 2021-09-23 DIAGNOSIS — E041 Nontoxic single thyroid nodule: Secondary | ICD-10-CM | POA: Diagnosis not present

## 2021-09-23 DIAGNOSIS — I10 Essential (primary) hypertension: Secondary | ICD-10-CM | POA: Diagnosis not present

## 2021-09-23 DIAGNOSIS — Z23 Encounter for immunization: Secondary | ICD-10-CM

## 2021-09-23 MED ORDER — ATORVASTATIN CALCIUM 20 MG PO TABS: 20.0000 mg | ORAL_TABLET | Freq: Every day | ORAL | 1 refills | Status: DC

## 2021-09-23 MED ORDER — LOSARTAN POTASSIUM 50 MG PO TABS: 50.0000 mg | ORAL_TABLET | Freq: Every day | ORAL | 1 refills | Status: DC

## 2021-09-23 MED ORDER — DULOXETINE HCL 20 MG PO CPEP: ORAL_CAPSULE | ORAL | 3 refills | Status: DC

## 2021-09-23 MED ORDER — OMEPRAZOLE 40 MG PO CPDR: 40.0000 mg | DELAYED_RELEASE_CAPSULE | Freq: Every day | ORAL | 5 refills | Status: DC

## 2021-09-23 MED ORDER — DIVALPROEX SODIUM ER 250 MG PO TB24: 250.0000 mg | ORAL_TABLET | Freq: Two times a day (BID) | ORAL | 1 refills | Status: DC

## 2021-09-23 MED ORDER — BUPROPION HCL ER (XL) 300 MG PO TB24: 300.0000 mg | ORAL_TABLET | Freq: Every day | ORAL | 1 refills | Status: DC

## 2021-09-23 MED ORDER — RISPERIDONE 2 MG PO TABS: 2.0000 mg | ORAL_TABLET | Freq: Every day | ORAL | 1 refills | Status: DC

## 2021-09-23 NOTE — Assessment & Plan Note (Signed)
Running a little bit high systolically, but given his syncope and his mood being out of control, will treat his mood and recheck next month. Call with any concerns.

## 2021-09-23 NOTE — Assessment & Plan Note (Signed)
No clear reason why his cymbalta was stopped. EEG was normal. Mood very out of control since stopping it. Restart cymbalta at 20mg  and titrate up. Recheck 1 month. Call with any concerns.

## 2021-09-23 NOTE — Progress Notes (Signed)
DMV form faxed to [phone].  Copy given to patient.  Copy saved in office in fax bin.

## 2021-09-23 NOTE — Progress Notes (Signed)
BP (!) 146/68   Pulse 75   Ht 5\' 4"  (1.626 m)   Wt 179 lb (81.2 kg)   BMI 30.73 kg/m    Subjective:    Patient ID: Dean Larsen, male    DOB: 1946/03/06, 75 y.o.   MRN: [id number]  HPI: Dean Larsen is a 75 y.o. male  Chief Complaint  Patient presents with   Hypertension   Depression   HYPERTENSION Hypertension status: uncontrolled  Satisfied with current treatment? yes Duration of hypertension: chronic BP monitoring frequency:  not checking BP medication side effects:  no Medication compliance: excellent compliance Previous BP meds:losartan Aspirin: no Recurrent headaches: no Visual changes: no Palpitations: no Dyspnea: no Chest pain: no Lower extremity edema: no Dizzy/lightheaded: no  DEPRESSION Mood status: exacerbated Satisfied with current treatment?: no Symptom severity: moderate  Duration of current treatment : chronic Side effects: no Medication compliance: excellent compliance Psychotherapy/counseling: no  Previous psychiatric medications: cymbalta, risperidone, wellbutrin, depakote Depressed mood: yes Anxious mood: yes Anhedonia: no Significant weight loss or gain: no Insomnia: no  Fatigue: yes Feelings of worthlessness or guilt: no Impaired concentration/indecisiveness: no Suicidal ideations: no Hopelessness: no Crying spells: no Depression screen [facility] 2/9 09/23/2021 05/21/2021 04/23/2021 04/15/2021 04/11/2020  Decreased Interest 3 0 0 0 0  Down, Depressed, Hopeless 3 0 0 0 0  PHQ - 2 Score 6 0 0 0 0  Altered sleeping 3 0 0 - -  Tired, decreased energy 3 0 0 - -  Change in appetite 3 0 0 - -  Feeling bad or failure about yourself  1 0 0 - -  Trouble concentrating 0 0 0 - -  Moving slowly or fidgety/restless 0 0 0 - -  Suicidal thoughts 0 0 0 - -  PHQ-9 Score 16 0 0 - -  Difficult doing work/chores Very difficult Not difficult at all Not difficult at all - -   Thyroid nodule found incidentally on CT. Needs [place]  Relevant past medical,  surgical, family and social history reviewed and updated as indicated. Interim medical history since our last visit reviewed. Allergies and medications reviewed and updated.  Review of Systems  Constitutional: Negative.   Respiratory: Negative.    Cardiovascular: Negative.   Gastrointestinal: Negative.   Musculoskeletal: Negative.   Skin: Negative.   Neurological: Negative.   Psychiatric/Behavioral:  Positive for dysphoric mood. Negative for agitation, behavioral problems, confusion, decreased concentration, hallucinations, self-injury, sleep disturbance and suicidal ideas. The patient is not nervous/anxious and is not hyperactive.    Per HPI unless specifically indicated above     Objective:    BP (!) 146/68   Pulse 75   Ht 5\' 4"  (1.626 m)   Wt 179 lb (81.2 kg)   BMI 30.73 kg/m   Wt Readings from Last 3 Encounters:  09/23/21 179 lb (81.2 kg)  08/07/21 169 lb 8.5 oz (76.9 kg)  05/30/21 179 lb 3.2 oz (81.3 kg)    Physical Exam Vitals and nursing note reviewed.  Constitutional:      General: He is not in acute distress.    Appearance: Normal appearance. He is not ill-appearing, toxic-appearing or diaphoretic.  HENT:     Head: Normocephalic and atraumatic.     Right Ear: External ear normal.     Left Ear: External ear normal.     Nose: Nose normal.     Mouth/Throat:     Mouth: Mucous membranes are moist.     Pharynx: Oropharynx is clear.  Eyes:  General: No scleral icterus.       Right eye: No discharge.        Left eye: No discharge.     Extraocular Movements: Extraocular movements intact.     Conjunctiva/sclera: Conjunctivae normal.     Pupils: Pupils are equal, round, and reactive to light.  Cardiovascular:     Rate and Rhythm: Normal rate and regular rhythm.     Pulses: Normal pulses.     Heart sounds: Normal heart sounds. No murmur heard.   No friction rub. No gallop.  Pulmonary:     Effort: Pulmonary effort is normal. No respiratory distress.     Breath  sounds: Normal breath sounds. No stridor. No wheezing, rhonchi or rales.  Chest:     Chest wall: No tenderness.  Musculoskeletal:        General: Normal range of motion.     Cervical back: Normal range of motion and neck supple.  Skin:    General: Skin is warm and dry.     Capillary Refill: Capillary refill takes less than 2 seconds.     Coloration: Skin is not jaundiced or pale.     Findings: No bruising, erythema, lesion or rash.  Neurological:     General: No focal deficit present.     Mental Status: He is alert and oriented to person, place, and time. Mental status is at baseline.  Psychiatric:        Mood and Affect: Mood normal.        Behavior: Behavior normal.        Thought Content: Thought content normal.        Judgment: Judgment normal.    Results for orders placed or performed during the hospital encounter of 08/07/21  Resp Panel by RT-PCR (Flu A&B, Covid) Nasopharyngeal Swab   Specimen: Nasopharyngeal Swab; Nasopharyngeal(NP) swabs in vial transport medium  Result Value Ref Range   SARS Coronavirus 2 by RT PCR NEGATIVE NEGATIVE   Influenza A by PCR NEGATIVE NEGATIVE   Influenza B by PCR NEGATIVE NEGATIVE  Basic metabolic panel  Result Value Ref Range   Sodium 143 135 - 145 mmol/L   Potassium 3.6 3.5 - 5.1 mmol/L   Chloride 105 98 - 111 mmol/L   CO2 29 22 - 32 mmol/L   Glucose, Bld 108 (H) 70 - 99 mg/dL   BUN 15 8 - 23 mg/dL   Creatinine, Ser 1.23 0.61 - 1.24 mg/dL   Calcium 9.1 8.9 - 10.3 mg/dL   GFR, Estimated >60 >60 mL/min   Anion gap 9 5 - 15  CBC with Differential  Result Value Ref Range   WBC 3.7 (L) 4.0 - 10.5 K/uL   RBC 4.12 (L) 4.22 - 5.81 MIL/uL   Hemoglobin 13.0 13.0 - 17.0 g/dL   HCT 38.7 (L) 39.0 - 52.0 %   MCV 93.9 80.0 - 100.0 fL   MCH 31.6 26.0 - 34.0 pg   MCHC 33.6 30.0 - 36.0 g/dL   RDW 13.2 11.5 - 15.5 %   Platelets 166 150 - 400 K/uL   nRBC 0.0 0.0 - 0.2 %   Neutrophils Relative % 44 %   Neutro Abs 1.6 (L) 1.7 - 7.7 K/uL    Lymphocytes Relative 39 %   Lymphs Abs 1.4 0.7 - 4.0 K/uL   Monocytes Relative 12 %   Monocytes Absolute 0.4 0.1 - 1.0 K/uL   Eosinophils Relative 4 %   Eosinophils Absolute 0.2 0.0 - 0.5 K/uL  Basophils Relative 1 %   Basophils Absolute 0.0 0.0 - 0.1 K/uL   Immature Granulocytes 0 %   Abs Immature Granulocytes 0.01 0.00 - 0.07 K/uL  Urine Drug Screen, Qualitative ([facility] only)  Result Value Ref Range   Tricyclic, Ur Screen NONE DETECTED NONE DETECTED   Amphetamines, Ur Screen NONE DETECTED NONE DETECTED   MDMA (Ecstasy)Ur Screen NONE DETECTED NONE DETECTED   Cocaine Metabolite,Ur [place] NONE DETECTED NONE DETECTED   Opiate, Ur Screen NONE DETECTED NONE DETECTED   Phencyclidine (PCP) Ur S NONE DETECTED NONE DETECTED   Cannabinoid 50 Ng, Ur Day NONE DETECTED NONE DETECTED   Barbiturates, Ur Screen NONE DETECTED NONE DETECTED   Benzodiazepine, Ur Scrn NONE DETECTED NONE DETECTED   Methadone Scn, Ur NONE DETECTED NONE DETECTED  Urinalysis, Complete w Microscopic  Result Value Ref Range   Color, Urine YELLOW (A) YELLOW   APPearance CLEAR (A) CLEAR   Specific Gravity, Urine 1.035 (H) 1.005 - 1.030   pH 7.0 5.0 - 8.0   Glucose, UA NEGATIVE NEGATIVE mg/dL   Hgb urine dipstick NEGATIVE NEGATIVE   Bilirubin Urine NEGATIVE NEGATIVE   Ketones, ur NEGATIVE NEGATIVE mg/dL   Protein, ur NEGATIVE NEGATIVE mg/dL   Nitrite NEGATIVE NEGATIVE   Leukocytes,Ua NEGATIVE NEGATIVE   RBC / HPF 0-5 0 - 5 RBC/hpf   WBC, UA 0-5 0 - 5 WBC/hpf   Bacteria, UA NONE SEEN NONE SEEN   Squamous Epithelial / LPF NONE SEEN 0 - 5  Hemoglobin A1c  Result Value Ref Range   Hgb A1c MFr Bld 5.8 (H) 4.8 - 5.6 %   Mean Plasma Glucose 119.76 mg/dL  Lipid panel  Result Value Ref Range   Cholesterol 158 0 - 200 mg/dL   Triglycerides 206 (H) <150 mg/dL   HDL 32 (L) >40 mg/dL   Total CHOL/HDL Ratio 4.9 RATIO   VLDL 41 (H) 0 - 40 mg/dL   LDL Cholesterol 85 0 - 99 mg/dL  Basic metabolic panel  Result Value Ref Range    Sodium 143 135 - 145 mmol/L   Potassium 3.5 3.5 - 5.1 mmol/L   Chloride 108 98 - 111 mmol/L   CO2 29 22 - 32 mmol/L   Glucose, Bld 117 (H) 70 - 99 mg/dL   BUN 18 8 - 23 mg/dL   Creatinine, Ser 1.35 (H) 0.61 - 1.24 mg/dL   Calcium 9.2 8.9 - 10.3 mg/dL   GFR, Estimated 54 (L) >60 mL/min   Anion gap 6 5 - 15  D-dimer, quantitative  Result Value Ref Range   D-Dimer, Quant <0.27 0.00 - 0.50 ug/mL-FEU  Ethanol  Result Value Ref Range   Alcohol, Ethyl (B) <10 <10 mg/dL  Magnesium  Result Value Ref Range   Magnesium 1.9 1.7 - 2.4 mg/dL  Valproic acid level  Result Value Ref Range   Valproic Acid Lvl 28 (L) 50.0 - 100.0 ug/mL  Glucose, capillary  Result Value Ref Range   Glucose-Capillary 109 (H) 70 - 99 mg/dL  TSH  Result Value Ref Range   TSH 2.238 0.350 - 4.500 uIU/mL  Glucose, capillary  Result Value Ref Range   Glucose-Capillary 112 (H) 70 - 99 mg/dL  Glucose, capillary  Result Value Ref Range   Glucose-Capillary 115 (H) 70 - 99 mg/dL  ECHOCARDIOGRAM COMPLETE  Result Value Ref Range   Weight 2,712.54 oz   Height 64 in   BP 159/73 mmHg   Ao pk vel 2.28 m/s   AV Area VTI  1.81 cm2   AR max vel 1.65 cm2   AV Mean grad 11.8 mmHg   AV Peak grad 20.8 mmHg   S' Lateral 3.30 cm   AV Area mean vel 1.66 cm2   Area-P 1/2 3.27 cm2   P 1/2 time 456 msec  Troponin I (High Sensitivity)  Result Value Ref Range   Troponin I (High Sensitivity) 22 (H) <18 ng/L  Troponin I (High Sensitivity)  Result Value Ref Range   Troponin I (High Sensitivity) 27 (H) <18 ng/L  Troponin I (High Sensitivity)  Result Value Ref Range   Troponin I (High Sensitivity) 22 (H) <18 ng/L  Troponin I (High Sensitivity)  Result Value Ref Range   Troponin I (High Sensitivity) 22 (H) <18 ng/L      Assessment & Plan:   Problem List Items Addressed This Visit       Cardiovascular and Mediastinum   Essential hypertension    Running a little bit high systolically, but given his syncope and his mood being  out of control, will treat his mood and recheck next month. Call with any concerns.       Relevant Medications   losartan (COZAAR) 50 MG tablet   atorvastatin (LIPITOR) 20 MG tablet   Other Relevant Orders   Basic metabolic panel   CBC with Differential/Platelet     Other   Bipolar disorder ([place])    No clear reason why his cymbalta was stopped. EEG was normal. Mood very out of control since stopping it. Restart cymbalta at 20mg  and titrate up. Recheck 1 month. Call with any concerns.       Relevant Medications   risperiDONE (RISPERDAL) 2 MG tablet   divalproex (DEPAKOTE ER) 250 MG 24 hr tablet   buPROPion (WELLBUTRIN XL) 300 MG 24 hr tablet   Other Visit Diagnoses     Thyroid nodule    -  Primary   Found incidently on CT. Will get thyroid US to further evaluate. Call with any concerns.    Relevant Orders   US THYROID   Needs flu shot       Relevant Orders   Flu Vaccine QUAD High Dose(Fluad) (Completed)        Follow up plan: Return in about 4 weeks (around 10/21/2021).

## 2021-09-23 NOTE — Telephone Encounter (Signed)
This patient has an appointment on 09/24/21 with Dr. Fletcher Anon.

## 2021-09-24 ENCOUNTER — Encounter: Payer: Self-pay | Admitting: Cardiovascular Disease

## 2021-09-24 ENCOUNTER — Ambulatory Visit: Payer: Medicare Other | Admitting: Cardiovascular Disease

## 2021-09-24 VITALS — BP 143/74 | HR 76 | Ht 63.0 in | Wt 180.5 lb

## 2021-09-24 DIAGNOSIS — R55 Syncope and collapse: Secondary | ICD-10-CM

## 2021-09-24 DIAGNOSIS — I10 Essential (primary) hypertension: Secondary | ICD-10-CM

## 2021-09-24 DIAGNOSIS — I359 Nonrheumatic aortic valve disorder, unspecified: Secondary | ICD-10-CM

## 2021-09-24 DIAGNOSIS — E785 Hyperlipidemia, unspecified: Secondary | ICD-10-CM

## 2021-09-24 LAB — CBC WITH DIFFERENTIAL/PLATELET
Basophils Absolute: 0.1 10*3/uL (ref 0.0–0.2)
Basos: 1 %
EOS (ABSOLUTE): 0.1 10*3/uL (ref 0.0–0.4)
Eos: 2 %
Hematocrit: 40.9 % (ref 37.5–51.0)
Hemoglobin: 13.5 g/dL (ref 13.0–17.7)
Immature Grans (Abs): 0 10*3/uL (ref 0.0–0.1)
Immature Granulocytes: 0 %
Lymphocytes Absolute: 1.8 10*3/uL (ref 0.7–3.1)
Lymphs: 33 %
MCH: 30.1 pg (ref 26.6–33.0)
MCHC: 33 g/dL (ref 31.5–35.7)
MCV: 91 fL (ref 79–97)
Monocytes Absolute: 0.8 10*3/uL (ref 0.1–0.9)
Monocytes: 14 %
Neutrophils Absolute: 2.8 10*3/uL (ref 1.4–7.0)
Neutrophils: 50 %
Platelets: 204 10*3/uL (ref 150–450)
RBC: 4.49 x10E6/uL (ref 4.14–5.80)
RDW: 12.3 % (ref 11.6–15.4)
WBC: 5.5 10*3/uL (ref 3.4–10.8)

## 2021-09-24 LAB — BASIC METABOLIC PANEL
BUN/Creatinine Ratio: 13 (ref 10–24)
BUN: 19 mg/dL (ref 8–27)
CO2: 23 mmol/L (ref 20–29)
Calcium: 9.9 mg/dL (ref 8.6–10.2)
Chloride: 105 mmol/L (ref 96–106)
Creatinine, Ser: 1.52 mg/dL — ABNORMAL HIGH (ref 0.76–1.27)
Glucose: 84 mg/dL (ref 70–99)
Potassium: 4.5 mmol/L (ref 3.5–5.2)
Sodium: 143 mmol/L (ref 134–144)
eGFR: 47 mL/min/{1.73_m2} — ABNORMAL LOW (ref >59)

## 2021-09-24 NOTE — Patient Instructions (Signed)

## 2021-09-24 NOTE — Telephone Encounter (Signed)
Patient made aware of results at todays o/v.
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
## 2021-10-07 ENCOUNTER — Ambulatory Visit
Admission: RE | Admit: 2021-10-07 | Discharge: 2021-10-07 | Disposition: A | Discharge status: Home / Self Care | Payer: Medicare Other | Source: Ambulatory Visit | Attending: Family Medicine | Admitting: Family Medicine

## 2021-10-07 ENCOUNTER — Other Ambulatory Visit: Payer: Self-pay

## 2021-10-07 DIAGNOSIS — E041 Nontoxic single thyroid nodule: Secondary | ICD-10-CM

## 2021-10-09 ENCOUNTER — Other Ambulatory Visit: Payer: Self-pay | Admitting: Family Medicine

## 2021-10-09 DIAGNOSIS — E041 Nontoxic single thyroid nodule: Secondary | ICD-10-CM

## 2021-10-28 ENCOUNTER — Encounter: Payer: Self-pay | Admitting: Family Medicine

## 2021-10-28 ENCOUNTER — Ambulatory Visit (INDEPENDENT_AMBULATORY_CARE_PROVIDER_SITE_OTHER): Payer: Medicare Other | Admitting: Family Medicine

## 2021-10-28 ENCOUNTER — Other Ambulatory Visit: Payer: Self-pay

## 2021-10-28 VITALS — BP 129/75 | HR 72 | Temp 97.8°F | Wt 176.4 lb

## 2021-10-28 DIAGNOSIS — E041 Nontoxic single thyroid nodule: Secondary | ICD-10-CM

## 2021-10-28 DIAGNOSIS — F3132 Bipolar disorder, current episode depressed, moderate: Secondary | ICD-10-CM | POA: Diagnosis not present

## 2021-10-28 MED ORDER — DULOXETINE HCL 40 MG PO CPEP: 40.0000 mg | ORAL_CAPSULE | Freq: Every day | ORAL | 1 refills | Status: DC

## 2021-10-28 NOTE — Assessment & Plan Note (Signed)
Doing much better back on his cymbalta. Will stay on current regimen. Call with any concerns. Refills given. Follow up in 5 months.

## 2021-10-28 NOTE — Progress Notes (Signed)
BP 129/75   Pulse 72   Temp 97.8 F (36.6 C)   Wt 176 lb 6.4 oz (80 kg)   SpO2 97%   BMI 31.25 kg/m    Subjective:    Patient ID: Dean Larsen, male    DOB: [birth year], 75 y.o.   MRN: [id number]  HPI: Dean Larsen is a 75 y.o. male  Chief Complaint  Patient presents with   mood   BIPOLAR Mood status: better Satisfied with current treatment?: yes Symptom severity: mild  Duration of current treatment : chronic Side effects: no Medication compliance: excellent compliance Psychotherapy/counseling: no  Previous psychiatric medications: wellbutrin, risperidone, depakote Depressed mood: yes Anxious mood: no Anhedonia: no Significant weight loss or gain: no Insomnia: no  Fatigue: no Feelings of worthlessness or guilt: no Impaired concentration/indecisiveness: no Suicidal ideations: no Hopelessness: no Crying spells: no Depression screen [facility] 2/9 10/28/2021 09/23/2021 05/21/2021 04/23/2021 04/15/2021  Decreased Interest 1 3 0 0 0  Down, Depressed, Hopeless 0 3 0 0 0  PHQ - 2 Score 1 6 0 0 0  Altered sleeping 0 3 0 0 -  Tired, decreased energy 1 3 0 0 -  Change in appetite 0 3 0 0 -  Feeling bad or failure about yourself  0 1 0 0 -  Trouble concentrating 0 0 0 0 -  Moving slowly or fidgety/restless 0 0 0 0 -  Suicidal thoughts 0 0 0 0 -  PHQ-9 Score 2 16 0 0 -  Difficult doing work/chores Not difficult at all Very difficult Not difficult at all Not difficult at all -    Relevant past medical, surgical, family and social history reviewed and updated as indicated. Interim medical history since our last visit reviewed. Allergies and medications reviewed and updated.  Review of Systems  Constitutional: Negative.   Respiratory: Negative.    Cardiovascular: Negative.   Gastrointestinal: Negative.   Musculoskeletal: Negative.   Psychiatric/Behavioral:  Positive for dysphoric mood. Negative for agitation, behavioral problems, confusion, decreased concentration,  hallucinations, self-injury, sleep disturbance and suicidal ideas. The patient is not nervous/anxious and is not hyperactive.    Per HPI unless specifically indicated above     Objective:    BP 129/75   Pulse 72   Temp 97.8 F (36.6 C)   Wt 176 lb 6.4 oz (80 kg)   SpO2 97%   BMI 31.25 kg/m   Wt Readings from Last 3 Encounters:  10/28/21 176 lb 6.4 oz (80 kg)  09/24/21 180 lb 8 oz (81.9 kg)  09/23/21 179 lb (81.2 kg)    Physical Exam Vitals and nursing note reviewed.  Constitutional:      General: He is not in acute distress.    Appearance: Normal appearance. He is not ill-appearing, toxic-appearing or diaphoretic.  HENT:     Head: Normocephalic and atraumatic.     Right Ear: External ear normal.     Left Ear: External ear normal.     Nose: Nose normal.     Mouth/Throat:     Mouth: Mucous membranes are moist.     Pharynx: Oropharynx is clear.  Eyes:     General: No scleral icterus.       Right eye: No discharge.        Left eye: No discharge.     Extraocular Movements: Extraocular movements intact.     Conjunctiva/sclera: Conjunctivae normal.     Pupils: Pupils are equal, round, and reactive to light.  Cardiovascular:  Rate and Rhythm: Normal rate and regular rhythm.     Pulses: Normal pulses.     Heart sounds: Normal heart sounds. No murmur heard.   No friction rub. No gallop.  Pulmonary:     Effort: Pulmonary effort is normal. No respiratory distress.     Breath sounds: Normal breath sounds. No stridor. No wheezing, rhonchi or rales.  Chest:     Chest wall: No tenderness.  Musculoskeletal:        General: Normal range of motion.     Cervical back: Normal range of motion and neck supple.  Skin:    General: Skin is warm and dry.     Capillary Refill: Capillary refill takes less than 2 seconds.     Coloration: Skin is not jaundiced or pale.     Findings: No bruising, erythema, lesion or rash.  Neurological:     General: No focal deficit present.     Mental  Status: He is alert and oriented to person, place, and time. Mental status is at baseline.  Psychiatric:        Mood and Affect: Mood normal.        Behavior: Behavior normal.        Thought Content: Thought content normal.        Judgment: Judgment normal.    Results for orders placed or performed in visit on 01/00/71  Basic metabolic panel  Result Value Ref Range   Glucose 84 70 - 99 mg/dL   BUN 19 8 - 27 mg/dL   Creatinine, Ser 1.52 (H) 0.76 - 1.27 mg/dL   eGFR 47 (L) >59 mL/min/1.73   BUN/Creatinine Ratio 13 10 - 24   Sodium 143 134 - 144 mmol/L   Potassium 4.5 3.5 - 5.2 mmol/L   Chloride 105 96 - 106 mmol/L   CO2 23 20 - 29 mmol/L   Calcium 9.9 8.6 - 10.2 mg/dL  CBC with Differential/Platelet  Result Value Ref Range   WBC 5.5 3.4 - 10.8 x10E3/uL   RBC 4.49 4.14 - 5.80 x10E6/uL   Hemoglobin 13.5 13.0 - 17.7 g/dL   Hematocrit 40.9 37.5 - 51.0 %   MCV 91 79 - 97 fL   MCH 30.1 26.6 - 33.0 pg   MCHC 33.0 31.5 - 35.7 g/dL   RDW 12.3 11.6 - 15.4 %   Platelets 204 150 - 450 x10E3/uL   Neutrophils 50 Not Estab. %   Lymphs 33 Not Estab. %   Monocytes 14 Not Estab. %   Eos 2 Not Estab. %   Basos 1 Not Estab. %   Neutrophils Absolute 2.8 1.4 - 7.0 x10E3/uL   Lymphocytes Absolute 1.8 0.7 - 3.1 x10E3/uL   Monocytes Absolute 0.8 0.1 - 0.9 x10E3/uL   EOS (ABSOLUTE) 0.1 0.0 - 0.4 x10E3/uL   Basophils Absolute 0.1 0.0 - 0.2 x10E3/uL   Immature Granulocytes 0 Not Estab. %   Immature Grans (Abs) 0.0 0.0 - 0.1 x10E3/uL      Assessment & Plan:   Problem List Items Addressed This Visit       Other   Bipolar disorder ([place]) - Primary    Doing much better back on his cymbalta. Will stay on current regimen. Call with any concerns. Refills given. Follow up in 5 months.       Other Visit Diagnoses     Thyroid nodule       Thyroid surgeon no longer at [facility]. Will refer to ENT. Referral generated for FNA.  Relevant Orders   Ambulatory referral to ENT        Follow  up plan: Return in about 5 months (around 03/28/2022), or physical.

## 2021-11-18 ENCOUNTER — Ambulatory Visit: Payer: Self-pay | Admitting: *Deleted

## 2021-11-18 NOTE — Telephone Encounter (Signed)
Per agent: "Pt's daughter called saying her father had an U a couple weeks back and was told he would need a biopsy for the nodule.  They have not heard anything back from anyone.  She is asking if  nurse or someone can call them back."  Pt's daughter Anderson [place], on [place], states  they have not heard back from ENT regarding scheduling for biopsy of thyroid. Noted referral sent 10/28/21. Please advise: CB# [phone].  States can leave detailed message if goes to VM.     Reason for Disposition  [1] Caller requesting NON-URGENT health information AND [2] PCP's office is the best resource  Answer Assessment - Initial Assessment Questions 1. REASON FOR CALL or QUESTION: "What is your reason for calling today?" or "How can I best help you?" or "What question do you have that I can help answer?"     Regarding referral to ENT  Protocols used: Information Only Call - No Triage-A-AH

## 2021-11-29 ENCOUNTER — Other Ambulatory Visit: Payer: Self-pay

## 2021-11-29 ENCOUNTER — Other Ambulatory Visit: Payer: Medicare Other

## 2021-11-29 DIAGNOSIS — R972 Elevated prostate specific antigen [PSA]: Secondary | ICD-10-CM

## 2021-11-30 LAB — PSA: Prostate Specific Ag, Serum: 17.7 ng/mL — ABNORMAL HIGH (ref 0.0–4.0)

## 2021-12-04 ENCOUNTER — Other Ambulatory Visit: Payer: Self-pay

## 2021-12-04 ENCOUNTER — Encounter: Payer: Self-pay | Admitting: Urology

## 2021-12-04 ENCOUNTER — Ambulatory Visit: Payer: Medicare Other | Admitting: Urology

## 2021-12-04 VITALS — BP 143/77 | HR 87 | Ht 63.0 in | Wt 176.0 lb

## 2021-12-04 DIAGNOSIS — R972 Elevated prostate specific antigen [PSA]: Secondary | ICD-10-CM

## 2021-12-04 NOTE — Progress Notes (Signed)
   12/04/2021 11:06 AM   Fenton Malling [birth year] [id number]  Reason for visit: Follow up elevated PSA  HPI: 75 year old male with medical history notable for bipolar disorder and stroke who I originally saw in September 2021 for a persistently rising PSA.  PSA was 4.1 in April 2017, 7 in April 2018, 10.7 in February 2021, and 14.4 in August 2021.  DRE has been benign.  We had previously discussed extensively the risks and benefits of biopsy, and he and his daughter deferred biopsy.  They understand the risks of missing a clinically significant prostate cancer, and we reviewed the AUA guidelines regarding the recommendation against routine screening in men over age 3.   Most recent PSA on 11/29/2021 continued to rise slowly at 17.7 from 14.3 in May 2022, 15.3 in April 2022, and 14.4 in August 2021.  Recent urinalysis was completely benign.   He denies any urinary symptoms or gross hematuria.   We again extensively reviewed the risk of deferring biopsy and developing metastatic disease with inability to cure, and the risks of biopsy including bleeding, infection, pain, as well as side effects from treatment for possible prostate cancer were again discussed extensively.  They remain very opposed to undergoing a biopsy.  They were amenable to repeating a PSA in 9 months and continuing to monitor, with the understanding that if the PSA continues to rise greater than 20 this likely represents a prostate cancer and I would again recommend biopsy.   RTC 9 months PSA prior    [name], MD  [facility] [address], [place], [place] [address] [phone]

## 2021-12-04 NOTE — Patient Instructions (Signed)
Prostate Cancer Screening ?Prostate cancer screening is testing that is done to check for the presence of prostate cancer in men. The prostate gland is a walnut-sized gland that is located below the bladder and in front of the rectum in males. The function of the prostate is to add fluid to semen during ejaculation. Prostate cancer is one of the most common types of cancer in men. ?Who should have prostate cancer screening? ?Screening recommendations vary based on age and other risk factors, as well as between the professional organizations who make the recommendations. ?In general, screening is recommended if: ?You are age 50 to 70 and have an average risk for prostate cancer. You should talk with your health care provider about your need for screening and how often screening should be done. Because most prostate cancers are slow growing and will not cause death, screening in this age group is generally reserved for men who have a 10- to 15-year life expectancy. ?You are younger than age 50, and you have these risk factors: ?Having a father, brother, or uncle who has been diagnosed with prostate cancer. The risk is higher if your family member's cancer occurred at an early age or if you have multiple family members with prostate cancer at an early age. ?Being a male who is Black or is of Caribbean or sub-Saharan African descent. ?In general, screening is not recommended if: ?You are younger than age 40. ?You are between the ages of 40 and 49 and you have no risk factors. ?You are 70 years of age or older. At this age, the risks that screening can cause are greater than the benefits that it may provide. ?If you are at high risk for prostate cancer, your health care provider may recommend that you have screenings more often or that you start screening at a younger age. ?How is screening for prostate cancer done? ?The recommended prostate cancer screening test is a blood test called the prostate-specific antigen (PSA)  test. PSA is a protein that is made in the prostate. As you age, your prostate naturally produces more PSA. Abnormally high PSA levels may be caused by: ?Prostate cancer. ?An enlarged prostate that is not caused by cancer (benign prostatic hyperplasia, or BPH). This condition is very common in older men. ?A prostate gland infection (prostatitis) or urinary tract infection. ?Certain medicines such as male hormones (like testosterone) or other medicines that raise testosterone levels. ?A rectal exam may be done as part of prostate cancer screening to help provide information about the size of your prostate gland. When a rectal exam is performed, it should be done after the PSA level is drawn to avoid any effect on the results. ?Depending on the PSA results, you may need more tests, such as: ?A physical exam to check the size of your prostate gland, if not done as part of screening. ?Blood and imaging tests. ?A procedure to remove tissue samples from your prostate gland for testing (biopsy). This is the only way to know for certain if you have prostate cancer. ?What are the benefits of prostate cancer screening? ?Screening can help to identify cancer at an early stage, before symptoms start and when the cancer can be treated more easily. ?There is a small chance that screening may lower your risk of dying from prostate cancer. The chance is small because prostate cancer is a slow-growing cancer, and most men with prostate cancer die from a different cause. ?What are the risks of prostate cancer screening? ?The main   risk of prostate cancer screening is diagnosing and treating prostate cancer that would never have caused any symptoms or problems. This is called overdiagnosisand overtreatment. PSA screening cannot tell you if your PSA is high due to cancer or a different cause. A prostate biopsy is the only procedure to diagnose prostate cancer. Even the results of a biopsy may not tell you if your cancer needs to be  treated. Slow-growing prostate cancer may not need any treatment other than monitoring, so diagnosing and treating it may cause unnecessary stress or other side effects. ?Questions to ask your health care provider ?When should I start prostate cancer screening? ?What is my risk for prostate cancer? ?How often do I need screening? ?What type of screening tests do I need? ?How do I get my test results? ?What do my results mean? ?Do I need treatment? ?Where to find more information ?The American Cancer Society: www.cancer.org ?American Urological Association: www.auanet.org ?Contact a health care provider if: ?You have difficulty urinating. ?You have pain when you urinate or ejaculate. ?You have blood in your urine or semen. ?You have pain in your back or in the area of your prostate. ?Summary ?Prostate cancer is a common type of cancer in men. The prostate gland is located below the bladder and in front of the rectum. This gland adds fluid to semen during ejaculation. ?Prostate cancer screening may identify cancer at an early stage, when the cancer can be treated more easily and is less likely to have spread to other areas of the body. ?The prostate-specific antigen (PSA) test is the recommended screening test for prostate cancer, but it has associated risks. ?Discuss the risks and benefits of prostate cancer screening with your health care provider. If you are age 70 or older, the risks that screening can cause are greater than the benefits that it may provide. ?This information is not intended to replace advice given to you by your health care provider. Make sure you discuss any questions you have with your health care provider. ?Document Revised: 06/10/2021 Document Reviewed: 06/10/2021 ?Elsevier Patient Education ? 2022 [employer]vier [employer]. ? ?

## 2022-03-03 ENCOUNTER — Other Ambulatory Visit: Payer: Self-pay | Admitting: Family Medicine

## 2022-03-04 NOTE — Telephone Encounter (Signed)
Requested Prescriptions  ?Pending Prescriptions Disp Refills  ?? atorvastatin (LIPITOR) 20 MG tablet [Pharmacy Med Name: Atorvastatin Calcium 20 MG Oral Tablet] 90 tablet 0  ?  Sig: Take 1 tablet by mouth once daily  ?  ? Cardiovascular:  Antilipid - Statins Failed - 03/03/2022 12:01 PM  ?  ?  Failed - Lipid Panel in normal range within the last 12 months  ?  Cholesterol, Total  ?Date Value Ref Range Status  ?04/23/2021 167 100 - 199 mg/dL Final  ? ?Cholesterol  ?Date Value Ref Range Status  ?08/08/2021 158 0 - 200 mg/dL Final  ? ?Cholesterol [place], [place]  ?Date Value Ref Range Status  ?10/16/2016 CANCELED    ?  Comment:  ?  Test not performed ? ?Result canceled by the ancillary ?  ? ?LDL Chol Calc (NIH)  ?Date Value Ref Range Status  ?04/23/2021 103 (H) 0 - 99 mg/dL Final  ? ?LDL Cholesterol  ?Date Value Ref Range Status  ?08/08/2021 85 0 - 99 mg/dL Final  ?  Comment:  ?         ?Total Cholesterol/HDL:CHD Risk ?Coronary Heart Disease Risk Table ?                    Men   Women ? 1/2 Average Risk   3.4   3.3 ? Average Risk       5.0   4.4 ? 2 X Average Risk   9.6   7.1 ? 3 X Average Risk  23.4   11.0 ?       ?Use the calculated Patient Ratio ?above and the CHD Risk Table ?to determine the patient's CHD Risk. ?       ?ATP III CLASSIFICATION (LDL): ? <100     mg/dL   Optimal ? 100-129  mg/dL   Near or Above ?                   Optimal ? 130-159  mg/dL   Borderline ? 160-189  mg/dL   High ? >190     mg/dL   Very High ?Performed at [facility], [address] [place], [place] [address] ?  ? ?HDL  ?Date Value Ref Range Status  ?08/08/2021 32 (L) >40 mg/dL Final  ?04/23/2021 42 >39 mg/dL Final  ? ?Triglycerides  ?Date Value Ref Range Status  ?08/08/2021 206 (H) <150 mg/dL Final  ? ?Triglycerides Piccolo,Waived  ?Date Value Ref Range Status  ?10/16/2016 CANCELED    ?  Comment:  ?  Test not performed ? ?Result canceled by the ancillary ?  ? ?  ?  ?  Passed - Patient is not pregnant  ?  ?  Passed - Valid  encounter within last 12 months  ?  Recent Outpatient Visits   ?      ? 4 months ago Bipolar affective disorder, currently depressed, moderate ([place])  ? Stuart P, DO  ? 5 months ago Thyroid nodule  ? [place], Megan P, DO  ? 9 months ago Essential hypertension  ? [place], Megan P, DO  ? 10 months ago Essential hypertension  ? [place], DO  ? 1 year ago Aortic atherosclerosis ([place])  ? [facility] [place], Henrine Screws T, NP  ?  ?  ?Future Appointments   ?        ? In 3 weeks Wynetta Emery, Megan P, DO  [place]  ? In 3 weeks Theora Gianotti, NP [facility]  ? In 1 month  [place]  ?  ? ?  ?  ?  ? ? ?

## 2022-03-28 ENCOUNTER — Ambulatory Visit: Payer: Medicare Other | Admitting: Nurse Practitioner

## 2022-03-28 ENCOUNTER — Ambulatory Visit (INDEPENDENT_AMBULATORY_CARE_PROVIDER_SITE_OTHER): Payer: Medicare Other | Admitting: Family Medicine

## 2022-03-28 ENCOUNTER — Encounter: Payer: Self-pay | Admitting: Family Medicine

## 2022-03-28 ENCOUNTER — Encounter: Payer: Self-pay | Admitting: Nurse Practitioner

## 2022-03-28 VITALS — BP 168/65 | HR 69 | Temp 97.7°F | Wt 175.4 lb

## 2022-03-28 VITALS — BP 170/72 | HR 71 | Ht 63.0 in | Wt 175.0 lb

## 2022-03-28 DIAGNOSIS — N400 Enlarged prostate without lower urinary tract symptoms: Secondary | ICD-10-CM

## 2022-03-28 DIAGNOSIS — Z0000 Encounter for general adult medical examination without abnormal findings: Secondary | ICD-10-CM

## 2022-03-28 DIAGNOSIS — I359 Nonrheumatic aortic valve disorder, unspecified: Secondary | ICD-10-CM

## 2022-03-28 DIAGNOSIS — I129 Hypertensive chronic kidney disease with stage 1 through stage 4 chronic kidney disease, or unspecified chronic kidney disease: Secondary | ICD-10-CM | POA: Diagnosis not present

## 2022-03-28 DIAGNOSIS — I700 Atherosclerosis of aorta: Secondary | ICD-10-CM | POA: Diagnosis not present

## 2022-03-28 DIAGNOSIS — R972 Elevated prostate specific antigen [PSA]: Secondary | ICD-10-CM | POA: Diagnosis not present

## 2022-03-28 DIAGNOSIS — R55 Syncope and collapse: Secondary | ICD-10-CM | POA: Diagnosis not present

## 2022-03-28 DIAGNOSIS — F3170 Bipolar disorder, currently in remission, most recent episode unspecified: Secondary | ICD-10-CM | POA: Diagnosis not present

## 2022-03-28 DIAGNOSIS — Q231 Congenital insufficiency of aortic valve: Secondary | ICD-10-CM | POA: Diagnosis not present

## 2022-03-28 DIAGNOSIS — E782 Mixed hyperlipidemia: Secondary | ICD-10-CM

## 2022-03-28 DIAGNOSIS — E785 Hyperlipidemia, unspecified: Secondary | ICD-10-CM | POA: Diagnosis not present

## 2022-03-28 DIAGNOSIS — I10 Essential (primary) hypertension: Secondary | ICD-10-CM | POA: Diagnosis not present

## 2022-03-28 DIAGNOSIS — N183 Chronic kidney disease, stage 3 unspecified: Secondary | ICD-10-CM

## 2022-03-28 LAB — URINALYSIS, ROUTINE W REFLEX MICROSCOPIC
Bilirubin, UA: NEGATIVE
Glucose, UA: NEGATIVE
Ketones, UA: NEGATIVE
Leukocytes,UA: NEGATIVE
Nitrite, UA: NEGATIVE
Protein,UA: NEGATIVE
RBC, UA: NEGATIVE
Specific Gravity, UA: 1.025 (ref 1.005–1.030)
Urobilinogen, Ur: 1 mg/dL (ref 0.2–1.0)
pH, UA: 5.5 (ref 5.0–7.5)

## 2022-03-28 LAB — MICROALBUMIN, URINE WAIVED
Creatinine, Urine Waived: 200 mg/dL (ref 10–300)
Microalb, Ur Waived: 10 mg/L (ref 0–19)
Microalb/Creat Ratio: <30 mg/g (ref <30)

## 2022-03-28 MED ORDER — DULOXETINE HCL 40 MG PO CPEP: 40.0000 mg | ORAL_CAPSULE | Freq: Every day | ORAL | 1 refills | Status: DC

## 2022-03-28 MED ORDER — ATORVASTATIN CALCIUM 20 MG PO TABS: 20.0000 mg | ORAL_TABLET | Freq: Every day | ORAL | 0 refills | Status: DC

## 2022-03-28 MED ORDER — DIVALPROEX SODIUM ER 250 MG PO TB24: 250.0000 mg | ORAL_TABLET | Freq: Two times a day (BID) | ORAL | 1 refills | Status: DC

## 2022-03-28 MED ORDER — RISPERIDONE 0.5 MG PO TABS: 0.5000 mg | ORAL_TABLET | Freq: Every day | ORAL | 1 refills | Status: DC

## 2022-03-28 MED ORDER — OMEPRAZOLE 40 MG PO CPDR: 40.0000 mg | DELAYED_RELEASE_CAPSULE | Freq: Every day | ORAL | 5 refills | Status: DC

## 2022-03-28 MED ORDER — BUPROPION HCL ER (XL) 300 MG PO TB24: 300.0000 mg | ORAL_TABLET | Freq: Every day | ORAL | 1 refills | Status: DC

## 2022-03-28 MED ORDER — LOSARTAN POTASSIUM 50 MG PO TABS: 50.0000 mg | ORAL_TABLET | Freq: Every day | ORAL | 1 refills | Status: DC

## 2022-03-28 MED ORDER — RISPERIDONE 2 MG PO TABS: 2.0000 mg | ORAL_TABLET | Freq: Every day | ORAL | 1 refills | Status: DC

## 2022-03-28 NOTE — Assessment & Plan Note (Signed)
Did not take his medicine this AM. Encouraged him to take it daily. Continue current regimen. Continue to monitor. Call with any concerns. Labs drawn today. ?

## 2022-03-28 NOTE — Progress Notes (Signed)
? ?BP (!) 168/65   Pulse 69   Temp 97.7 ?F (36.5 ?C)   Wt 175 lb 6.4 oz (79.6 kg)   SpO2 97%   BMI 31.07 kg/m?   ? ?Subjective:  ? ? Patient ID: Dean Larsen, male    DOB: Apr 26, 1946, 76 y.o.   MRN: [id number] ? ?HPI: ?Dean Larsen is a 76 y.o. male presenting on 03/28/2022 for comprehensive medical examination. Current medical complaints include: ? ?HYPERTENSION / HYPERLIPIDEMIA- did not take his medicine this AM ?Satisfied with current treatment? yes ?Duration of hypertension: chronic ?BP monitoring frequency: not checking ?BP medication side effects: no ?Past BP meds: losartan ?Duration of hyperlipidemia: chronic ?Cholesterol medication side effects: no ?Cholesterol supplements: none ?Past cholesterol medications: atorvastatin ?Medication compliance: excellent compliance ?Aspirin: yes ?Recent stressors: no ?Recurrent headaches: no ?Visual changes: no ?Palpitations: no ?Dyspnea: no ?Chest pain: no ?Lower extremity edema: no ?Dizzy/lightheaded: no ? ?BIPOLAR ?Mood status: worse ?Satisfied with current treatment?: no ?Symptom severity: moderate  ?Duration of current treatment : chronic ?Side effects: no ?Medication compliance: good compliance ?Psychotherapy/counseling: no  ?Previous psychiatric medications: cymbalta, risperidone, wellbutrin, depakote ?Depressed mood: yes ?Anxious mood: yes ?Anhedonia: no ?Significant weight loss or gain: no ?Insomnia: no  ?Fatigue: no ?Feelings of worthlessness or guilt: no ?Impaired concentration/indecisiveness: no ?Suicidal ideations: no ?Hopelessness: no ?Crying spells: no ? ?  03/28/2022  ?  8:16 AM 10/28/2021  ?  2:54 PM 09/23/2021  ?  3:23 PM 05/21/2021  ?  9:48 AM 04/23/2021  ? 10:16 AM  ?Depression screen PHQ 2/9  ?Decreased Interest 0 1 3 0 0  ?Down, Depressed, Hopeless 0 0 3 0 0  ?PHQ - 2 Score 0 1 6 0 0  ?Altered sleeping 0 0 3 0 0  ?Tired, decreased energy 0 1 3 0 0  ?Change in appetite 0 0 3 0 0  ?Feeling bad or failure about yourself  0 0 1 0 0  ?Trouble  concentrating 0 0 0 0 0  ?Moving slowly or fidgety/restless 0 0 0 0 0  ?Suicidal thoughts 0 0 0 0 0  ?PHQ-9 Score 0 2 16 0 0  ?Difficult doing work/chores  Not difficult at all Very difficult Not difficult at all Not difficult at all  ? ? ?He currently lives with: wife, daughter and grandchild ?Interim Problems from his last visit: no ? ?Depression Screen done today and results listed below:  ? ?  03/28/2022  ?  8:16 AM 10/28/2021  ?  2:54 PM 09/23/2021  ?  3:23 PM 05/21/2021  ?  9:48 AM 04/23/2021  ? 10:16 AM  ?Depression screen PHQ 2/9  ?Decreased Interest 0 1 3 0 0  ?Down, Depressed, Hopeless 0 0 3 0 0  ?PHQ - 2 Score 0 1 6 0 0  ?Altered sleeping 0 0 3 0 0  ?Tired, decreased energy 0 1 3 0 0  ?Change in appetite 0 0 3 0 0  ?Feeling bad or failure about yourself  0 0 1 0 0  ?Trouble concentrating 0 0 0 0 0  ?Moving slowly or fidgety/restless 0 0 0 0 0  ?Suicidal thoughts 0 0 0 0 0  ?PHQ-9 Score 0 2 16 0 0  ?Difficult doing work/chores  Not difficult at all Very difficult Not difficult at all Not difficult at all  ? ? ? ?Past Medical History:  ?Past Medical History:  ?Diagnosis Date  ? Arthritis   ? Bipolar disorder ([place])   ? CVA (cerebral vascular accident) (  [place])   ? Age 38 - no deficits  ? Diverticulitis of colon   ? GERD (gastroesophageal reflux disease)   ? Hyperlipidemia   ? Iron deficiency anemia   ? ? ?Surgical History:  ?Past Surgical History:  ?Procedure Laterality Date  ? ESOPHAGOGASTRODUODENOSCOPY (EGD) WITH PROPOFOL N/A 05/23/2016  ? Procedure: ESOPHAGOGASTRODUODENOSCOPY (EGD) WITH PROPOFOL with dialation;  Surgeon: Lucilla Lame, MD;  Location: [facility];  Service: Endoscopy;  Laterality: N/A;  ? ESOPHAGOGASTRODUODENOSCOPY (EGD) WITH PROPOFOL N/A 06/14/2019  ? Procedure: ESOPHAGOGASTRODUODENOSCOPY (EGD) WITH PROPOFOL;  Surgeon: Lucilla Lame, MD;  [facility] ENDOSCOPY;  Service: Endoscopy;  Laterality: N/A;  ? HERNIA REPAIR    ? ORTHOPEDIC SURGERY    ? Surgery for broken leg and toe   ? ? ?Medications:  ?Current Outpatient Medications on File Prior to Visit  ?Medication Sig  ? aspirin 81 MG tablet Take 81 mg by mouth daily.  ? ?No current facility-administered medications on file prior to visit.  ? ? ?Allergies:  ?Allergies  ?Allergen Reactions  ? Lisinopril   ? Codeine Rash  ? Penicillin G Benzathine Rash  ? Percocet [Oxycodone-Acetaminophen] Rash  ? ? ?Social History:  ?Social History  ? ?Socioeconomic History  ? Marital status: Married  ?  Spouse name: Not on file  ? Number of children: Not on file  ? Years of education: Not on file  ? Highest education level: Not on file  ?Occupational History  ? Occupation: retired   ?Tobacco Use  ? Smoking status: Former  ? Smokeless tobacco: Current  ?  Types: Snuff  ?Vaping Use  ? Vaping Use: Never used  ?Substance and Sexual Activity  ? Alcohol use: No  ?  Alcohol/week: 0.0 standard drinks  ? Drug use: No  ? Sexual activity: Yes  ?  Birth control/protection: None  ?Other Topics Concern  ? Not on file  ?Social History Narrative  ? Not on file  ? ?Social Determinants of Health  ? ?Financial Resource Strain: Low Risk   ? Difficulty of Paying Living Expenses: Not hard at all  ?Food Insecurity: No Food Insecurity  ? Worried About Charity fundraiser in the Last Year: Never true  ? Ran Out of Food in the Last Year: Never true  ?Transportation Needs: No Transportation Needs  ? Lack of Transportation (Medical): No  ? Lack of Transportation (Non-Medical): No  ?Physical Activity: Inactive  ? Days of Exercise per Week: 0 days  ? Minutes of Exercise per Session: 0 min  ?Stress: No Stress Concern Present  ? Feeling of Stress : Not at all  ?Social Connections: Not on file  ?Intimate Partner Violence: Not on file  ? ?Social History  ? ?Tobacco Use  ?Smoking Status Former  ?Smokeless Tobacco Current  ? Types: Snuff  ? ?Social History  ? ?Substance and Sexual Activity  ?Alcohol Use No  ? Alcohol/week: 0.0 standard drinks  ? ? ?Family History:  ?Family History  ?Problem  Relation Age of Onset  ? Stroke Father   ? Diabetes Brother   ? ? ?Past medical history, surgical history, medications, allergies, family history and social history reviewed with patient today and changes made to appropriate areas of the chart.  ? ?Review of Systems  ?Constitutional: Negative.   ?HENT:  Positive for hearing loss. Negative for congestion, ear discharge, ear pain, nosebleeds, sinus pain, sore throat and tinnitus.   ?Eyes: Negative.   ?Respiratory:  Positive for cough. Negative for hemoptysis, sputum production, shortness of breath,  wheezing and stridor.   ?Cardiovascular: Negative.   ?Gastrointestinal:  Positive for abdominal pain and heartburn. Negative for blood in stool, constipation, diarrhea, melena, nausea and vomiting.  ?Genitourinary: Negative.   ?Musculoskeletal:  Positive for joint pain. Negative for back pain, falls, myalgias and neck pain.  ?Skin: Negative.   ?Neurological: Negative.   ?Endo/Heme/Allergies: Negative.   ?Psychiatric/Behavioral:  Positive for depression. Negative for hallucinations, memory loss, substance abuse and suicidal ideas. The patient is not nervous/anxious and does not have insomnia.   ?All other ROS negative except what is listed above and in the HPI.  ? ?   ?Objective:  ?  ?BP (!) 168/65   Pulse 69   Temp 97.7 ?F (36.5 ?C)   Wt 175 lb 6.4 oz (79.6 kg)   SpO2 97%   BMI 31.07 kg/m?   ?Wt Readings from Last 3 Encounters:  ?03/28/22 175 lb 6.4 oz (79.6 kg)  ?12/04/21 176 lb (79.8 kg)  ?10/28/21 176 lb 6.4 oz (80 kg)  ?  ?Physical Exam ?Vitals and nursing note reviewed.  ?Constitutional:   ?   General: He is not in acute distress. ?   Appearance: Normal appearance. He is obese. He is not ill-appearing, toxic-appearing or diaphoretic.  ?HENT:  ?   Head: Normocephalic and atraumatic.  ?   Right Ear: Tympanic membrane, ear canal and external ear normal. There is no impacted cerumen.  ?   Left Ear: Tympanic membrane, ear canal and external ear normal. There is no  impacted cerumen.  ?   Nose: Nose normal. No congestion or rhinorrhea.  ?   Mouth/Throat:  ?   Mouth: Mucous membranes are moist.  ?   Pharynx: Oropharynx is clear. No oropharyngeal exudate or posterior oropharyngeal erythema.  ?Eyes:  ?
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
## 2022-03-28 NOTE — Patient Instructions (Signed)
Dr. Richardson Landry ?[id number], [place], [place] [address] ?Phone: [phone] ?

## 2022-03-28 NOTE — Assessment & Plan Note (Signed)
Under good control on current regimen. Continue current regimen. Continue to monitor. Call with any concerns. Labs drawn today.  

## 2022-03-28 NOTE — Assessment & Plan Note (Signed)
Not doing great. Will increase his risperidone to 2.'5mg'$  and recheck 1 month. Call with any concerns. Continue other medications.  ?

## 2022-03-28 NOTE — Assessment & Plan Note (Signed)
Under good control on current regimen. Continue current regimen. Continue to monitor. Call with any concerns. Refills given. Labs drawn today.   

## 2022-03-28 NOTE — Patient Instructions (Signed)
Medication Instructions:  ?No changes at this time.  ? ?*If you need a refill on your cardiac medications before your next appointment, please call your pharmacy* ? ? ?Lab Work: ?None ? ?If you have labs (blood work) drawn today and your tests are completely normal, you will receive your results only by: ?MyChart Message (if you have MyChart) OR ?A paper copy in the mail ?If you have any lab test that is abnormal or we need to change your treatment, we will call you to review the results. ? ? ?Testing/Procedures: ?None ? ? ?Follow-Up: ?At [facility], you and your health needs are our priority.  As part of our continuing mission to provide you with exceptional heart care, we have created designated Provider Care Teams.  These Care Teams include your primary Cardiologist (physician) and Advanced Practice Providers (APPs -  Physician Assistants and Nurse Practitioners) who all work together to provide you with the care you need, when you need it. ? ? ?Your next appointment:   ?6 month(s) ? ?The format for your next appointment:   ?In Person ? ?Provider:   ?Kathlyn Sacramento, MD or Murray Hodgkins, NP ?

## 2022-03-28 NOTE — Assessment & Plan Note (Signed)
Will keep his BP and cholesterol under good control. Continue to monitor. Call with any concerns.  

## 2022-03-29 LAB — CBC WITH DIFFERENTIAL/PLATELET
Basophils Absolute: 0 10*3/uL (ref 0.0–0.2)
Basos: 1 %
EOS (ABSOLUTE): 0.1 10*3/uL (ref 0.0–0.4)
Eos: 3 %
Hematocrit: 37.9 % (ref 37.5–51.0)
Hemoglobin: 12.2 g/dL — ABNORMAL LOW (ref 13.0–17.7)
Immature Grans (Abs): 0 10*3/uL (ref 0.0–0.1)
Immature Granulocytes: 0 %
Lymphocytes Absolute: 1.3 10*3/uL (ref 0.7–3.1)
Lymphs: 30 %
MCH: 29.8 pg (ref 26.6–33.0)
MCHC: 32.2 g/dL (ref 31.5–35.7)
MCV: 92 fL (ref 79–97)
Monocytes Absolute: 0.7 10*3/uL (ref 0.1–0.9)
Monocytes: 15 %
Neutrophils Absolute: 2.2 10*3/uL (ref 1.4–7.0)
Neutrophils: 51 %
Platelets: 179 10*3/uL (ref 150–450)
RBC: 4.1 x10E6/uL — ABNORMAL LOW (ref 4.14–5.80)
RDW: 13.8 % (ref 11.6–15.4)
WBC: 4.4 10*3/uL (ref 3.4–10.8)

## 2022-03-29 LAB — COMPREHENSIVE METABOLIC PANEL
ALT: 10 IU/L (ref 0–44)
AST: 19 IU/L (ref 0–40)
Albumin/Globulin Ratio: 1.8 (ref 1.2–2.2)
Albumin: 4.2 g/dL (ref 3.7–4.7)
Alkaline Phosphatase: 34 IU/L — ABNORMAL LOW (ref 44–121)
BUN/Creatinine Ratio: 16 (ref 10–24)
BUN: 19 mg/dL (ref 8–27)
Bilirubin Total: 0.5 mg/dL (ref 0.0–1.2)
CO2: 25 mmol/L (ref 20–29)
Calcium: 9.4 mg/dL (ref 8.6–10.2)
Chloride: 103 mmol/L (ref 96–106)
Creatinine, Ser: 1.19 mg/dL (ref 0.76–1.27)
Globulin, Total: 2.3 g/dL (ref 1.5–4.5)
Glucose: 110 mg/dL — ABNORMAL HIGH (ref 70–99)
Potassium: 4.3 mmol/L (ref 3.5–5.2)
Sodium: 143 mmol/L (ref 134–144)
Total Protein: 6.5 g/dL (ref 6.0–8.5)
eGFR: 63 mL/min/{1.73_m2} (ref >59)

## 2022-03-29 LAB — LIPID PANEL W/O CHOL/HDL RATIO
Cholesterol, Total: 163 mg/dL (ref 100–199)
HDL: 47 mg/dL (ref >39)
LDL Chol Calc (NIH): 93 mg/dL (ref 0–99)
Triglycerides: 130 mg/dL (ref 0–149)
VLDL Cholesterol Cal: 23 mg/dL (ref 5–40)

## 2022-03-29 LAB — VALPROIC ACID LEVEL: Valproic Acid Lvl: 60 ug/mL (ref 50–100)

## 2022-03-29 LAB — PSA: Prostate Specific Ag, Serum: 14.2 ng/mL — ABNORMAL HIGH (ref 0.0–4.0)

## 2022-03-29 LAB — TSH: TSH: 2.54 u[IU]/mL (ref 0.450–4.500)

## 2022-04-07 ENCOUNTER — Ambulatory Visit (INDEPENDENT_AMBULATORY_CARE_PROVIDER_SITE_OTHER): Payer: Medicare Other | Admitting: Physician Assistant

## 2022-04-07 ENCOUNTER — Encounter: Payer: Self-pay | Admitting: Physician Assistant

## 2022-04-07 ENCOUNTER — Ambulatory Visit
Admission: RE | Admit: 2022-04-07 | Discharge: 2022-04-07 | Disposition: A | Discharge status: Home / Self Care | Payer: Medicare Other | Source: Ambulatory Visit | Attending: Physician Assistant | Admitting: Physician Assistant

## 2022-04-07 VITALS — BP 162/71 | HR 69 | Temp 98.0°F | Wt 179.2 lb

## 2022-04-07 DIAGNOSIS — R1031 Right lower quadrant pain: Secondary | ICD-10-CM | POA: Insufficient documentation

## 2022-04-07 DIAGNOSIS — R109 Unspecified abdominal pain: Secondary | ICD-10-CM | POA: Diagnosis not present

## 2022-04-07 MED ORDER — IOHEXOL 300 MG/ML  SOLN
100.0000 mL | Freq: Once | INTRAMUSCULAR | Status: AC | PRN
  Administered 2022-04-07: 100 mL via INTRAVENOUS

## 2022-04-07 NOTE — Patient Instructions (Addendum)
If you notice any of the following please go to the ED: fever, low blood pressure, confusion, increased pain, constipation, nausea, diarrhea.  ? ?I am ordering a CT scan to make rule out a problem with your appendix and the hernia you stated you have in the right inguinal canal  ? ? A call from the referral team should occur today to help schedule this imaging.  ?We will keep you updated on the results ?You can take Tylenol as needed for pain (do not exceed '3000mg'$  of Tylenol per day) ? ?It was nice to meet you and I appreciate the opportunity to be involved in your care ? ? ?

## 2022-04-07 NOTE — Assessment & Plan Note (Addendum)
Acute, new problem ?Began 3 days ago and worsened today ?Positive McBurneys point tenderness and positive Psoas, positive obturator signs  ?Patient does not appear toxic on exam, afebrile today ?Patient has history of diverticulitis so this is on differential as well. ?Recommend using Tylenol for pain as needed as most recent GFR was 63  ?Concerned for potential incarcerated inguinal hernia but cannot completely rule out appendicitis or diverticulitis on PE ?CT abdomen and pelvis ordered today to examine appendix and hernias as well as to rule out diverticulitis  ?Results to dictate further management ?Reviewed ED precautions with patient and family ?General surgery referral placed as well for follow up evaluation of hernia should it be needed ? ?

## 2022-04-07 NOTE — Progress Notes (Signed)
? ? ?  ?    Acute Office Visit ? ? ?Patient: Dean Larsen   DOB: 1946/03/22   76 y.o. Male  MRN: [id number] ?Visit Date: 04/07/2022 ? ?Today's healthcare provider: Dani Gobble Katyana Trolinger, PA-C  ?Introduced myself to the patient as a Journalist, newspaper and provided education on APPs in clinical practice.  ? ? ?CC: Right sided abdominal pain, right sided low back pain  ? ?Subjective  ?  ?HPI ?HPI   ? ? Abdominal Pain   ? Additional comments: Umbilical region radiating to R flank. Ongoing x few days per patient. Denies N/V/D. Pain radiates down R lower extremity.  ? ?  ?  ?Last edited by Olene Craven, Carolynn Serve, CMA on 04/07/2022 11:29 AM.  ?  ?  ? ?Patient states he has pain in the RLQ that radiates to the right lower back and then down his right leg ? ?Started a few days ago but became worse this AM ?States he had a hard time getting out of bed today ?Denies injuries to the area  ?States he has 3 hernias - bilateral inguinal and "one in the stomach" ?States pain is about 9/10 sharp   ? ?He has a history of hernia surgery in the 80s - unsure of location  ?He has not taken his BP medications today  ? ?Medications: ?Outpatient Medications Prior to Visit  ?Medication Sig  ? aspirin 81 MG tablet Take 81 mg by mouth daily.  ? atorvastatin (LIPITOR) 20 MG tablet Take 1 tablet (20 mg total) by mouth daily.  ? buPROPion (WELLBUTRIN XL) 300 MG 24 hr tablet Take 1 tablet (300 mg total) by mouth daily.  ? divalproex (DEPAKOTE ER) 250 MG 24 hr tablet Take 1 tablet (250 mg total) by mouth 2 (two) times daily.  ? DULoxetine HCl 40 MG CPEP Take 40 mg by mouth daily.  ? losartan (COZAAR) 50 MG tablet Take 1 tablet (50 mg total) by mouth daily.  ? omeprazole (PRILOSEC) 40 MG capsule Take 1 capsule (40 mg total) by mouth daily.  ? risperiDONE (RISPERDAL) 0.5 MG tablet Take 1 tablet (0.5 mg total) by mouth at bedtime. To be taken with the '2mg'$  for 2.'5mg'$  total  ? risperiDONE (RISPERDAL) 2 MG tablet Take 1 tablet (2 mg total) by mouth daily. To be taken with  the 0.'5mg'$  for 2.'5mg'$  total  ? ?No facility-administered medications prior to visit.  ? ? ?Review of Systems  ?Constitutional:  Negative for chills, diaphoresis and fever.  ?Gastrointestinal:  Positive for abdominal pain. Negative for constipation, diarrhea, nausea and vomiting.  ?Genitourinary:  Negative for penile pain, penile swelling, scrotal swelling and testicular pain.  ?Musculoskeletal:  Positive for back pain.  ?Neurological:  Positive for headaches (Chronic headaches). Negative for dizziness, syncope and light-headedness.  ? ? ?  Objective  ?  ?BP (!) 162/71 (BP Location: Right Arm)   Pulse 69   Temp 98 ?F (36.7 ?C) (Oral)   Wt 179 lb 3.2 oz (81.3 kg)   SpO2 97%   BMI 31.74 kg/m?  ? ? ?Physical Exam ?Vitals reviewed.  ?Constitutional:   ?   General: He is awake.  ?   Appearance: Normal appearance. He is well-developed and well-groomed. He is obese.  ?HENT:  ?   Head: Normocephalic and atraumatic.  ?Cardiovascular:  ?   Rate and Rhythm: Normal rate and regular rhythm.  ?   Pulses: Normal pulses.     ?     Radial pulses are 2+ on  the right side and 2+ on the left side.  ?   Heart sounds: Murmur heard.  ?Systolic murmur is present with a grade of 2/6.  ?Pulmonary:  ?   Effort: Pulmonary effort is normal.  ?   Breath sounds: Normal breath sounds and air entry.  ?Abdominal:  ?   General: Abdomen is protuberant. Bowel sounds are normal.  ?   Palpations: Abdomen is soft.  ?   Tenderness: There is abdominal tenderness in the right lower quadrant. Positive signs include McBurney's sign, psoas sign and obturator sign.  ?   Comments: Unable to palpate hernia in the right inguinal crease  ?Abdomen is distended but soft on exam   ?Neurological:  ?   Mental Status: He is alert.  ?Psychiatric:     ?   Attention and Perception: Attention normal.     ?   Mood and Affect: Mood and affect normal.     ?   Speech: Speech normal.     ?   Behavior: Behavior normal. Behavior is cooperative.  ?  ? ? ?No results found for any  visits on 04/07/22. ? Assessment & Plan  ?  ? ? ?Problem List Items Addressed This Visit   ? ?  ? Other  ? Right lower quadrant abdominal pain - Primary  ?  Acute, new problem ?Began 3 days ago and worsened today ?Positive McBurneys point tenderness and positive Psoas, positive obturator signs  ?Patient does not appear toxic on exam, afebrile today ?Patient has history of diverticulitis so this is on differential as well. ?Recommend using Tylenol for pain as needed as most recent GFR was 63  ?Concerned for potential incarcerated inguinal hernia but cannot completely rule out appendicitis or diverticulitis on PE ?CT abdomen and pelvis ordered today to examine appendix and hernias as well as to rule out diverticulitis  ?Results to dictate further management ?Reviewed ED precautions with patient and family ?General surgery referral placed as well for follow up evaluation of hernia should it be needed ? ?  ?  ? Relevant Orders  ? Ambulatory referral to General Surgery  ? CT Abdomen Pelvis W Contrast  ? ? ? ?No follow-ups on file. ? ? ?I, Cord Wilczynski E Doreena Maulden, PA-C, have reviewed all documentation for this visit. The documentation on 04/07/22 for the exam, diagnosis, procedures, and orders are all accurate and complete. ? ?Lacey Wallman, MHS, PA-C ?[facility] ?[facility]  ? ?   ? ? ? ? ?

## 2022-04-07 NOTE — Addendum Note (Signed)
Addended by: Talitha Givens on: 04/07/2022 05:09 PM ? ? Modules accepted: Orders ? ?

## 2022-04-10 ENCOUNTER — Other Ambulatory Visit: Payer: Medicare Other

## 2022-04-10 DIAGNOSIS — R1031 Right lower quadrant pain: Secondary | ICD-10-CM | POA: Diagnosis not present

## 2022-04-10 LAB — URINALYSIS, ROUTINE W REFLEX MICROSCOPIC
Bilirubin, UA: NEGATIVE
Glucose, UA: NEGATIVE
Ketones, UA: NEGATIVE
Leukocytes,UA: NEGATIVE
Nitrite, UA: NEGATIVE
Protein,UA: NEGATIVE
RBC, UA: NEGATIVE
Specific Gravity, UA: 1.02 (ref 1.005–1.030)
Urobilinogen, Ur: 1 mg/dL (ref 0.2–1.0)
pH, UA: 7 (ref 5.0–7.5)

## 2022-04-11 LAB — COMPREHENSIVE METABOLIC PANEL
ALT: 8 IU/L (ref 0–44)
AST: 16 IU/L (ref 0–40)
Albumin/Globulin Ratio: 2 (ref 1.2–2.2)
Albumin: 4.4 g/dL (ref 3.7–4.7)
Alkaline Phosphatase: 34 IU/L — ABNORMAL LOW (ref 44–121)
BUN/Creatinine Ratio: 13 (ref 10–24)
BUN: 19 mg/dL (ref 8–27)
Bilirubin Total: 0.7 mg/dL (ref 0.0–1.2)
CO2: 28 mmol/L (ref 20–29)
Calcium: 9.3 mg/dL (ref 8.6–10.2)
Chloride: 103 mmol/L (ref 96–106)
Creatinine, Ser: 1.48 mg/dL — ABNORMAL HIGH (ref 0.76–1.27)
Globulin, Total: 2.2 g/dL (ref 1.5–4.5)
Glucose: 90 mg/dL (ref 70–99)
Potassium: 4.6 mmol/L (ref 3.5–5.2)
Sodium: 142 mmol/L (ref 134–144)
Total Protein: 6.6 g/dL (ref 6.0–8.5)
eGFR: 49 mL/min/{1.73_m2} — ABNORMAL LOW (ref >59)

## 2022-04-11 LAB — CBC WITH DIFFERENTIAL/PLATELET
Basophils Absolute: 0 10*3/uL (ref 0.0–0.2)
Basos: 1 %
EOS (ABSOLUTE): 0.1 10*3/uL (ref 0.0–0.4)
Eos: 3 %
Hematocrit: 35.5 % — ABNORMAL LOW (ref 37.5–51.0)
Hemoglobin: 11.7 g/dL — ABNORMAL LOW (ref 13.0–17.7)
Immature Grans (Abs): 0 10*3/uL (ref 0.0–0.1)
Immature Granulocytes: 1 %
Lymphocytes Absolute: 1.4 10*3/uL (ref 0.7–3.1)
Lymphs: 33 %
MCH: 30.6 pg (ref 26.6–33.0)
MCHC: 33 g/dL (ref 31.5–35.7)
MCV: 93 fL (ref 79–97)
Monocytes Absolute: 0.6 10*3/uL (ref 0.1–0.9)
Monocytes: 14 %
Neutrophils Absolute: 2.1 10*3/uL (ref 1.4–7.0)
Neutrophils: 48 %
Platelets: 184 10*3/uL (ref 150–450)
RBC: 3.82 x10E6/uL — ABNORMAL LOW (ref 4.14–5.80)
RDW: 13.7 % (ref 11.6–15.4)
WBC: 4.3 10*3/uL (ref 3.4–10.8)

## 2022-04-18 ENCOUNTER — Ambulatory Visit: Payer: Medicare Other
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
## 2022-04-21 ENCOUNTER — Ambulatory Visit: Payer: Medicare Other | Admitting: Surgery

## 2022-04-21 ENCOUNTER — Encounter: Payer: Self-pay | Admitting: Surgery

## 2022-04-21 VITALS — BP 158/74 | HR 82 | Temp 98.3°F | Ht 63.0 in | Wt 179.0 lb

## 2022-04-21 DIAGNOSIS — R1031 Right lower quadrant pain: Secondary | ICD-10-CM

## 2022-04-21 NOTE — Patient Instructions (Addendum)
Avoid strenuous activity for example: it is recommended that you not lift, push or pull anything over 20 pounds.  ? ?Try a high fiber diet. You can get Benefiber or Metamucil over-the-counter.  ? ?If you have any concerns or questions, please feel free to call our office. Follow up as needed.  ? ?Abdominal Pain, Adult ?Many things can cause belly (abdominal) pain. Most times, belly pain is not dangerous. Many cases of belly pain can be watched and treated at home. Sometimes, though, belly pain is serious. Your doctor will try to find the cause of your belly pain. ?Follow these instructions at home: ? ?Medicines ?Take over-the-counter and prescription medicines only as told by your doctor. ?Do not take medicines that help you poop (laxatives) unless told by your doctor. ?General instructions ?Watch your belly pain for any changes. ?Drink enough fluid to keep your pee (urine) pale yellow. ?Keep all follow-up visits as told by your doctor. This is important. ?Contact a doctor if: ?Your belly pain changes or gets worse. ?You are not hungry, or you lose weight without trying. ?You are having trouble pooping (constipated) or have watery poop (diarrhea) for more than 2-3 days. ?You have pain when you pee or poop. ?Your belly pain wakes you up at night. ?Your pain gets worse with meals, after eating, or with certain foods. ?You are vomiting and cannot keep anything down. ?You have a fever. ?You have blood in your pee. ?Get help right away if: ?Your pain does not go away as soon as your doctor says it should. ?You cannot stop vomiting. ?Your pain is only in areas of your belly, such as the right side or the left lower part of the belly. ?You have bloody or black poop, or poop that looks like tar. ?You have very bad pain, cramping, or bloating in your belly. ?You have signs of not having enough fluid or water in your body (dehydration), such as: ?Dark pee, very little pee, or no pee. ?Cracked lips. ?Dry mouth. ?Sunken  eyes. ?Sleepiness. ?Weakness. ?You have trouble breathing or chest pain. ?Summary ?Many cases of belly pain can be watched and treated at home. ?Watch your belly pain for any changes. ?Take over-the-counter and prescription medicines only as told by your doctor. ?Contact a doctor if your belly pain changes or gets worse. ?Get help right away if you have very bad pain, cramping, or bloating in your belly. ?This information is not intended to replace advice given to you by your health care provider. Make sure you discuss any questions you have with your health care provider. ?Document Revised: 04/25/2019 Document Reviewed: 04/25/2019 ?Elsevier Patient Education ? [place]. ? ?

## 2022-04-21 NOTE — Progress Notes (Signed)
?04/21/2022 ? ?Reason for Visit: Right lower quadrant abdominal pain ? ?Requesting Provider:  Talitha Givens, PA-C ? ?History of Present Illness: ?Dean Larsen is a 76 y.o. male presenting for evaluation of right lower quadrant pain.  The patient presented to his PCP on 04/07/22 with RLQ pain radiating to his back and right upper thigh.  There was concern for appendicitis and he was sent to the ER for further evaluation.  The patient reports that at that point, he was having pain for about 2-3 days, in high intensity/severity.  Denied any chest pain, shortness of breath, nausea, or vomiting.  In the ER he had a CT scan of abdomen and pelvis which showed a normal appendix, without evidence of bowel obstruction, and did not show a right groin hernia, but showed a possible left groin hernia with fluid.  The patient reports that his pain lasted for another 2-3 days and has since resolved.  He denies any new pain since the pain resolved.  However, when the pain was present, he was having difficulty walking due to the pain. ? ?The patient and his wife report that he's been told in the past that he has three hernias, one in his upper abdomen, and in both groin areas, and they are confused as to the true diagnosis.  He does endorse a bulging or tenting area in the upper abdomen.  He also reports having a prior open left inguinal hernia repair many years ago but has not noted any bulging in the groin areas.  He's currently asymptomatic. ? ?Past Medical History: ?Past Medical History:  ?Diagnosis Date  ? Aortic insufficiency   ? a. 08/2020 Echo: EF 50-55%, bicuspid AoV, mild-mod AI; b. 07/2021 Echo: EF 55-60%, no rwma, basal-septal hypertrophy, GrI DD, nl RV fxn, mild MR/AS, bicuspid AoV, mild-mod AI. Asc Ao 102mm.  ? Arthritis   ? Bicuspid aortic valve   ? Bipolar disorder ([place])   ? CVA (cerebral vascular accident) [facility])   ? Age 76 - no deficits  ? Dilated ascending aorta ([place])   ? a. 07/2021 Echo: As Ao 77mm.  ? Diverticulitis of  colon   ? GERD (gastroesophageal reflux disease)   ? History of cardiovascular stress test   ? a. 08/2020 MV: EF 61%, no ischemia/scar. Mild coronary artery and AoV Ca2+ noted.  ? Hyperlipidemia   ? Iron deficiency anemia   ?  ? ?Past Surgical History: ?Past Surgical History:  ?Procedure Laterality Date  ? ESOPHAGOGASTRODUODENOSCOPY (EGD) WITH PROPOFOL N/A 05/23/2016  ? Procedure: ESOPHAGOGASTRODUODENOSCOPY (EGD) WITH PROPOFOL with dialation;  Surgeon: Lucilla Lame, MD;  Location: [facility];  Service: Endoscopy;  Laterality: N/A;  ? ESOPHAGOGASTRODUODENOSCOPY (EGD) WITH PROPOFOL N/A 06/14/2019  ? Procedure: ESOPHAGOGASTRODUODENOSCOPY (EGD) WITH PROPOFOL;  Surgeon: Lucilla Lame, MD;  Location: [facility] ENDOSCOPY;  Service: Endoscopy;  Laterality: N/A;  ? HERNIA REPAIR    ? ORTHOPEDIC SURGERY    ? Surgery for broken leg and toe  ? ? ?Home Medications: ?Prior to Admission medications   ?Medication Sig Start Date End Date Taking? Authorizing Provider  ?aspirin 81 MG tablet Take 81 mg by mouth daily.   Yes [provider]  ?atorvastatin (LIPITOR) 20 MG tablet Take 1 tablet (20 mg total) by mouth daily. 03/28/22  Yes Johnson, Megan P, DO  ?buPROPion (WELLBUTRIN XL) 300 MG 24 hr tablet Take 1 tablet (300 mg total) by mouth daily. 03/28/22  Yes Johnson, Megan P, DO  ?divalproex (DEPAKOTE ER) 250 MG 24 hr tablet Take  1 tablet (250 mg total) by mouth 2 (two) times daily. 03/28/22  Yes Johnson, Megan P, DO  ?DULoxetine HCl 40 MG CPEP Take 40 mg by mouth daily. 03/28/22  Yes Johnson, Megan P, DO  ?losartan (COZAAR) 50 MG tablet Take 1 tablet (50 mg total) by mouth daily. 03/28/22  Yes Johnson, Megan P, DO  ?omeprazole (PRILOSEC) 40 MG capsule Take 1 capsule (40 mg total) by mouth daily. 03/28/22  Yes Johnson, Megan P, DO  ?risperiDONE (RISPERDAL) 0.5 MG tablet Take 1 tablet (0.5 mg total) by mouth at bedtime. To be taken with the $Remove'2mg'zgyfhXa$  for 2.$Remo'5mg'SxLBz$  total 03/28/22  Yes Johnson, Megan P, DO  ?risperiDONE (RISPERDAL) 2 MG tablet  Take 1 tablet (2 mg total) by mouth daily. To be taken with the 0.$RemoveBe'5mg'QVxlEAMlO$  for 2.$Remo'5mg'JYLPH$  total 03/28/22  Yes Park Liter P, DO  ? ? ?Allergies: ?Allergies  ?Allergen Reactions  ? Lisinopril   ? Codeine Rash  ? Penicillin G Benzathine Rash  ? Percocet [Oxycodone-Acetaminophen] Rash  ? ? ?Social History: ? reports that he has quit smoking. His smokeless tobacco use includes snuff. He reports that he does not drink alcohol and does not use drugs.  ? ?Family History: ?Family History  ?Problem Relation Age of Onset  ? Stroke Father   ? Diabetes Brother   ? ? ?Review of Systems: ?Review of Systems  ?Constitutional:  Negative for chills and fever.  ?Respiratory:  Negative for shortness of breath.   ?Cardiovascular:  Negative for chest pain.  ?Gastrointestinal:  Positive for abdominal pain. Negative for constipation, diarrhea, nausea and vomiting.  ?Genitourinary:  Negative for dysuria.  ?Musculoskeletal:  Positive for back pain and myalgias.  ?Skin:  Negative for rash.  ? ?Physical Exam ?BP (!) 158/74   Pulse 82   Temp 98.3 ?F (36.8 ?C) (Oral)   Ht $R'5\' 3"'zK$  (1.6 m)   Wt 179 lb (81.2 kg)   SpO2 94%   BMI 31.71 kg/m?  ?CONSTITUTIONAL: No acute distress, well nourished. ?HEENT:  Normocephalic, atraumatic, extraocular motion intact. ?NECK: Trachea is midline, and there is no jugular venous distension.  ?RESPIRATORY:  Lungs are clear, and breath sounds are equal bilaterally. Normal respiratory effort without pathologic use of accessory muscles. ?CARDIOVASCULAR: Regular rhythm and rate, has systolic murmur. ?GI: The abdomen is soft, non-distended, currently non-tender to palpation.  The patient has a small area of diastasis recti in the upper abdomen, about 3 cm width.  There is no evidence of umbilical hernia.  There's also no evidence of right inguinal hernia nor left inguinal hernia.  ?MUSCULOSKELETAL:  Normal muscle strength and tone in all four extremities.  No peripheral edema or cyanosis. ?SKIN: Skin turgor is normal. There  are no pathologic skin lesions.  ?NEUROLOGIC:  Motor and sensation is grossly normal.  Cranial nerves are grossly intact. ?PSYCH:  Alert and oriented to person, place and time. Affect is normal. ? ?Laboratory Analysis: ?Labs from 04/10/22: ?Na 142, K 4.6, Cl 103, CO2 28, BUN 19, Cr 1.48.  Total bili 0.7, AST 16, ALT 8, Alk Phos 34, Albumin 4.4.  WBC 4.3, Hgb 11.7, Hct 35.5, Plt 184.  U/A negative. ? ?Imaging: ?CT scan abdomen/pelvis on 04/07/22: ?IMPRESSION: ?1. No acute findings in the abdomen or pelvis. ?2. Normal appendix.  No evidence to suggest acute appendicitis. ?3. Small left inguinal hernia containing fluid. ?4. Colonic diverticulosis without evidence of acute diverticulitis ? ?Assessment and Plan: ?This is a 76 y.o. male with a history of right groin pain radiating to  his back and upper thigh, now resolved. ? ?--The patient is currently asymptomatic and reports no pain issues since his symptoms resolved a couple days after his ER visit.  On exam, there's no evidence of a right inguinal hernia, and there's no evidence of appendicitis on his CT scan.  There is concern for a left inguinal hernia containing fluid on CT, but there's no clinical evidence for this on exam, and likely the fluid represents a seroma that developed after his open left inguinal hernia repair.  There's also no evidence of an umbilical hernia and he only has a small area of diastasis recti.   ?--At this point, I do not have a clear source for his pain.  Discussed with the patient that perhaps this could be related to back pain and pinched nerve, but at least for now, there's no surgical need. ?--Discussed with him that if this were a hernia, precautions would involve avoiding heavy lifting/pushing, avoiding strenuous activity.   ?--With regards to the diverticulosis found on CT scan, there's no diverticulitis present.  Recommended high fiber diet and avoiding constipation . ?--Follow up as needed. ? ?I spent 40 minutes dedicated to the  care of this patient on the date of this encounter to include pre-visit review of records, face-to-face time with the patient discussing diagnosis and management, and any post-visit coordination of care. ? ? ?J

## 2022-04-28 ENCOUNTER — Encounter: Payer: Self-pay | Admitting: Family Medicine

## 2022-04-28 ENCOUNTER — Ambulatory Visit (INDEPENDENT_AMBULATORY_CARE_PROVIDER_SITE_OTHER): Payer: Medicare Other | Admitting: Family Medicine

## 2022-04-28 VITALS — BP 156/70 | HR 69 | Temp 97.8°F | Ht 62.99 in | Wt 178.4 lb

## 2022-04-28 DIAGNOSIS — N183 Chronic kidney disease, stage 3 unspecified: Secondary | ICD-10-CM

## 2022-04-28 DIAGNOSIS — F3132 Bipolar disorder, current episode depressed, moderate: Secondary | ICD-10-CM

## 2022-04-28 DIAGNOSIS — M545 Low back pain, unspecified: Secondary | ICD-10-CM

## 2022-04-28 DIAGNOSIS — G8929 Other chronic pain: Secondary | ICD-10-CM | POA: Diagnosis not present

## 2022-04-28 DIAGNOSIS — I129 Hypertensive chronic kidney disease with stage 1 through stage 4 chronic kidney disease, or unspecified chronic kidney disease: Secondary | ICD-10-CM | POA: Diagnosis not present

## 2022-04-28 MED ORDER — LOSARTAN POTASSIUM 50 MG PO TABS: 100.0000 mg | ORAL_TABLET | Freq: Every day | ORAL | 1 refills | Status: DC

## 2022-04-28 NOTE — Assessment & Plan Note (Signed)
Did much better when he was on his medicine- but then he stopped it. Restart it and recheck 2 weeks.  ?

## 2022-04-28 NOTE — Progress Notes (Signed)
? ?BP (!) 156/70   Pulse 69   Temp 97.8 ?F (36.6 ?C) (Oral)   Ht 5' 2.99" (1.6 m)   Wt 178 lb 6.4 oz (80.9 kg)   SpO2 99%   BMI 31.61 kg/m?   ? ?Subjective:  ? ? Patient ID: Dean Larsen, male    DOB: 1946/08/07, 76 y.o.   MRN: [id number] ? ?HPI: ?Dean Larsen is a 76 y.o. male ? ?Chief Complaint  ?Patient presents with  ? Diverticulosis  ?  New DX, RLQ pain has gotten better since last visit.   ? Bipolar affective disorder  ?  Medication was increased at last visit.  ?  ? ?Diagnosed with diverticulosis about 1 month ago. Saw general surgery who was unsure if his pain was due to hernia, diverticulosis or back pain. He notes that it's into his R groin and back. His pain had resolved, but now is back mildly. Nothing is really changing it. He has not done anything for it.  ? ?BIPOLAR- stopped his risperidone on 4/10. Thought it was causing his pain. He was doing much better when he was on the higher dose ?Mood status: stable ?Satisfied with current treatment?: no ?Symptom severity: moderate  ?Duration of current treatment :  has stopped his medicine  ?Side effects: no ?Medication compliance: fair compliance ?Psychotherapy/counseling: no  ?Previous psychiatric medications: risperidone, cymbalta, depakote, wellbutrin ?Depressed mood: no ?Anxious mood: no ?Anhedonia: no ?Significant weight loss or gain: no ?Insomnia: no  ?Fatigue: no ?Feelings of worthlessness or guilt: no ?Impaired concentration/indecisiveness: no ?Suicidal ideations: no ?Hopelessness: no ?Crying spells: no ? ?  04/28/2022  ? 10:32 AM 04/07/2022  ? 11:34 AM 03/28/2022  ?  8:16 AM 10/28/2021  ?  2:54 PM 09/23/2021  ?  3:23 PM  ?Depression screen PHQ 2/9  ?Decreased Interest 0 0 0 1 3  ?Down, Depressed, Hopeless 0 0 0 0 3  ?PHQ - 2 Score 0 0 0 1 6  ?Altered sleeping 0 0 0 0 3  ?Tired, decreased energy 0 0 0 1 3  ?Change in appetite 0 0 0 0 3  ?Feeling bad or failure about yourself  0 0 0 0 1  ?Trouble concentrating 0 0 0 0 0  ?Moving slowly or  fidgety/restless 0 0 0 0 0  ?Suicidal thoughts 0 0 0 0 0  ?PHQ-9 Score 0 0 0 2 16  ?Difficult doing work/chores Not difficult at all Not difficult at all  Not difficult at all Very difficult  ? ? ?  04/28/2022  ? 10:33 AM 04/07/2022  ? 11:34 AM 03/28/2022  ?  8:17 AM 10/28/2021  ?  2:53 PM  ?GAD 7 : Generalized Anxiety Score  ?Nervous, Anxious, on Edge 0 0 0 2  ?Control/stop worrying 0 0 0 0  ?Worry too much - different things 0 0 0 0  ?Trouble relaxing 0 0 0 0  ?Restless 0 0 0 0  ?Easily annoyed or irritable 0 0 0 1  ?Afraid - awful might happen 0 0 0 0  ?Total GAD 7 Score 0 0 0 3  ?Anxiety Difficulty  Not difficult at all  Not difficult at all  ? ?HYPERTENSION ?Hypertension status: uncontrolled  ?Satisfied with current treatment? no ?Duration of hypertension: chronic ?BP monitoring frequency:  not checking ?BP medication side effects:  no ?Medication compliance: excellent compliance ?Previous BP meds: losartan ?Aspirin: no ?Recurrent headaches: no ?Visual changes: no ?Palpitations: no ?Dyspnea: no ?Chest pain: no ?Lower extremity edema: no ?  Dizzy/lightheaded: no ? ?Relevant past medical, surgical, family and social history reviewed and updated as indicated. Interim medical history since our last visit reviewed. ?Allergies and medications reviewed and updated. ? ?Review of Systems  ?Constitutional: Negative.   ?Respiratory: Negative.    ?Cardiovascular: Negative.   ?Gastrointestinal:  Positive for abdominal pain. Negative for abdominal distention, anal bleeding, blood in stool, constipation, diarrhea, nausea, rectal pain and vomiting.  ?Musculoskeletal:  Positive for back pain. Negative for arthralgias, gait problem, joint swelling, myalgias, neck pain and neck stiffness.  ?Skin: Negative.   ?Psychiatric/Behavioral:  Negative for agitation, behavioral problems, confusion, decreased concentration, dysphoric mood, hallucinations, self-injury, sleep disturbance and suicidal ideas. The patient is not nervous/anxious and is  not hyperactive.   ? ?Per HPI unless specifically indicated above ? ?   ?Objective:  ?  ?BP (!) 156/70   Pulse 69   Temp 97.8 ?F (36.6 ?C) (Oral)   Ht 5' 2.99" (1.6 m)   Wt 178 lb 6.4 oz (80.9 kg)   SpO2 99%   BMI 31.61 kg/m?   ?Wt Readings from Last 3 Encounters:  ?04/28/22 178 lb 6.4 oz (80.9 kg)  ?04/21/22 179 lb (81.2 kg)  ?04/07/22 179 lb 3.2 oz (81.3 kg)  ?  ?Physical Exam ?Vitals and nursing note reviewed.  ?Constitutional:   ?   General: He is not in acute distress. ?   Appearance: Normal appearance. He is not ill-appearing, toxic-appearing or diaphoretic.  ?HENT:  ?   Head: Normocephalic and atraumatic.  ?   Right Ear: External ear normal.  ?   Left Ear: External ear normal.  ?   Nose: Nose normal.  ?   Mouth/Throat:  ?   Mouth: Mucous membranes are moist.  ?   Pharynx: Oropharynx is clear.  ?Eyes:  ?   General: No scleral icterus.    ?   Right eye: No discharge.     ?   Left eye: No discharge.  ?   Extraocular Movements: Extraocular movements intact.  ?   Conjunctiva/sclera: Conjunctivae normal.  ?   Pupils: Pupils are equal, round, and reactive to light.  ?Cardiovascular:  ?   Rate and Rhythm: Normal rate and regular rhythm.  ?   Pulses: Normal pulses.  ?   Heart sounds: Normal heart sounds. No murmur heard. ?  No friction rub. No gallop.  ?Pulmonary:  ?   Effort: Pulmonary effort is normal. No respiratory distress.  ?   Breath sounds: Normal breath sounds. No stridor. No wheezing, rhonchi or rales.  ?Chest:  ?   Chest wall: No tenderness.  ?Musculoskeletal:     ?   General: Normal range of motion.  ?   Cervical back: Normal range of motion and neck supple.  ?Skin: ?   General: Skin is warm and dry.  ?   Capillary Refill: Capillary refill takes less than 2 seconds.  ?   Coloration: Skin is not jaundiced or pale.  ?   Findings: No bruising, erythema, lesion or rash.  ?Neurological:  ?   General: No focal deficit present.  ?   Mental Status: He is alert and oriented to person, place, and time. Mental  status is at baseline.  ?Psychiatric:     ?   Mood and Affect: Mood normal.     ?   Behavior: Behavior normal.     ?   Thought Content: Thought content normal.     ?   Judgment: Judgment normal.  ? ? ?  Results for orders placed or performed in visit on 04/07/22  ?CBC w/Diff  ?Result Value Ref Range  ? WBC 4.3 3.4 - 10.8 x10E3/uL  ? RBC 3.82 (L) 4.14 - 5.80 x10E6/uL  ? Hemoglobin 11.7 (L) 13.0 - 17.7 g/dL  ? Hematocrit 35.5 (L) 37.5 - 51.0 %  ? MCV 93 79 - 97 fL  ? MCH 30.6 26.6 - 33.0 pg  ? MCHC 33.0 31.5 - 35.7 g/dL  ? RDW 13.7 11.6 - 15.4 %  ? Platelets 184 150 - 450 x10E3/uL  ? Neutrophils 48 Not Estab. %  ? Lymphs 33 Not Estab. %  ? Monocytes 14 Not Estab. %  ? Eos 3 Not Estab. %  ? Basos 1 Not Estab. %  ? Neutrophils Absolute 2.1 1.4 - 7.0 x10E3/uL  ? Lymphocytes Absolute 1.4 0.7 - 3.1 x10E3/uL  ? Monocytes Absolute 0.6 0.1 - 0.9 x10E3/uL  ? EOS (ABSOLUTE) 0.1 0.0 - 0.4 x10E3/uL  ? Basophils Absolute 0.0 0.0 - 0.2 x10E3/uL  ? Immature Granulocytes 1 Not Estab. %  ? Immature Grans (Abs) 0.0 0.0 - 0.1 x10E3/uL  ?Urinalysis, Routine w reflex microscopic  ?Result Value Ref Range  ? Specific Gravity, UA 1.020 1.005 - 1.030  ? pH, UA 7.0 5.0 - 7.5  ? Color, UA Yellow Yellow  ? Appearance Ur Clear Clear  ? Leukocytes,UA Negative Negative  ? Protein,UA Negative Negative/Trace  ? Glucose, UA Negative Negative  ? Ketones, UA Negative Negative  ? RBC, UA Negative Negative  ? Bilirubin, UA Negative Negative  ? Urobilinogen, Ur 1.0 0.2 - 1.0 mg/dL  ? Nitrite, UA Negative Negative  ?Comp Met (CMET)  ?Result Value Ref Range  ? Glucose 90 70 - 99 mg/dL  ? BUN 19 8 - 27 mg/dL  ? Creatinine, Ser 1.48 (H) 0.76 - 1.27 mg/dL  ? eGFR 49 (L) >59 mL/min/1.73  ? BUN/Creatinine Ratio 13 10 - 24  ? Sodium 142 134 - 144 mmol/L  ? Potassium 4.6 3.5 - 5.2 mmol/L  ? Chloride 103 96 - 106 mmol/L  ? CO2 28 20 - 29 mmol/L  ? Calcium 9.3 8.6 - 10.2 mg/dL  ? Total Protein 6.6 6.0 - 8.5 g/dL  ? Albumin 4.4 3.7 - 4.7 g/dL  ? Globulin, Total 2.2 1.5  - 4.5 g/dL  ? Albumin/Globulin Ratio 2.0 1.2 - 2.2  ? Bilirubin Total 0.7 0.0 - 1.2 mg/dL  ? Alkaline Phosphatase 34 (L) 44 - 121 IU/L  ? AST 16 0 - 40 IU/L  ? ALT 8 0 - 44 IU/L  ? ?   ?Assessment & Plan:  ? ?Problem

## 2022-04-28 NOTE — Assessment & Plan Note (Signed)
Running high. Will increase his losartan from '75mg'$  to '100mg'$  and recheck 2 weeks. Call with any concerns. Continue to monitor.  ?

## 2022-04-28 NOTE — Patient Instructions (Signed)
Take 2.'5mg'$  risperidone,  ?Take '100mg'$  losartan (2 pills) ?See me in 2 weeks ?

## 2022-04-29 ENCOUNTER — Telehealth: Payer: Self-pay

## 2022-04-29 NOTE — Telephone Encounter (Signed)
PA for Duloxetine 40 mg capsules initiated and submitted via Cover My Meds. Key: [id number] ?Preferred alternatives are Duloxetine 20 BID, Escitalopram, or Venlafaxine.  ?

## 2022-04-30 MED ORDER — DULOXETINE HCL 20 MG PO CPEP: 40.0000 mg | ORAL_CAPSULE | Freq: Every day | ORAL | 1 refills | Status: DC

## 2022-04-30 NOTE — Telephone Encounter (Signed)
PA was denied. Can we change to one of the alternatives listed in the previous message? ?

## 2022-05-04 ENCOUNTER — Other Ambulatory Visit: Payer: Self-pay | Admitting: Family Medicine

## 2022-05-04 DIAGNOSIS — E785 Hyperlipidemia, unspecified: Secondary | ICD-10-CM

## 2022-05-05 NOTE — Telephone Encounter (Signed)
Requested medication (s) are due for refill today - no ? ?Requested medication (s) are on the active medication list -no ? ?Future visit scheduled -yes ? ?Last refill: unknown ? ?Notes to clinic: no longer on current medication list ? ?Requested Prescriptions  ?Pending Prescriptions Disp Refills  ? Choline Fenofibrate (FENOFIBRIC ACID) 135 MG CPDR [Pharmacy Med Name: Fenofibric Acid 135 MG Oral Capsule Delayed Release] 90 capsule 0  ?  Sig: Take 1 capsule by mouth once daily  ?  ? Cardiovascular:  Antilipid - Fibric Acid Derivatives Failed - 05/04/2022  1:15 PM  ?  ?  Failed - Cr in normal range and within 360 days  ?  Creatinine, Ser  ?Date Value Ref Range Status  ?04/10/2022 1.48 (H) 0.76 - 1.27 mg/dL Final  ?  ?  ?  ?  Failed - HGB in normal range and within 360 days  ?  Hemoglobin  ?Date Value Ref Range Status  ?04/10/2022 11.7 (L) 13.0 - 17.7 g/dL Final  ?  ?  ?  ?  Failed - HCT in normal range and within 360 days  ?  Hematocrit  ?Date Value Ref Range Status  ?04/10/2022 35.5 (L) 37.5 - 51.0 % Final  ?  ?  ?  ?  Failed - Lipid Panel in normal range within the last 12 months  ?  Cholesterol, Total  ?Date Value Ref Range Status  ?03/28/2022 163 100 - 199 mg/dL Final  ? ?Cholesterol Piccolo, [place]  ?Date Value Ref Range Status  ?10/16/2016 CANCELED    ?  Comment:  ?  Test not performed ? ?Result canceled by the ancillary ?  ? ?LDL Chol Calc (NIH)  ?Date Value Ref Range Status  ?03/28/2022 93 0 - 99 mg/dL Final  ? ?HDL  ?Date Value Ref Range Status  ?03/28/2022 47 >39 mg/dL Final  ? ?Triglycerides  ?Date Value Ref Range Status  ?03/28/2022 130 0 - 149 mg/dL Final  ? ?Triglycerides Piccolo,Waived  ?Date Value Ref Range Status  ?10/16/2016 CANCELED    ?  Comment:  ?  Test not performed ? ?Result canceled by the ancillary ?  ? ?  ?  ?  Passed - ALT in normal range and within 360 days  ?  ALT  ?Date Value Ref Range Status  ?04/10/2022 8 0 - 44 IU/L Final  ? ?ALT (SGPT) Piccolo, Waived  ?Date Value Ref Range Status   ?10/16/2016 26 10 - 47 U/L Final  ?  ?  ?  ?  Passed - AST in normal range and within 360 days  ?  AST  ?Date Value Ref Range Status  ?04/10/2022 16 0 - 40 IU/L Final  ? ?AST (SGOT) Piccolo, Waived  ?Date Value Ref Range Status  ?10/16/2016 23 11 - 38 U/L Final  ?  ?  ?  ?  Passed - PLT in normal range and within 360 days  ?  Platelets  ?Date Value Ref Range Status  ?04/10/2022 184 150 - 450 x10E3/uL Final  ?  ?  ?  ?  Passed - WBC in normal range and within 360 days  ?  WBC  ?Date Value Ref Range Status  ?04/10/2022 4.3 3.4 - 10.8 x10E3/uL Final  ?08/07/2021 3.7 (L) 4.0 - 10.5 K/uL Final  ?  ?  ?  ?  Passed - eGFR is 30 or above and within 360 days  ?  GFR calc Af Amer  ?Date Value Ref Range Status  ?10/15/2020 57 (  L) >59 mL/min/1.73 Final  ?  Comment:  ?  **In accordance with recommendations from the NKF-ASN Task force,** ?  [facility] is in the process of updating its eGFR calculation to the ?  2021 CKD-EPI creatinine equation that estimates kidney function ?  without a race variable. ?  ? ?GFR, Estimated  ?Date Value Ref Range Status  ?08/08/2021 54 (L) >60 mL/min Final  ?  Comment:  ?  (NOTE) ?Calculated using the CKD-EPI Creatinine Equation (2021) ?  ? ?eGFR  ?Date Value Ref Range Status  ?04/10/2022 49 (L) >59 mL/min/1.73 Final  ?  ?  ?  ?  Passed - Valid encounter within last 12 months  ?  Recent Outpatient Visits   ? ?      ? 1 week ago Chronic right-sided low back pain without sciatica  ? [place] P, DO  ? 4 weeks ago Right lower quadrant abdominal pain  ? [place], PA-C  ? 1 month ago Routine general medical examination at a health care facility  ? [place] P, DO  ? 6 months ago Bipolar affective disorder, currently depressed, moderate ([place])  ? [place] P, DO  ? 7 months ago Thyroid nodule  ? [place], [place] P, DO  ? ?  ?  ?Future Appointments   ? ?        ? In 1  week Wynetta Emery, Barb Merino, DO [place]  ? In 4 months Theora Gianotti, NP [facility], [facility]  ? ?  ? ? ?  ?  ?  ? ? ? ?Requested Prescriptions  ?Pending Prescriptions Disp Refills  ? Choline Fenofibrate (FENOFIBRIC ACID) 135 MG CPDR [Pharmacy Med Name: Fenofibric Acid 135 MG Oral Capsule Delayed Release] 90 capsule 0  ?  Sig: Take 1 capsule by mouth once daily  ?  ? Cardiovascular:  Antilipid - Fibric Acid Derivatives Failed - 05/04/2022  1:15 PM  ?  ?  Failed - Cr in normal range and within 360 days  ?  Creatinine, Ser  ?Date Value Ref Range Status  ?04/10/2022 1.48 (H) 0.76 - 1.27 mg/dL Final  ?  ?  ?  ?  Failed - HGB in normal range and within 360 days  ?  Hemoglobin  ?Date Value Ref Range Status  ?04/10/2022 11.7 (L) 13.0 - 17.7 g/dL Final  ?  ?  ?  ?  Failed - HCT in normal range and within 360 days  ?  Hematocrit  ?Date Value Ref Range Status  ?04/10/2022 35.5 (L) 37.5 - 51.0 % Final  ?  ?  ?  ?  Failed - Lipid Panel in normal range within the last 12 months  ?  Cholesterol, Total  ?Date Value Ref Range Status  ?03/28/2022 163 100 - 199 mg/dL Final  ? ?Cholesterol Piccolo, [place]  ?Date Value Ref Range Status  ?10/16/2016 CANCELED    ?  Comment:  ?  Test not performed ? ?Result canceled by the ancillary ?  ? ?LDL Chol Calc (NIH)  ?Date Value Ref Range Status  ?03/28/2022 93 0 - 99 mg/dL Final  ? ?HDL  ?Date Value Ref Range Status  ?03/28/2022 47 >39 mg/dL Final  ? ?Triglycerides  ?Date Value Ref Range Status  ?03/28/2022 130 0 - 149 mg/dL Final  ? ?Triglycerides Piccolo,Waived  ?Date Value Ref Range Status  ?10/16/2016 CANCELED    ?  Comment:  ?  Test not performed ? ?Result canceled by the ancillary ?  ? ?  ?  ?  Passed - ALT in normal range and within 360 days  ?  ALT  ?Date Value Ref Range Status  ?04/10/2022 8 0 - 44 IU/L Final  ? ?ALT (SGPT) Piccolo, Waived  ?Date Value Ref Range Status  ?10/16/2016 26 10 - 47 U/L Final  ?  ?  ?  ?  Passed - AST in normal  range and within 360 days  ?  AST  ?Date Value Ref Range Status  ?04/10/2022 16 0 - 40 IU/L Final  ? ?AST (SGOT) Piccolo, Waived  ?Date Value Ref Range Status  ?10/16/2016 23 11 - 38 U/L Final  ?  ?  ?  ?  Passed - PLT in normal range and within 360 days  ?  Platelets  ?Date Value Ref Range Status  ?04/10/2022 184 150 - 450 x10E3/uL Final  ?  ?  ?  ?  Passed - WBC in normal range and within 360 days  ?  WBC  ?Date Value Ref Range Status  ?04/10/2022 4.3 3.4 - 10.8 x10E3/uL Final  ?08/07/2021 3.7 (L) 4.0 - 10.5 K/uL Final  ?  ?  ?  ?  Passed - eGFR is 30 or above and within 360 days  ?  GFR calc Af Amer  ?Date Value Ref Range Status  ?10/15/2020 57 (L) >59 mL/min/1.73 Final  ?  Comment:  ?  **In accordance with recommendations from the NKF-ASN Task force,** ?  [facility] is in the process of updating its eGFR calculation to the ?  2021 CKD-EPI creatinine equation that estimates kidney function ?  without a race variable. ?  ? ?GFR, Estimated  ?Date Value Ref Range Status  ?08/08/2021 54 (L) >60 mL/min Final  ?  Comment:  ?  (NOTE) ?Calculated using the CKD-EPI Creatinine Equation (2021) ?  ? ?eGFR  ?Date Value Ref Range Status  ?04/10/2022 49 (L) >59 mL/min/1.73 Final  ?  ?  ?  ?  Passed - Valid encounter within last 12 months  ?  Recent Outpatient Visits   ? ?      ? 1 week ago Chronic right-sided low back pain without sciatica  ? [place] P, DO  ? 4 weeks ago Right lower quadrant abdominal pain  ? [place], PA-C  ? 1 month ago Routine general medical examination at a health care facility  ? [place] P, DO  ? 6 months ago Bipolar affective disorder, currently depressed, moderate ([place])  ? [place] P, DO  ? 7 months ago Thyroid nodule  ? [place], [place] P, DO  ? ?  ?  ?Future Appointments   ? ?        ? In 1 week Wynetta Emery, Barb Merino, DO [place]  ? In 4  months Theora Gianotti, NP [facility], [facility]  ? ?  ? ? ?  ?  ?  ? ? ? ?

## 2022-05-12 ENCOUNTER — Encounter: Payer: Self-pay | Admitting: Family Medicine

## 2022-05-12 ENCOUNTER — Ambulatory Visit (INDEPENDENT_AMBULATORY_CARE_PROVIDER_SITE_OTHER): Payer: Medicare Other | Admitting: Family Medicine

## 2022-05-12 VITALS — BP 122/66 | HR 74 | Temp 98.1°F | Wt 181.0 lb

## 2022-05-12 DIAGNOSIS — G8929 Other chronic pain: Secondary | ICD-10-CM | POA: Diagnosis not present

## 2022-05-12 DIAGNOSIS — N183 Hypertensive chronic kidney disease with stage 1 through stage 4 chronic kidney disease, or unspecified chronic kidney disease: Secondary | ICD-10-CM

## 2022-05-12 DIAGNOSIS — F3132 Bipolar disorder, current episode depressed, moderate: Secondary | ICD-10-CM | POA: Diagnosis not present

## 2022-05-12 DIAGNOSIS — M545 Low back pain, unspecified: Secondary | ICD-10-CM | POA: Diagnosis not present

## 2022-05-12 DIAGNOSIS — I129 Hypertensive chronic kidney disease with stage 1 through stage 4 chronic kidney disease, or unspecified chronic kidney disease: Secondary | ICD-10-CM | POA: Diagnosis not present

## 2022-05-12 NOTE — Progress Notes (Signed)
? ?BP 122/66   Pulse 74   Temp 98.1 ?F (36.7 ?C)   Wt 181 lb (82.1 kg)   SpO2 98%   BMI 32.07 kg/m?   ? ?Subjective:  ? ? Patient ID: Dean Larsen, male    DOB: 1946-05-27, 76 y.o.   MRN: [id number] ? ?HPI: ?Dean Larsen is a 76 y.o. male ? ?Chief Complaint  ?Patient presents with  ? Hypertension  ? Bipolar affective disorder  ?  Patient has only been taking 20 mg of the Cymbalta, did not increase to 40 mg.  ? ?HYPERTENSION ?Hypertension status: controlled  ?Satisfied with current treatment? yes ?Duration of hypertension: chronic ?BP monitoring frequency:  not checking ?BP medication side effects:  no ?Medication compliance: excellent compliance ?Previous BP meds:losartan ?Aspirin: no ?Recurrent headaches: no ?Visual changes: no ?Palpitations: no ?Dyspnea: no ?Chest pain: no ?Lower extremity edema: no ?Dizzy/lightheaded: no ? ?BIPOLAR ?Mood status: stable ?Satisfied with current treatment?: yes ?Symptom severity: moderate  ?Duration of current treatment : chronic ?Side effects: no ?Medication compliance: excellent compliance ?Psychotherapy/counseling: no  ?Previous psychiatric medications: risperidone, cymbalta, wellbutrin ?Depressed mood: no ?Anxious mood: no ?Anhedonia: no ?Significant weight loss or gain: no ?Insomnia: no  ?Fatigue: yes ?Feelings of worthlessness or guilt: no ?Impaired concentration/indecisiveness: no ?Suicidal ideations: no ?Hopelessness: no ?Crying spells: no ? ?  05/12/2022  ? 10:15 AM 04/28/2022  ? 10:32 AM 04/07/2022  ? 11:34 AM 03/28/2022  ?  8:16 AM 10/28/2021  ?  2:54 PM  ?Depression screen PHQ 2/9  ?Decreased Interest 0 0 0 0 1  ?Down, Depressed, Hopeless 0 0 0 0 0  ?PHQ - 2 Score 0 0 0 0 1  ?Altered sleeping 0 0 0 0 0  ?Tired, decreased energy 0 0 0 0 1  ?Change in appetite 0 0 0 0 0  ?Feeling bad or failure about yourself  0 0 0 0 0  ?Trouble concentrating 0 0 0 0 0  ?Moving slowly or fidgety/restless 0 0 0 0 0  ?Suicidal thoughts 0 0 0 0 0  ?PHQ-9 Score 0 0 0 0 2  ?Difficult  doing work/chores Not difficult at all Not difficult at all Not difficult at all  Not difficult at all  ? ? ?Relevant past medical, surgical, family and social history reviewed and updated as indicated. Interim medical history since our last visit reviewed. ?Allergies and medications reviewed and updated. ? ?Review of Systems  ?Constitutional: Negative.   ?Respiratory: Negative.    ?Cardiovascular: Negative.   ?Gastrointestinal: Negative.   ?Musculoskeletal:  Positive for back pain and myalgias. Negative for arthralgias, gait problem, joint swelling, neck pain and neck stiffness.  ?Skin: Negative.   ?Neurological: Negative.   ?Psychiatric/Behavioral: Negative.    ? ?Per HPI unless specifically indicated above ? ?   ?Objective:  ?  ?BP 122/66   Pulse 74   Temp 98.1 ?F (36.7 ?C)   Wt 181 lb (82.1 kg)   SpO2 98%   BMI 32.07 kg/m?   ?Wt Readings from Last 3 Encounters:  ?05/12/22 181 lb (82.1 kg)  ?04/28/22 178 lb 6.4 oz (80.9 kg)  ?04/21/22 179 lb (81.2 kg)  ?  ?Physical Exam ?Vitals and nursing note reviewed.  ?Constitutional:   ?   General: He is not in acute distress. ?   Appearance: Normal appearance. He is not ill-appearing, toxic-appearing or diaphoretic.  ?HENT:  ?   Head: Normocephalic and atraumatic.  ?   Right Ear: External ear normal.  ?  Left Ear: External ear normal.  ?   Nose: Nose normal.  ?   Mouth/Throat:  ?   Mouth: Mucous membranes are moist.  ?   Pharynx: Oropharynx is clear.  ?Eyes:  ?   General: No scleral icterus.    ?   Right eye: No discharge.     ?   Left eye: No discharge.  ?   Extraocular Movements: Extraocular movements intact.  ?   Conjunctiva/sclera: Conjunctivae normal.  ?   Pupils: Pupils are equal, round, and reactive to light.  ?Cardiovascular:  ?   Rate and Rhythm: Normal rate and regular rhythm.  ?   Pulses: Normal pulses.  ?   Heart sounds: Normal heart sounds. No murmur heard. ?  No friction rub. No gallop.  ?Pulmonary:  ?   Effort: Pulmonary effort is normal. No respiratory  distress.  ?   Breath sounds: Normal breath sounds. No stridor. No wheezing, rhonchi or rales.  ?Chest:  ?   Chest wall: No tenderness.  ?Musculoskeletal:     ?   General: Normal range of motion.  ?   Cervical back: Normal range of motion and neck supple.  ?Skin: ?   General: Skin is warm and dry.  ?   Capillary Refill: Capillary refill takes less than 2 seconds.  ?   Coloration: Skin is not jaundiced or pale.  ?   Findings: No bruising, erythema, lesion or rash.  ?Neurological:  ?   General: No focal deficit present.  ?   Mental Status: He is alert and oriented to person, place, and time. Mental status is at baseline.  ?Psychiatric:     ?   Mood and Affect: Mood normal.     ?   Behavior: Behavior normal.     ?   Thought Content: Thought content normal.     ?   Judgment: Judgment normal.  ? ? ?Results for orders placed or performed in visit on 04/07/22  ?CBC w/Diff  ?Result Value Ref Range  ? WBC 4.3 3.4 - 10.8 x10E3/uL  ? RBC 3.82 (L) 4.14 - 5.80 x10E6/uL  ? Hemoglobin 11.7 (L) 13.0 - 17.7 g/dL  ? Hematocrit 35.5 (L) 37.5 - 51.0 %  ? MCV 93 79 - 97 fL  ? MCH 30.6 26.6 - 33.0 pg  ? MCHC 33.0 31.5 - 35.7 g/dL  ? RDW 13.7 11.6 - 15.4 %  ? Platelets 184 150 - 450 x10E3/uL  ? Neutrophils 48 Not Estab. %  ? Lymphs 33 Not Estab. %  ? Monocytes 14 Not Estab. %  ? Eos 3 Not Estab. %  ? Basos 1 Not Estab. %  ? Neutrophils Absolute 2.1 1.4 - 7.0 x10E3/uL  ? Lymphocytes Absolute 1.4 0.7 - 3.1 x10E3/uL  ? Monocytes Absolute 0.6 0.1 - 0.9 x10E3/uL  ? EOS (ABSOLUTE) 0.1 0.0 - 0.4 x10E3/uL  ? Basophils Absolute 0.0 0.0 - 0.2 x10E3/uL  ? Immature Granulocytes 1 Not Estab. %  ? Immature Grans (Abs) 0.0 0.0 - 0.1 x10E3/uL  ?Urinalysis, Routine w reflex microscopic  ?Result Value Ref Range  ? Specific Gravity, UA 1.020 1.005 - 1.030  ? pH, UA 7.0 5.0 - 7.5  ? Color, UA Yellow Yellow  ? Appearance Ur Clear Clear  ? Leukocytes,UA Negative Negative  ? Protein,UA Negative Negative/Trace  ? Glucose, UA Negative Negative  ? Ketones, UA  Negative Negative  ? RBC, UA Negative Negative  ? Bilirubin, UA Negative Negative  ? Urobilinogen, Ur 1.0  0.2 - 1.0 mg/dL  ? Nitrite, UA Negative Negative  ?Comp Met (CMET)  ?Result Value Ref Range  ? Glucose 90 70 - 99 mg/dL  ? BUN 19 8 - 27 mg/dL  ? Creatinine, Ser 1.48 (H) 0.76 - 1.27 mg/dL  ? eGFR 49 (L) >59 mL/min/1.73  ? BUN/Creatinine Ratio 13 10 - 24  ? Sodium 142 134 - 144 mmol/L  ? Potassium 4.6 3.5 - 5.2 mmol/L  ? Chloride 103 96 - 106 mmol/L  ? CO2 28 20 - 29 mmol/L  ? Calcium 9.3 8.6 - 10.2 mg/dL  ? Total Protein 6.6 6.0 - 8.5 g/dL  ? Albumin 4.4 3.7 - 4.7 g/dL  ? Globulin, Total 2.2 1.5 - 4.5 g/dL  ? Albumin/Globulin Ratio 2.0 1.2 - 2.2  ? Bilirubin Total 0.7 0.0 - 1.2 mg/dL  ? Alkaline Phosphatase 34 (L) 44 - 121 IU/L  ? AST 16 0 - 40 IU/L  ? ALT 8 0 - 44 IU/L  ? ?   ?Assessment & Plan:  ? ?Problem List Items Addressed This Visit   ? ?  ? Genitourinary  ? Hypertensive kidney disease with CKD stage III ([place]) - Primary  ?  Under good control on current regimen. Continue current regimen. Continue to monitor. Call with any concerns. Refills given. Labs drawn today.  ? ? ?  ?  ? Relevant Orders  ? Basic metabolic panel  ?  ? Other  ? Bipolar disorder ([place])  ?  Did not increase his cymbalta. Will consider. Call with any concerns.  ? ?  ?  ? ?Other Visit Diagnoses   ? ? Chronic right-sided low back pain without sciatica      ? Offered PT vs increase in cymbalta. He will consider.   ? ?  ?  ? ?Follow up plan: ?Return in about 4 months (around 09/12/2022). ? ? ? ? ? ?

## 2022-05-12 NOTE — Assessment & Plan Note (Signed)
Under good control on current regimen. Continue current regimen. Continue to monitor. Call with any concerns. Refills given. Labs drawn today.   

## 2022-05-12 NOTE — Assessment & Plan Note (Signed)
Did not increase his cymbalta. Will consider. Call with any concerns.  ?

## 2022-05-13 LAB — BASIC METABOLIC PANEL
BUN/Creatinine Ratio: 15 (ref 10–24)
BUN: 18 mg/dL (ref 8–27)
CO2: 25 mmol/L (ref 20–29)
Calcium: 9.1 mg/dL (ref 8.6–10.2)
Chloride: 103 mmol/L (ref 96–106)
Creatinine, Ser: 1.19 mg/dL (ref 0.76–1.27)
Glucose: 93 mg/dL (ref 70–99)
Potassium: 4.2 mmol/L (ref 3.5–5.2)
Sodium: 142 mmol/L (ref 134–144)
eGFR: 63 mL/min/{1.73_m2} (ref >59)

## 2022-07-03 ENCOUNTER — Ambulatory Visit: Payer: Medicare Other

## 2022-08-18 ENCOUNTER — Encounter: Payer: Self-pay | Admitting: Family Medicine

## 2022-08-28 ENCOUNTER — Other Ambulatory Visit: Payer: Self-pay | Admitting: Family Medicine

## 2022-08-29 NOTE — Telephone Encounter (Signed)
Requested medication (s) are due for refill today - no  Requested medication (s) are on the active medication list -yes  Future visit scheduled -yes  Last refill: 03/28/22 #90 1RF  Notes to clinic: non delegated Rx  Requested Prescriptions  Pending Prescriptions Disp Refills   risperiDONE (RISPERDAL) 0.5 MG tablet [Pharmacy Med Name: risperiDONE 0.5 MG Oral Tablet] 90 tablet 0    Sig: TAKE 1 TABLET BY MOUTH AT BEDTIME. TO BE TAKEN WITH THE $Remove'2MG'QlqHxxj$  FOR 2.$Remo'5MG'PNVOj$  TOTAL     Not Delegated - Psychiatry:  Antipsychotics - Second Generation (Atypical) - risperidone Failed - 08/28/2022  5:45 PM      Failed - This refill cannot be delegated      Failed - Lipid Panel in normal range within the last 12 months    Cholesterol, Total  Date Value Ref Range Status  03/28/2022 163 100 - 199 mg/dL Final   Cholesterol Piccolo, Waived  Date Value Ref Range Status  10/16/2016 CANCELED      Comment:    Test not performed  Result canceled by the ancillary    LDL Chol Calc (NIH)  Date Value Ref Range Status  03/28/2022 93 0 - 99 mg/dL Final   HDL  Date Value Ref Range Status  03/28/2022 47 >39 mg/dL Final   Triglycerides  Date Value Ref Range Status  03/28/2022 130 0 - 149 mg/dL Final   Triglycerides Piccolo,Waived  Date Value Ref Range Status  10/16/2016 CANCELED      Comment:    Test not performed  Result canceled by the ancillary          Passed - TSH in normal range and within 360 days    TSH  Date Value Ref Range Status  03/28/2022 2.540 0.450 - 4.500 uIU/mL Final         Passed - Completed PHQ-2 or PHQ-9 in the last 360 days      Passed - Last BP in normal range    BP Readings from Last 1 Encounters:  05/12/22 122/66         Passed - Last Heart Rate in normal range    Pulse Readings from Last 1 Encounters:  05/12/22 74         Passed - Valid encounter within last 6 months    Recent Outpatient Visits           3 months ago Hypertensive kidney disease with stage 3  chronic kidney disease, unspecified whether stage 3a or 3b CKD ([place])   [facility] Sawyer, Megan P, DO   4 months ago Chronic right-sided low back pain without sciatica   [employer], Megan P, DO   4 months ago Right lower quadrant abdominal pain   [facility] Mecum, Dani Gobble, PA-C   5 months ago Routine general medical examination at a health care facility   [facility], [place] P, DO   10 months ago Bipolar affective disorder, currently depressed, moderate ([place])   [place], [place], DO       Future Appointments             In 2 weeks Wynetta Emery, Barb Merino, DO [employer], [place]   In 3 weeks Sharolyn Douglas, Clance Boll, NP [facility] [place]            Passed - CBC within normal limits and completed in the last 12  months    WBC  Date Value Ref Range Status  04/10/2022 4.3 3.4 - 10.8 x10E3/uL Final  08/07/2021 3.7 (L) 4.0 - 10.5 K/uL Final   RBC  Date Value Ref Range Status  04/10/2022 3.82 (L) 4.14 - 5.80 x10E6/uL Final  08/07/2021 4.12 (L) 4.22 - 5.81 MIL/uL Final   Hemoglobin  Date Value Ref Range Status  04/10/2022 11.7 (L) 13.0 - 17.7 g/dL Final   Hematocrit  Date Value Ref Range Status  04/10/2022 35.5 (L) 37.5 - 51.0 % Final   MCHC  Date Value Ref Range Status  04/10/2022 33.0 31.5 - 35.7 g/dL Final  08/07/2021 33.6 30.0 - 36.0 g/dL Final   [facility]  Date Value Ref Range Status  04/10/2022 30.6 26.6 - 33.0 pg Final  08/07/2021 31.6 26.0 - 34.0 pg Final   MCV  Date Value Ref Range Status  04/10/2022 93 79 - 97 fL Final   No results found for: "PLTCOUNTKUC", "LABPLAT", "POCPLA" RDW  Date Value Ref Range Status  04/10/2022 13.7 11.6 - 15.4 % Final         Passed - CMP within normal limits and completed in the last 12 months    Albumin  Date Value Ref Range Status  04/10/2022 4.4 3.7 - 4.7 g/dL Final   Alkaline  Phosphatase  Date Value Ref Range Status  04/10/2022 34 (L) 44 - 121 IU/L Final   ALT  Date Value Ref Range Status  04/10/2022 8 0 - 44 IU/L Final   ALT (SGPT) Piccolo, Waived  Date Value Ref Range Status  10/16/2016 26 10 - 47 U/L Final   AST  Date Value Ref Range Status  04/10/2022 16 0 - 40 IU/L Final   AST (SGOT) Piccolo, Waived  Date Value Ref Range Status  10/16/2016 23 11 - 38 U/L Final   BUN  Date Value Ref Range Status  05/12/2022 18 8 - 27 mg/dL Final   Calcium  Date Value Ref Range Status  05/12/2022 9.1 8.6 - 10.2 mg/dL Final   CO2  Date Value Ref Range Status  05/12/2022 25 20 - 29 mmol/L Final   Creatinine, Ser  Date Value Ref Range Status  05/12/2022 1.19 0.76 - 1.27 mg/dL Final   Glucose  Date Value Ref Range Status  05/12/2022 93 70 - 99 mg/dL Final   Glucose, Bld  Date Value Ref Range Status  08/08/2021 117 (H) 70 - 99 mg/dL Final    Comment:    Glucose reference range applies only to samples taken after fasting for at least 8 hours.   Glucose-Capillary  Date Value Ref Range Status  08/08/2021 115 (H) 70 - 99 mg/dL Final    Comment:    Glucose reference range applies only to samples taken after fasting for at least 8 hours.   Potassium  Date Value Ref Range Status  05/12/2022 4.2 3.5 - 5.2 mmol/L Final   Sodium  Date Value Ref Range Status  05/12/2022 142 134 - 144 mmol/L Final   Bilirubin Total  Date Value Ref Range Status  04/10/2022 0.7 0.0 - 1.2 mg/dL Final   Protein, ur  Date Value Ref Range Status  08/07/2021 NEGATIVE NEGATIVE mg/dL Final   Protein,UA  Date Value Ref Range Status  04/10/2022 Negative Negative/Trace Final   Total Protein  Date Value Ref Range Status  04/10/2022 6.6 6.0 - 8.5 g/dL Final   GFR calc Af Amer  Date Value Ref Range Status  10/15/2020 57 (L) >59 mL/min/1.73  Final    Comment:    **In accordance with recommendations from the NKF-ASN Task force,**   [facility] is in the process of updating its  eGFR calculation to the   2021 CKD-EPI creatinine equation that estimates kidney function   without a race variable.    eGFR  Date Value Ref Range Status  05/12/2022 63 >59 mL/min/1.73 Final   GFR, Estimated  Date Value Ref Range Status  08/08/2021 54 (L) >60 mL/min Final    Comment:    (NOTE) Calculated using the CKD-EPI Creatinine Equation (2021)             Requested Prescriptions  Pending Prescriptions Disp Refills   risperiDONE (RISPERDAL) 0.5 MG tablet [Pharmacy Med Name: risperiDONE 0.5 MG Oral Tablet] 90 tablet 0    Sig: TAKE 1 TABLET BY MOUTH AT BEDTIME. TO BE TAKEN WITH THE $Remove'2MG'hnPNzhr$  FOR 2.$Remo'5MG'gRlMs$  TOTAL     Not Delegated - Psychiatry:  Antipsychotics - Second Generation (Atypical) - risperidone Failed - 08/28/2022  5:45 PM      Failed - This refill cannot be delegated      Failed - Lipid Panel in normal range within the last 12 months    Cholesterol, Total  Date Value Ref Range Status  03/28/2022 163 100 - 199 mg/dL Final   Cholesterol Piccolo, Waived  Date Value Ref Range Status  10/16/2016 CANCELED      Comment:    Test not performed  Result canceled by the ancillary    LDL Chol Calc (NIH)  Date Value Ref Range Status  03/28/2022 93 0 - 99 mg/dL Final   HDL  Date Value Ref Range Status  03/28/2022 47 >39 mg/dL Final   Triglycerides  Date Value Ref Range Status  03/28/2022 130 0 - 149 mg/dL Final   Triglycerides Piccolo,Waived  Date Value Ref Range Status  10/16/2016 CANCELED      Comment:    Test not performed  Result canceled by the ancillary          Passed - TSH in normal range and within 360 days    TSH  Date Value Ref Range Status  03/28/2022 2.540 0.450 - 4.500 uIU/mL Final         Passed - Completed PHQ-2 or PHQ-9 in the last 360 days      Passed - Last BP in normal range    BP Readings from Last 1 Encounters:  05/12/22 122/66         Passed - Last Heart Rate in normal range    Pulse Readings from Last 1 Encounters:  05/12/22  74         Passed - Valid encounter within last 6 months    Recent Outpatient Visits           3 months ago Hypertensive kidney disease with stage 3 chronic kidney disease, unspecified whether stage 3a or 3b CKD ([place])   [facility] McG[place], Megan P, DO   4 months ago Chronic right-sided low back pain without sciatica   [employer], Megan P, DO   4 months ago Right lower quadrant abdominal pain   [facility] Mecum, Dani Gobble, PA-C   5 months ago Routine general medical examination at a health care facility   [facility], Megan P, DO   10 months ago Bipolar affective disorder, currently depressed, moderate ([place])   [place], [place], DO       Future  Appointments             In 2 weeks Wynetta Emery, Barb Merino, DO [employer], [place]   In 3 weeks Sharolyn Douglas, Clance Boll, NP [facility] [place]            Passed - CBC within normal limits and completed in the last 12 months    WBC  Date Value Ref Range Status  04/10/2022 4.3 3.4 - 10.8 x10E3/uL Final  08/07/2021 3.7 (L) 4.0 - 10.5 K/uL Final   RBC  Date Value Ref Range Status  04/10/2022 3.82 (L) 4.14 - 5.80 x10E6/uL Final  08/07/2021 4.12 (L) 4.22 - 5.81 MIL/uL Final   Hemoglobin  Date Value Ref Range Status  04/10/2022 11.7 (L) 13.0 - 17.7 g/dL Final   Hematocrit  Date Value Ref Range Status  04/10/2022 35.5 (L) 37.5 - 51.0 % Final   MCHC  Date Value Ref Range Status  04/10/2022 33.0 31.5 - 35.7 g/dL Final  08/07/2021 33.6 30.0 - 36.0 g/dL Final   [facility]  Date Value Ref Range Status  04/10/2022 30.6 26.6 - 33.0 pg Final  08/07/2021 31.6 26.0 - 34.0 pg Final   MCV  Date Value Ref Range Status  04/10/2022 93 79 - 97 fL Final   No results found for: "PLTCOUNTKUC", "LABPLAT", "POCPLA" RDW  Date Value Ref Range Status  04/10/2022 13.7 11.6 - 15.4 % Final         Passed -  CMP within normal limits and completed in the last 12 months    Albumin  Date Value Ref Range Status  04/10/2022 4.4 3.7 - 4.7 g/dL Final   Alkaline Phosphatase  Date Value Ref Range Status  04/10/2022 34 (L) 44 - 121 IU/L Final   ALT  Date Value Ref Range Status  04/10/2022 8 0 - 44 IU/L Final   ALT (SGPT) Piccolo, Waived  Date Value Ref Range Status  10/16/2016 26 10 - 47 U/L Final   AST  Date Value Ref Range Status  04/10/2022 16 0 - 40 IU/L Final   AST (SGOT) Piccolo, Waived  Date Value Ref Range Status  10/16/2016 23 11 - 38 U/L Final   BUN  Date Value Ref Range Status  05/12/2022 18 8 - 27 mg/dL Final   Calcium  Date Value Ref Range Status  05/12/2022 9.1 8.6 - 10.2 mg/dL Final   CO2  Date Value Ref Range Status  05/12/2022 25 20 - 29 mmol/L Final   Creatinine, Ser  Date Value Ref Range Status  05/12/2022 1.19 0.76 - 1.27 mg/dL Final   Glucose  Date Value Ref Range Status  05/12/2022 93 70 - 99 mg/dL Final   Glucose, Bld  Date Value Ref Range Status  08/08/2021 117 (H) 70 - 99 mg/dL Final    Comment:    Glucose reference range applies only to samples taken after fasting for at least 8 hours.   Glucose-Capillary  Date Value Ref Range Status  08/08/2021 115 (H) 70 - 99 mg/dL Final    Comment:    Glucose reference range applies only to samples taken after fasting for at least 8 hours.   Potassium  Date Value Ref Range Status  05/12/2022 4.2 3.5 - 5.2 mmol/L Final   Sodium  Date Value Ref Range Status  05/12/2022 142 134 - 144 mmol/L Final   Bilirubin Total  Date Value Ref Range Status  04/10/2022 0.7 0.0 - 1.2  mg/dL Final   Protein, ur  Date Value Ref Range Status  08/07/2021 NEGATIVE NEGATIVE mg/dL Final   Protein,UA  Date Value Ref Range Status  04/10/2022 Negative Negative/Trace Final   Total Protein  Date Value Ref Range Status  04/10/2022 6.6 6.0 - 8.5 g/dL Final   GFR calc Af Amer  Date Value Ref Range Status  10/15/2020 57  (L) >59 mL/min/1.73 Final    Comment:    **In accordance with recommendations from the NKF-ASN Task force,**   [facility] is in the process of updating its eGFR calculation to the   2021 CKD-EPI creatinine equation that estimates kidney function   without a race variable.    eGFR  Date Value Ref Range Status  05/12/2022 63 >59 mL/min/1.73 Final   GFR, Estimated  Date Value Ref Range Status  08/08/2021 54 (L) >60 mL/min Final    Comment:    (NOTE) Calculated using the CKD-EPI Creatinine Equation (2021)

## 2022-08-30 ENCOUNTER — Other Ambulatory Visit: Payer: Self-pay | Admitting: Family Medicine

## 2022-09-02 ENCOUNTER — Other Ambulatory Visit: Payer: Medicare Other

## 2022-09-02 DIAGNOSIS — R972 Elevated prostate specific antigen [PSA]: Secondary | ICD-10-CM

## 2022-09-02 NOTE — Telephone Encounter (Signed)
Requested medication (s) are due for refill today:  Due 9/31/23  Requested medication (s) are on the active medication list: yes    Last refill: 03/28/22  #90  1refill  Future visit scheduled yes 09/18/22  Notes to clinic: Not delegated, please review. Thank you.  Requested Prescriptions  Pending Prescriptions Disp Refills   risperiDONE (RISPERDAL) 0.5 MG tablet [Pharmacy Med Name: risperiDONE 0.5 MG Oral Tablet] 90 tablet 0    Sig: TAKE 1 TABLET BY MOUTH AT BEDTIME. TO BE TAKEN WITH THE $Remove'2MG'tdcpiSg$  FOR 2.$Remo'5MG'HXKHt$  TOTAL     Not Delegated - Psychiatry:  Antipsychotics - Second Generation (Atypical) - risperidone Failed - 08/30/2022  3:01 PM      Failed - This refill cannot be delegated      Failed - Lipid Panel in normal range within the last 12 months    Cholesterol, Total  Date Value Ref Range Status  03/28/2022 163 100 - 199 mg/dL Final   Cholesterol Piccolo, Waived  Date Value Ref Range Status  10/16/2016 CANCELED      Comment:    Test not performed  Result canceled by the ancillary    LDL Chol Calc (NIH)  Date Value Ref Range Status  03/28/2022 93 0 - 99 mg/dL Final   HDL  Date Value Ref Range Status  03/28/2022 47 >39 mg/dL Final   Triglycerides  Date Value Ref Range Status  03/28/2022 130 0 - 149 mg/dL Final   Triglycerides Piccolo,Waived  Date Value Ref Range Status  10/16/2016 CANCELED      Comment:    Test not performed  Result canceled by the ancillary          Passed - TSH in normal range and within 360 days    TSH  Date Value Ref Range Status  03/28/2022 2.540 0.450 - 4.500 uIU/mL Final         Passed - Completed PHQ-2 or PHQ-9 in the last 360 days      Passed - Last BP in normal range    BP Readings from Last 1 Encounters:  05/12/22 122/66         Passed - Last Heart Rate in normal range    Pulse Readings from Last 1 Encounters:  05/12/22 74         Passed - Valid encounter within last 6 months    Recent Outpatient Visits           3 months  ago Hypertensive kidney disease with stage 3 chronic kidney disease, unspecified whether stage 3a or 3b CKD ([place])   [facility] Reese, Megan P, DO   4 months ago Chronic right-sided low back pain without sciatica   [employer], Megan P, DO   4 months ago Right lower quadrant abdominal pain   [facility] Mecum, Dani Gobble, PA-C   5 months ago Routine general medical examination at a health care facility   [facility], [place] P, DO   10 months ago Bipolar affective disorder, currently depressed, moderate ([place])   [place], [place], DO       Future Appointments             In 2 weeks Wynetta Emery, Barb Merino, DO [employer], [place]   In 3 weeks Sharolyn Douglas, Clance Boll, NP [facility] [place]. [facility]            Passed - CBC  within normal limits and completed in the last 12 months    WBC  Date Value Ref Range Status  04/10/2022 4.3 3.4 - 10.8 x10E3/uL Final  08/07/2021 3.7 (L) 4.0 - 10.5 K/uL Final   RBC  Date Value Ref Range Status  04/10/2022 3.82 (L) 4.14 - 5.80 x10E6/uL Final  08/07/2021 4.12 (L) 4.22 - 5.81 MIL/uL Final   Hemoglobin  Date Value Ref Range Status  04/10/2022 11.7 (L) 13.0 - 17.7 g/dL Final   Hematocrit  Date Value Ref Range Status  04/10/2022 35.5 (L) 37.5 - 51.0 % Final   MCHC  Date Value Ref Range Status  04/10/2022 33.0 31.5 - 35.7 g/dL Final  08/07/2021 33.6 30.0 - 36.0 g/dL Final   [facility]  Date Value Ref Range Status  04/10/2022 30.6 26.6 - 33.0 pg Final  08/07/2021 31.6 26.0 - 34.0 pg Final   MCV  Date Value Ref Range Status  04/10/2022 93 79 - 97 fL Final   No results found for: "PLTCOUNTKUC", "LABPLAT", "POCPLA" RDW  Date Value Ref Range Status  04/10/2022 13.7 11.6 - 15.4 % Final         Passed - CMP within normal limits and completed in the last 12 months    Albumin  Date Value Ref Range Status   04/10/2022 4.4 3.7 - 4.7 g/dL Final   Alkaline Phosphatase  Date Value Ref Range Status  04/10/2022 34 (L) 44 - 121 IU/L Final   ALT  Date Value Ref Range Status  04/10/2022 8 0 - 44 IU/L Final   ALT (SGPT) Piccolo, Waived  Date Value Ref Range Status  10/16/2016 26 10 - 47 U/L Final   AST  Date Value Ref Range Status  04/10/2022 16 0 - 40 IU/L Final   AST (SGOT) Piccolo, Waived  Date Value Ref Range Status  10/16/2016 23 11 - 38 U/L Final   BUN  Date Value Ref Range Status  05/12/2022 18 8 - 27 mg/dL Final   Calcium  Date Value Ref Range Status  05/12/2022 9.1 8.6 - 10.2 mg/dL Final   CO2  Date Value Ref Range Status  05/12/2022 25 20 - 29 mmol/L Final   Creatinine, Ser  Date Value Ref Range Status  05/12/2022 1.19 0.76 - 1.27 mg/dL Final   Glucose  Date Value Ref Range Status  05/12/2022 93 70 - 99 mg/dL Final   Glucose, Bld  Date Value Ref Range Status  08/08/2021 117 (H) 70 - 99 mg/dL Final    Comment:    Glucose reference range applies only to samples taken after fasting for at least 8 hours.   Glucose-Capillary  Date Value Ref Range Status  08/08/2021 115 (H) 70 - 99 mg/dL Final    Comment:    Glucose reference range applies only to samples taken after fasting for at least 8 hours.   Potassium  Date Value Ref Range Status  05/12/2022 4.2 3.5 - 5.2 mmol/L Final   Sodium  Date Value Ref Range Status  05/12/2022 142 134 - 144 mmol/L Final   Bilirubin Total  Date Value Ref Range Status  04/10/2022 0.7 0.0 - 1.2 mg/dL Final   Protein, ur  Date Value Ref Range Status  08/07/2021 NEGATIVE NEGATIVE mg/dL Final   Protein,UA  Date Value Ref Range Status  04/10/2022 Negative Negative/Trace Final   Total Protein  Date Value Ref Range Status  04/10/2022 6.6 6.0 - 8.5 g/dL Final   GFR calc Af Amer  Date Value  Ref Range Status  10/15/2020 57 (L) >59 mL/min/1.73 Final    Comment:    **In accordance with recommendations from the NKF-ASN Task  force,**   [facility] is in the process of updating its eGFR calculation to the   2021 CKD-EPI creatinine equation that estimates kidney function   without a race variable.    eGFR  Date Value Ref Range Status  05/12/2022 63 >59 mL/min/1.73 Final   GFR, Estimated  Date Value Ref Range Status  08/08/2021 54 (L) >60 mL/min Final    Comment:    (NOTE) Calculated using the CKD-EPI Creatinine Equation (2021)

## 2022-09-03 LAB — PSA: Prostate Specific Ag, Serum: 21.2 ng/mL — ABNORMAL HIGH (ref 0.0–4.0)

## 2022-09-04 ENCOUNTER — Encounter: Payer: Self-pay | Admitting: Urology

## 2022-09-04 ENCOUNTER — Ambulatory Visit: Payer: Medicare Other | Admitting: Urology

## 2022-09-04 VITALS — BP 155/71 | HR 77 | Ht 63.0 in | Wt 172.0 lb

## 2022-09-04 DIAGNOSIS — R972 Elevated prostate specific antigen [PSA]: Secondary | ICD-10-CM

## 2022-09-04 NOTE — Patient Instructions (Signed)
If you have localized prostate cancer (cancer that has not spread to distant sites), you may choose to monitor your condition rather than seek immediate treatment. Watchful waiting is one form of expectant management for prostate cancer that may be effective for men with limited life expectancy and slow-growing, low-risk disease.  Track Symptoms Instead of Test Results Watchful waiting is intended for patients who are too old or too sick with other life-threatening illnesses to benefit from local therapy. Instead of using active surveillance to track routine test results, patients simply observe their own symptoms and inform their doctor if changes occur. The goal of watchful waiting is to observe the prostate cancer until it has metastasized (spread to distant sites) or started to cause symptoms such as pain or blockage of the urinary tract. At that point, patients may choose treatment.  Avoid Side Effects of Treatment and Testing Similar to active surveillance, patients under watchful waiting may choose to delay treatment because of its associated risks and side effects. With watchful waiting, men with compromised health can also avoid the side effects associated with repeated tests and biopsies.

## 2022-09-04 NOTE — Progress Notes (Signed)
   09/04/2022 3:26 PM   Dean Larsen [birth year] [id number]  Reason for visit: Follow up elevated PSA  HPI: 76 year old male with medical history notable for bipolar disorder and stroke who I originally saw in September 2021 for a persistently rising PSA.  PSA was 4.1 in April 2017, 7 in April 2018, 10.7 in February 2021, and 14.4 in August 2021.  We had previously discussed extensively the risks and benefits of biopsy, and he and his daughter deferred biopsy.  They understand the risks of missing a clinically significant prostate cancer, and we reviewed the AUA guidelines regarding the recommendation against routine screening in men over age 9. PSA has continued to rise, most recently 21.2 on 09/02/2022 from 14.2 in March 2023, and 17.7 in December 2022.  Urinalysis has been benign, most recently in April 2023.  He denies any urinary symptoms aside from some mild dysuria with first void in the morning, and denies any gross hematuria.   He had a CT abdomen and pelvis with contrast performed in April 2023 for abdominal pain.  I personally viewed and interpreted those images that show a 24g prostate, no hydronephrosis, no evidence of metastatic disease or lymphadenopathy.   We again extensively reviewed the risk of deferring biopsy and developing metastatic disease with inability to cure, and the risks of biopsy including bleeding, infection, pain, as well as side effects from treatment for possible prostate cancer were again discussed extensively.  They remain very opposed to undergoing a biopsy.  I had a very long conversation with the patient and his daughter today that his persistent rise in the PSA be most likely indicates a clinically significant prostate cancer.  With his age and comorbidities they opted for more of a watchful waiting approach, and understand the risks of developing metastatic disease and death by deferring biopsy or potential treatment.  We also discussed that some prostate  cancers can be slower growing, and he may pass away from something unrelated in the next 5 to 10 years, but this is very difficult to predict.  Continue watchful waiting for suspected prostate cancer, patient continues to defer biopsy RTC 9 months PSA prior Potentially consider yearly imaging if PSA continues to rise to evaluate for development of metastatic disease   [name], MD  [facility] [address], [place], [place] [address] [phone]

## 2022-09-09 ENCOUNTER — Telehealth: Payer: Self-pay | Admitting: Family Medicine

## 2022-09-09 NOTE — Telephone Encounter (Signed)
Copied from [place] [phone]. Topic: Medicare AWV >> Sep 09, 2022  1:05 PM Jae Dire wrote: Reason for CRM:  Left message for patient to call back and schedule Medicare Annual Wellness Visit (AWV). Please offer to do virtually or by telephone.  Last AWV: 04/15/2021  Please schedule at any time with [facility] Health Advisor.  30 minute appointment for Virtual or phone 45 minute appointment for Initial virtual/phone  Any questions, please contact me at [phone]

## 2022-09-12 ENCOUNTER — Ambulatory Visit: Payer: Medicare Other | Admitting: Family Medicine

## 2022-09-18 ENCOUNTER — Ambulatory Visit (INDEPENDENT_AMBULATORY_CARE_PROVIDER_SITE_OTHER): Payer: Medicare Other | Admitting: Family Medicine

## 2022-09-18 ENCOUNTER — Encounter: Payer: Self-pay | Admitting: Family Medicine

## 2022-09-18 VITALS — BP 133/71 | HR 69 | Temp 98.0°F | Wt 174.7 lb

## 2022-09-18 DIAGNOSIS — Z7189 Other specified counseling: Secondary | ICD-10-CM

## 2022-09-18 DIAGNOSIS — F3132 Bipolar disorder, current episode depressed, moderate: Secondary | ICD-10-CM

## 2022-09-18 DIAGNOSIS — Z87891 Personal history of nicotine dependence: Secondary | ICD-10-CM | POA: Diagnosis not present

## 2022-09-18 DIAGNOSIS — Z0000 Encounter for general adult medical examination without abnormal findings: Secondary | ICD-10-CM | POA: Diagnosis not present

## 2022-09-18 DIAGNOSIS — Z23 Encounter for immunization: Secondary | ICD-10-CM | POA: Diagnosis not present

## 2022-09-18 DIAGNOSIS — N183 Chronic kidney disease, stage 3 unspecified: Secondary | ICD-10-CM | POA: Diagnosis not present

## 2022-09-18 DIAGNOSIS — E785 Hyperlipidemia, unspecified: Secondary | ICD-10-CM | POA: Diagnosis not present

## 2022-09-18 DIAGNOSIS — F3170 Bipolar disorder, currently in remission, most recent episode unspecified: Secondary | ICD-10-CM

## 2022-09-18 DIAGNOSIS — I129 Hypertensive chronic kidney disease with stage 1 through stage 4 chronic kidney disease, or unspecified chronic kidney disease: Secondary | ICD-10-CM | POA: Diagnosis not present

## 2022-09-18 MED ORDER — RISPERIDONE 2 MG PO TABS: 2.0000 mg | ORAL_TABLET | Freq: Every day | ORAL | 1 refills | Status: DC

## 2022-09-18 MED ORDER — RISPERIDONE 0.5 MG PO TABS: 0.5000 mg | ORAL_TABLET | Freq: Every day | ORAL | 1 refills | Status: DC

## 2022-09-18 MED ORDER — BUPROPION HCL ER (XL) 300 MG PO TB24: 300.0000 mg | ORAL_TABLET | Freq: Every day | ORAL | 1 refills | Status: DC

## 2022-09-18 MED ORDER — DIVALPROEX SODIUM ER 250 MG PO TB24: 250.0000 mg | ORAL_TABLET | Freq: Two times a day (BID) | ORAL | 1 refills | Status: DC

## 2022-09-18 MED ORDER — ATORVASTATIN CALCIUM 20 MG PO TABS: 20.0000 mg | ORAL_TABLET | Freq: Every day | ORAL | 1 refills | Status: DC

## 2022-09-18 MED ORDER — DULOXETINE HCL 20 MG PO CPEP: 40.0000 mg | ORAL_CAPSULE | Freq: Every day | ORAL | 1 refills | Status: DC

## 2022-09-18 MED ORDER — OMEPRAZOLE 40 MG PO CPDR: 40.0000 mg | DELAYED_RELEASE_CAPSULE | Freq: Every day | ORAL | 1 refills | Status: DC

## 2022-09-18 MED ORDER — LOSARTAN POTASSIUM 50 MG PO TABS: 100.0000 mg | ORAL_TABLET | Freq: Every day | ORAL | 1 refills | Status: DC

## 2022-09-18 NOTE — Assessment & Plan Note (Signed)
A voluntary discussion about advance care planning including the explanation and discussion of advance directives was extensively discussed  with the patient for 6 minutes with patient and wife present.  Explanation about the health care proxy and Living will was reviewed and packet with forms with explanation of how to fill them out was given.  During this discussion, the patient was able to identify a health care proxy as his daughter and plans to fill out the paperwork required.  Patient was offered a separate [place] visit for further assistance with forms.

## 2022-09-18 NOTE — Assessment & Plan Note (Signed)
Has been off his risperidone. Restart. Call with any concerns. Recheck 6 months.

## 2022-09-18 NOTE — Assessment & Plan Note (Signed)
Under good control on current regimen. Continue current regimen. Continue to monitor. Call with any concerns. Refills given. Labs drawn today.   

## 2022-09-18 NOTE — Patient Instructions (Signed)
Preventative Services:  Health Risk Assessment and Personalized Prevention Plan: Done today Bone Mass Measurements: N/A CVD Screening: Done today Colon Cancer Screening: UTD Depression Screening: Done today Diabetes Screening: Done today Glaucoma Screening: See your eye doctor Hepatitis B vaccine: N/A Hepatitis C screening: Up to date HIV Screening: Up to date Flu Vaccine: Given today Lung cancer Screening: N/A Obesity Screening: Done today Pneumonia Vaccines: Done today STI Screening: N/A PSA screening: Sees urology

## 2022-09-18 NOTE — Progress Notes (Signed)
BP 133/71   Pulse 69   Temp 98 F (36.7 C)   Wt 174 lb 11.2 oz (79.2 kg)   SpO2 99%   BMI 30.95 kg/m    Subjective:    Patient ID: Dean Larsen, male    DOB: 02-18-1946, 76 y.o.   MRN: [id number]  HPI: Dean Larsen is a 76 y.o. male presenting on 09/18/2022 for comprehensive medical examination. Current medical complaints include:  BIPOLAR Mood status: controlled Satisfied with current treatment?: yes Symptom severity: moderate  Duration of current treatment : chronic Side effects: no Medication compliance: excellent compliance Psychotherapy/counseling: no  Depressed mood: yes Anxious mood: yes Anhedonia: no Significant weight loss or gain: no Insomnia: no  Fatigue: yes Feelings of worthlessness or guilt: no Impaired concentration/indecisiveness: no Suicidal ideations: no Hopelessness: no Crying spells: no    09/18/2022    1:26 PM 05/12/2022   10:15 AM 04/28/2022   10:32 AM 04/07/2022   11:34 AM 03/28/2022    8:16 AM  Depression screen PHQ 2/9  Decreased Interest 0 0 0 0 0  Down, Depressed, Hopeless 0 0 0 0 0  PHQ - 2 Score 0 0 0 0 0  Altered sleeping 0 0 0 0 0  Tired, decreased energy 0 0 0 0 0  Change in appetite 0 0 0 0 0  Feeling bad or failure about yourself  0 0 0 0 0  Trouble concentrating 0 0 0 0 0  Moving slowly or fidgety/restless 0 0 0 0 0  Suicidal thoughts 0 0 0 0 0  PHQ-9 Score 0 0 0 0 0  Difficult doing work/chores Not difficult at all Not difficult at all Not difficult at all Not difficult at all    HYPERTENSION / [place] Satisfied with current treatment? yes Duration of hypertension: chronic BP monitoring frequency: not checking BP medication side effects: no Past BP meds: losartan Duration of hyperlipidemia: chronic Cholesterol medication side effects: no Cholesterol supplements: none Past cholesterol medications: atorvastatin Medication compliance: excellent compliance Aspirin: yes Recent stressors: no Recurrent  headaches: no Visual changes: no Palpitations: no Dyspnea: no Chest pain: no Lower extremity edema: no Dizzy/lightheaded: no   He currently lives with: wife, daughter and grandson Interim Problems from his last visit: no  Functional Status Survey: Is the patient deaf or have difficulty hearing?: Yes Does the patient have difficulty seeing, even when wearing glasses/contacts?: No Does the patient have difficulty concentrating, remembering, or making decisions?: No Does the patient have difficulty walking or climbing stairs?: No Does the patient have difficulty dressing or bathing?: No Does the patient have difficulty doing errands alone such as visiting a doctor's office or shopping?: No  FALL RISK:    09/18/2022    1:26 PM 04/28/2022   10:32 AM 04/07/2022   11:33 AM 03/28/2022    8:16 AM 05/21/2021    9:47 AM  Fall Risk   Falls in the past year? 0 0 0 0 1  Number falls in past yr: 0 0 0 0 1  Injury with Fall? 0 1 0 0 1  Risk for fall due to : No Fall Risks No Fall Risks No Fall Risks History of fall(s) History of fall(s)  Follow up Falls evaluation completed Falls evaluation completed Falls evaluation completed Falls evaluation completed Falls evaluation completed    Depression Screen    09/18/2022    1:26 PM 05/12/2022   10:15 AM 04/28/2022   10:32 AM 04/07/2022   11:34 AM 03/28/2022  8:16 AM  Depression screen PHQ 2/9  Decreased Interest 0 0 0 0 0  Down, Depressed, Hopeless 0 0 0 0 0  PHQ - 2 Score 0 0 0 0 0  Altered sleeping 0 0 0 0 0  Tired, decreased energy 0 0 0 0 0  Change in appetite 0 0 0 0 0  Feeling bad or failure about yourself  0 0 0 0 0  Trouble concentrating 0 0 0 0 0  Moving slowly or fidgety/restless 0 0 0 0 0  Suicidal thoughts 0 0 0 0 0  PHQ-9 Score 0 0 0 0 0  Difficult doing work/chores Not difficult at all Not difficult at all Not difficult at all Not difficult at all     Advanced Directives Does not have one  Past Medical History:  Past  Medical History:  Diagnosis Date   Aortic insufficiency    a. 08/2020 Echo: EF 50-55%, bicuspid AoV, mild-mod AI; b. 07/2021 Echo: EF 55-60%, no rwma, basal-septal hypertrophy, GrI DD, nl RV fxn, mild MR/AS, bicuspid AoV, mild-mod AI. Asc Ao 28m.   Arthritis    Bicuspid aortic valve    Bipolar disorder (HCC)    CVA (cerebral vascular accident) ([facility]    Age 76- no deficits   Dilated ascending aorta ([facility]    a. 07/2021 Echo: As Ao 38m   Diverticulitis of colon    GERD (gastroesophageal reflux disease)    History of cardiovascular stress test    a. 08/2020 MV: EF 61%, no ischemia/scar. Mild coronary artery and AoV Ca2+ noted.   Hyperlipidemia    Iron deficiency anemia     Surgical History:  Past Surgical History:  Procedure Laterality Date   ESOPHAGOGASTRODUODENOSCOPY (EGD) WITH PROPOFOL N/A 05/23/2016   Procedure: ESOPHAGOGASTRODUODENOSCOPY (EGD) WITH PROPOFOL with dialation;  Surgeon: DaLucilla LameMD;  [facility] Service: Endoscopy;  Laterality: N/A;   ESOPHAGOGASTRODUODENOSCOPY (EGD) WITH PROPOFOL N/A 06/14/2019   Procedure: ESOPHAGOGASTRODUODENOSCOPY (EGD) WITH PROPOFOL;  Surgeon: WoLucilla LameMD;  Location: [facility] ENDOSCOPY;  Service: Endoscopy;  Laterality: N/A;   HERNIA REPAIR     ORTHOPEDIC SURGERY     Surgery for broken leg and toe    Medications:  Current Outpatient Medications on File Prior to Visit  Medication Sig   aspirin 81 MG tablet Take 81 mg by mouth daily.   No current facility-administered medications on file prior to visit.    Allergies:  Allergies  Allergen Reactions   Lisinopril    Codeine Rash   Penicillin G Benzathine Rash   Percocet [Oxycodone-Acetaminophen] Rash    Social History:  Social History   Socioeconomic History   Marital status: Married    Spouse name: Not on file   Number of children: Not on file   Years of education: Not on file   Highest education level: Not on file  Occupational History   Occupation:  retired   Tobacco Use   Smoking status: Former    Passive exposure: Past   Smokeless tobacco: Current    Types: Snuff  Vaping Use   Vaping Use: Never used  Substance and Sexual Activity   Alcohol use: No    Alcohol/week: 0.0 standard drinks of alcohol   Drug use: No   Sexual activity: Yes    Birth control/protection: None  Other Topics Concern   Not on file  Social History Narrative   Not on file   Social Determinants of Health   Financial Resource Strain: Low  Risk  (04/15/2021)   Overall Financial Resource Strain (CARDIA)    Difficulty of Paying Living Expenses: Not hard at all  Food Insecurity: No Food Insecurity (04/15/2021)   Hunger Vital Sign    Worried About Running Out of Food in the Last Year: Never true    Ran Out of Food in the Last Year: Never true  Transportation Needs: No Transportation Needs (04/15/2021)   PRAPARE - Hydrologist (Medical): No    Lack of Transportation (Non-Medical): No  Physical Activity: Inactive (04/15/2021)   Exercise Vital Sign    Days of Exercise per Week: 0 days    Minutes of Exercise per Session: 0 min  Stress: No Stress Concern Present (04/15/2021)   [place]    Feeling of Stress : Not at all  Social Connections: Moderately Isolated (04/11/2020)   Social Connection and Isolation Panel [NHANES]    Frequency of Communication with Friends and Family: More than three times a week    Frequency of Social Gatherings with Friends and Family: More than three times a week    Attends Religious Services: Never    Marine scientist or Organizations: No    Attends Archivist Meetings: Never    Marital Status: Married  Human resources officer Violence: Not At Risk (04/07/2018)   Humiliation, Afraid, Rape, and Kick questionnaire    Fear of Current or Ex-Partner: No    Emotionally Abused: No    Physically Abused: No    Sexually Abused: No    Social History   Tobacco Use  Smoking Status Former   Passive exposure: Past  Smokeless Tobacco Current   Types: Snuff   Social History   Substance and Sexual Activity  Alcohol Use No   Alcohol/week: 0.0 standard drinks of alcohol    Family History:  Family History  Problem Relation Age of Onset   Stroke Father    Diabetes Brother     Past medical history, surgical history, medications, allergies, family history and social history reviewed with patient today and changes made to appropriate areas of the chart.   Review of Systems  Constitutional: Negative.   HENT: Negative.    Eyes: Negative.   Respiratory: Negative.    Cardiovascular: Negative.   Gastrointestinal: Negative.   Genitourinary: Negative.   Musculoskeletal: Negative.   Skin: Negative.   Neurological: Negative.   Endo/Heme/Allergies: Negative.   Psychiatric/Behavioral: Negative.     All other ROS negative except what is listed above and in the HPI.      Objective:    BP 133/71   Pulse 69   Temp 98 F (36.7 C)   Wt 174 lb 11.2 oz (79.2 kg)   SpO2 99%   BMI 30.95 kg/m   Wt Readings from Last 3 Encounters:  09/18/22 174 lb 11.2 oz (79.2 kg)  09/04/22 172 lb (78 kg)  05/12/22 181 lb (82.1 kg)    No results found.  Physical Exam Vitals and nursing note reviewed.  Constitutional:      General: He is not in acute distress.    Appearance: Normal appearance. He is not ill-appearing, toxic-appearing or diaphoretic.  HENT:     Head: Normocephalic and atraumatic.     Right Ear: External ear normal.     Left Ear: External ear normal.     Nose: Nose normal.     Mouth/Throat:     Mouth: Mucous membranes are moist.  Pharynx: Oropharynx is clear.  Eyes:     General: No scleral icterus.       Right eye: No discharge.        Left eye: No discharge.     Extraocular Movements: Extraocular movements intact.     Conjunctiva/sclera: Conjunctivae normal.     Pupils: Pupils are equal, round, and  reactive to light.  Cardiovascular:     Rate and Rhythm: Normal rate and regular rhythm.     Pulses: Normal pulses.     Heart sounds: Normal heart sounds. No murmur heard.    No friction rub. No gallop.  Pulmonary:     Effort: Pulmonary effort is normal. No respiratory distress.     Breath sounds: Normal breath sounds. No stridor. No wheezing, rhonchi or rales.  Chest:     Chest wall: No tenderness.  Musculoskeletal:        General: Normal range of motion.     Cervical back: Normal range of motion and neck supple.  Skin:    General: Skin is warm and dry.     Capillary Refill: Capillary refill takes less than 2 seconds.     Coloration: Skin is not jaundiced or pale.     Findings: No bruising, erythema, lesion or rash.  Neurological:     General: No focal deficit present.     Mental Status: He is alert and oriented to person, place, and time. Mental status is at baseline.  Psychiatric:        Mood and Affect: Mood normal.        Behavior: Behavior normal.        Thought Content: Thought content normal.        Judgment: Judgment normal.        09/18/2022    1:38 PM 04/15/2021    9:05 AM 04/07/2018    9:16 AM  6CIT Screen  What Year? 0 points 0 points 0 points  What month? 0 points 0 points 0 points  What time? 0 points 0 points 0 points  Count back from 20 0 points 0 points 0 points  Months in reverse 0 points 4 points 0 points  Repeat phrase 6 points 6 points 10 points  Total Score 6 points 10 points 10 points    Results for orders placed or performed in visit on 09/02/22  PSA  Result Value Ref Range   Prostate Specific Ag, Serum 21.2 (H) 0.0 - 4.0 ng/mL      Assessment & Plan:   Problem List Items Addressed This Visit       Genitourinary   Hypertensive kidney disease with CKD stage III ([place])    Under good control on current regimen. Continue current regimen. Continue to monitor. Call with any concerns. Refills given. Labs drawn today       Relevant Orders    Comprehensive metabolic panel   CBC with Differential/Platelet     Other   Bipolar disorder ([place])    Has been off his risperidone. Restart. Call with any concerns. Recheck 6 months.       Relevant Medications   risperiDONE (RISPERDAL) 2 MG tablet   divalproex (DEPAKOTE ER) 250 MG 24 hr tablet   buPROPion (WELLBUTRIN XL) 300 MG 24 hr tablet   Hyperlipidemia    Under good control on current regimen. Continue current regimen. Continue to monitor. Call with any concerns. Refills given. Labs drawn today.       Relevant Medications   losartan (COZAAR) 50  MG tablet   atorvastatin (LIPITOR) 20 MG tablet   Other Relevant Orders   Comprehensive metabolic panel   Lipid Panel w/o Chol/HDL Ratio   Advanced care planning/counseling discussion    A voluntary discussion about advance care planning including the explanation and discussion of advance directives was extensively discussed  with the patient for 6 minutes with patient and wife present.  Explanation about the health care proxy and Living will was reviewed and packet with forms with explanation of how to fill them out was given.  During this discussion, the patient was able to identify a health care proxy as his daughter and plans to fill out the paperwork required.  Patient was offered a separate [place] visit for further assistance with forms.         Other Visit Diagnoses     Encounter for annual wellness visit (AWV) in Medicare patient    -  Primary   Preventative care discussed today as below.    Need for influenza vaccination       Relevant Orders   Flu Vaccine QUAD High Dose(Fluad) (Completed)        Preventative Services:  Health Risk Assessment and Personalized Prevention Plan: Done today Bone Mass Measurements: N/A CVD Screening: Done today Colon Cancer Screening: UTD Depression Screening: Done today Diabetes Screening: Done today Glaucoma Screening: See your eye doctor Hepatitis B vaccine:  N/A Hepatitis C screening: Up to date HIV Screening: Up to date Flu Vaccine: Given today Lung cancer Screening: N/A Obesity Screening: Done today Pneumonia Vaccines: Done today STI Screening: N/A PSA screening: Sees urology  IMMUNIZATIONS:   - Tdap: Tetanus vaccination status reviewed: last tetanus booster within 10 years. - Influenza: Administered today - Pneumovax: Up to date - Prevnar: Up to date - Zostavax vaccine: Not applicable  SCREENING: - Colonoscopy: Not applicable  Discussed with patient purpose of the colonoscopy is to detect colon cancer at curable precancerous or early stages   PATIENT COUNSELING:    Sexuality: Discussed sexually transmitted diseases, partner selection, use of condoms, avoidance of unintended pregnancy  and contraceptive alternatives.   Advised to avoid cigarette smoking.  I discussed with the patient that most people either abstain from alcohol or drink within safe limits (<=14/week and <=4 drinks/occasion for males, <=7/weeks and <= 3 drinks/occasion for females) and that the risk for alcohol disorders and other health effects rises proportionally with the number of drinks per week and how often a drinker exceeds daily limits.  Discussed cessation/primary prevention of drug use and availability of treatment for abuse.   Diet: Encouraged to adjust caloric intake to maintain  or achieve ideal body weight, to reduce intake of dietary saturated fat and total fat, to limit sodium intake by avoiding high sodium foods and not adding table salt, and to maintain adequate dietary potassium and calcium preferably from fresh fruits, vegetables, and low-fat dairy products.    stressed the importance of regular exercise  Injury prevention: Discussed safety belts, safety helmets, smoke detector, smoking near bedding or upholstery.   Dental health: Discussed importance of regular tooth brushing, flossing, and dental visits.   Follow up plan: NEXT PREVENTATIVE  PHYSICAL DUE IN 1 YEAR. Return in about 6 months (around 03/19/2023) for Physical.

## 2022-09-20 LAB — LIPID PANEL W/O CHOL/HDL RATIO
Cholesterol, Total: 199 mg/dL (ref 100–199)
HDL: 35 mg/dL — ABNORMAL LOW (ref >39)
LDL Chol Calc (NIH): 124 mg/dL — ABNORMAL HIGH (ref 0–99)
Triglycerides: 227 mg/dL — ABNORMAL HIGH (ref 0–149)
VLDL Cholesterol Cal: 40 mg/dL (ref 5–40)

## 2022-09-20 LAB — CBC WITH DIFFERENTIAL/PLATELET
Basophils Absolute: 0 10*3/uL (ref 0.0–0.2)
Basos: 1 %
EOS (ABSOLUTE): 0.2 10*3/uL (ref 0.0–0.4)
Eos: 3 %
Hematocrit: 39 % (ref 37.5–51.0)
Hemoglobin: 12.8 g/dL — ABNORMAL LOW (ref 13.0–17.7)
Immature Grans (Abs): 0 10*3/uL (ref 0.0–0.1)
Immature Granulocytes: 0 %
Lymphocytes Absolute: 1.5 10*3/uL (ref 0.7–3.1)
Lymphs: 31 %
MCH: 29.7 pg (ref 26.6–33.0)
MCHC: 32.8 g/dL (ref 31.5–35.7)
MCV: 91 fL (ref 79–97)
Monocytes Absolute: 0.5 10*3/uL (ref 0.1–0.9)
Monocytes: 10 %
Neutrophils Absolute: 2.6 10*3/uL (ref 1.4–7.0)
Neutrophils: 55 %
Platelets: 160 10*3/uL (ref 150–450)
RBC: 4.31 x10E6/uL (ref 4.14–5.80)
RDW: 13.6 % (ref 11.6–15.4)
WBC: 4.7 10*3/uL (ref 3.4–10.8)

## 2022-09-20 LAB — COMPREHENSIVE METABOLIC PANEL
ALT: 11 IU/L (ref 0–44)
AST: 11 IU/L (ref 0–40)
Albumin/Globulin Ratio: 1.6 (ref 1.2–2.2)
Albumin: 4.3 g/dL (ref 3.8–4.8)
Alkaline Phosphatase: 50 IU/L (ref 44–121)
BUN/Creatinine Ratio: 13 (ref 10–24)
BUN: 17 mg/dL (ref 8–27)
Bilirubin Total: 0.9 mg/dL (ref 0.0–1.2)
CO2: 27 mmol/L (ref 20–29)
Calcium: 9.2 mg/dL (ref 8.6–10.2)
Chloride: 100 mmol/L (ref 96–106)
Creatinine, Ser: 1.28 mg/dL — ABNORMAL HIGH (ref 0.76–1.27)
Globulin, Total: 2.7 g/dL (ref 1.5–4.5)
Glucose: 76 mg/dL (ref 70–99)
Potassium: 4.5 mmol/L (ref 3.5–5.2)
Sodium: 141 mmol/L (ref 134–144)
Total Protein: 7 g/dL (ref 6.0–8.5)
eGFR: 58 mL/min/{1.73_m2} — ABNORMAL LOW (ref >59)

## 2022-09-23 ENCOUNTER — Ambulatory Visit: Payer: Medicare Other | Attending: Nurse Practitioner | Admitting: Nurse Practitioner

## 2022-09-23 ENCOUNTER — Encounter: Payer: Self-pay | Admitting: Nurse Practitioner

## 2022-09-23 VITALS — BP 120/70 | HR 74 | Ht 63.0 in | Wt 173.0 lb

## 2022-09-23 DIAGNOSIS — I350 Nonrheumatic aortic (valve) stenosis: Secondary | ICD-10-CM | POA: Diagnosis not present

## 2022-09-23 DIAGNOSIS — E782 Mixed hyperlipidemia: Secondary | ICD-10-CM | POA: Diagnosis not present

## 2022-09-23 DIAGNOSIS — I351 Nonrheumatic aortic (valve) insufficiency: Secondary | ICD-10-CM

## 2022-09-23 DIAGNOSIS — I10 Essential (primary) hypertension: Secondary | ICD-10-CM

## 2022-09-23 DIAGNOSIS — N183 Chronic kidney disease, stage 3 unspecified: Secondary | ICD-10-CM | POA: Diagnosis not present

## 2022-09-23 DIAGNOSIS — Q231 Congenital insufficiency of aortic valve: Secondary | ICD-10-CM | POA: Diagnosis not present

## 2022-09-23 MED ORDER — ATORVASTATIN CALCIUM 40 MG PO TABS: 40.0000 mg | ORAL_TABLET | Freq: Every day | ORAL | 3 refills | Status: DC

## 2022-09-23 NOTE — Patient Instructions (Signed)
Medication Instructions:  Your physician has recommended you make the following change in your medication:   INCREASE Atorvastatin (Lipitor) 40 mg once daily   *If you need a refill on your cardiac medications before your next appointment, please call your pharmacy*   Lab Work: [place] to be done in 6-8 weeks. These are fasting labs so nothing to eat or drink after midnight except sip of water with your medications. Please go to the following location. NO APPOINTMENT is needed.  [facility] Entrance at [facility] 1st desk on the right to check in (REGISTRATION)  Lab hours: Monday- Friday (7:30 am- 5:30 pm)  If you have labs (blood work) drawn today and your tests are completely normal, you will receive your results only by: [place] (if you have MyChart) OR A paper copy in the mail If you have any lab test that is abnormal or we need to change your treatment, we will call you to review the results.   Testing/Procedures: Your physician has requested that you have an echocardiogram. Echocardiography is a painless test that uses sound waves to create images of your heart. It provides your doctor with information about the size and shape of your heart and how well your heart's chambers and valves are working. This procedure takes approximately one hour. There are no restrictions for this procedure.    Follow-Up: At [facility], you and your health needs are our priority.  As part of our continuing mission to provide you with exceptional heart care, we have created designated Provider Care Teams.  These Care Teams include your primary Cardiologist (physician) and Advanced Practice Providers (APPs -  Physician Assistants and Nurse Practitioners) who all work together to provide you with the care you need, when you need it.   Your next appointment:   6 month(s)  The format for your next appointment:   In Person  Provider:   Kathlyn [place], MD or Murray Hodgkins, NP         Important Information About Sugar
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
## 2022-09-25 ENCOUNTER — Ambulatory Visit: Payer: Medicare Other | Attending: Nurse Practitioner

## 2022-09-25 DIAGNOSIS — Q231 Congenital insufficiency of aortic valve: Secondary | ICD-10-CM | POA: Diagnosis not present

## 2022-09-25 DIAGNOSIS — I351 Nonrheumatic aortic (valve) insufficiency: Secondary | ICD-10-CM | POA: Diagnosis not present

## 2022-09-25 LAB — ECHOCARDIOGRAM COMPLETE
AR max vel: 3.15 cm2
AV Area VTI: 3.25 cm2
AV Area mean vel: 3.3 cm2
AV Mean grad: 2 mmHg
AV Peak grad: 3.5 mmHg
AV Vena cont: 0.5 cm
Ao pk vel: 0.94 m/s
Area-P 1/2: 2.82 cm2
Calc EF: 52.3 %
P 1/2 time: 418 msec
S' Lateral: 3.2 cm
Single Plane A2C EF: 51.1 %
Single Plane A4C EF: 54.3 %

## 2022-09-26 ENCOUNTER — Other Ambulatory Visit: Payer: Self-pay | Admitting: *Deleted

## 2022-09-26 DIAGNOSIS — Q231 Congenital insufficiency of aortic valve: Secondary | ICD-10-CM

## 2022-09-26 DIAGNOSIS — I351 Nonrheumatic aortic (valve) insufficiency: Secondary | ICD-10-CM

## 2022-09-29 ENCOUNTER — Telehealth: Payer: Self-pay | Admitting: *Deleted

## 2022-09-29 NOTE — Telephone Encounter (Signed)
No answer/Voicemail box is full.  

## 2022-09-29 NOTE — Telephone Encounter (Signed)
-----   Message from Theora Gianotti, NP sent at 09/26/2022  9:36 AM EDT ----- Normal heart squeezing function with mild stiffness and thickness of the heart muscle. Moderately leaky aortic valve, slightly worse than last year.  Aortic size is stable at 31m.  We will plan to f/u an echo in 1 year.

## 2022-10-03 NOTE — Telephone Encounter (Signed)
No answer/Voicemail box is full.  

## 2022-10-06 ENCOUNTER — Encounter: Payer: Self-pay | Admitting: *Deleted

## 2022-10-06 NOTE — Telephone Encounter (Signed)
No answer/Voicemail box is full. This is attempt #3 so will mail letter to patient.

## 2022-10-06 NOTE — Telephone Encounter (Signed)
Ok to schedule.

## 2022-10-07 ENCOUNTER — Other Ambulatory Visit: Payer: Self-pay | Admitting: Urology

## 2022-10-07 MED ORDER — DIAZEPAM 5 MG PO TABS: 5.0000 mg | ORAL_TABLET | Freq: Once | ORAL | 0 refills | Status: DC | PRN

## 2022-10-23 ENCOUNTER — Ambulatory Visit: Payer: Medicare Other | Admitting: Urology

## 2022-10-23 ENCOUNTER — Encounter: Payer: Self-pay | Admitting: Urology

## 2022-10-23 VITALS — Ht 63.0 in | Wt 173.0 lb

## 2022-10-23 DIAGNOSIS — R972 Elevated prostate specific antigen [PSA]: Secondary | ICD-10-CM

## 2022-10-23 DIAGNOSIS — C61 Malignant neoplasm of prostate: Secondary | ICD-10-CM | POA: Diagnosis not present

## 2022-10-23 DIAGNOSIS — Z7689 Persons encountering health services in other specified circumstances: Secondary | ICD-10-CM | POA: Diagnosis not present

## 2022-10-23 MED ORDER — GENTAMICIN SULFATE 40 MG/ML IJ SOLN
80.0000 mg | Freq: Once | INTRAMUSCULAR | Status: AC
  Administered 2022-10-23: 80 mg via INTRAMUSCULAR

## 2022-10-23 MED ORDER — LEVOFLOXACIN 500 MG PO TABS
500.0000 mg | ORAL_TABLET | Freq: Once | ORAL | Status: AC
  Administered 2022-10-23: 500 mg via ORAL

## 2022-10-23 NOTE — Patient Instructions (Signed)

## 2022-10-23 NOTE — Progress Notes (Signed)
   10/23/22  Indication: Elevated PSA, 21.2  Comorbid 76 year old male with history of stroke and bipolar disorder who had originally opted for watchful waiting for persistently rising PSA.  Recently family and patient opted to pursue biopsy for definitive diagnosis and consider potential treatment options.  Prostate Biopsy Procedure   Informed consent was obtained, and we discussed the risks of bleeding and infection/sepsis. A time out was performed to ensure correct patient identity.  Pre-Procedure:  - Gentamicin and levaquin given for antibiotic prophylaxis -DRE-firm right base - Transrectal Ultrasound performed revealing a 14 gm prostate - No median lobe -Irregular right lateral base with appearance of extracapsular extension  Procedure: - Prostate block performed using 10 cc 1% lidocaine and biopsies taken from sextant areas, a total of 12 under ultrasound guidance.  Post-Procedure: - Patient tolerated the procedure well - He was counseled to seek immediate medical attention if experiences significant bleeding, fevers, or severe pain - Return in one week to discuss biopsy results  Assessment/ Plan: Will follow up in 1-2 weeks to discuss pathology  Nickolas Madrid, MD 10/23/2022

## 2022-10-24 LAB — SURGICAL PATHOLOGY

## 2022-10-30 ENCOUNTER — Ambulatory Visit: Payer: Medicare Other | Admitting: Urology

## 2022-10-30 VITALS — BP 169/66 | HR 79 | Ht 63.0 in | Wt 173.0 lb

## 2022-10-30 DIAGNOSIS — C61 Malignant neoplasm of prostate: Secondary | ICD-10-CM

## 2022-10-30 NOTE — Progress Notes (Signed)
10/30/2022 11:31 AM   Dean Larsen [birth year] [id number]  Reason for visit: Prostate biopsy results  HPI: 76 year old male with bipolar disorder and history of stroke who I originally saw in September 2021 for a persistently rising PSA.  PSA was 4.1 in April 2017, 7 in April 2018, 10.7 in February 2021, and 14.4 in August 2021.  We had previously discussed extensively the risks and benefits of biopsy, and he and his daughter deferred biopsy and opted for more of a watchful waiting approach.  They apparently met with their extended family recently and ultimately changed their minds and opted to pursue a biopsy.  Most recent PSA from 09/02/2022 was 21.2.  Prostate biopsy on 10/23/2022 showed a 14 g gland, DRE notable for a firm right base, and ultrasound showed irregular margins at the right lateral base with appearance of extracapsular extension.  Pathology showed GS 3+4=7 throughout the right side of the prostate, with max core involvement of 100%.  Most recent imaging from April 2023 with CT abdomen and pelvis with contrast showed no evidence of metastatic disease.  No significant urinary symptoms at baseline, not sexually active.  We had a lengthy conversation today about the patient's new diagnosis of prostate cancer.  We reviewed the risk classifications per the AUA guidelines including very low risk, low risk, intermediate risk, and high risk disease, and the need for additional staging imaging with CT and bone scan in patients with unfavorable intermediate risk and high risk disease.  I explained that his life expectancy, clinical stage, Gleason score, PSA, and other co-morbidities influence treatment strategies.  We discussed the roles of active surveillance, radiation therapy, surgical therapy with robotic prostatectomy, and hormone therapy with androgen deprivation.  We discussed that patients urinary symptoms also impact treatment strategy, as patients with severe lower urinary tract  symptoms may have significant worsening or even develop urinary retention after undergoing radiation.  In regards to surgery, we discussed robotic prostatectomy +/- lymphadenectomy at length.  The procedure takes 3 to 4 hours, and patient's typically discharge home on post-op day #1.  A Foley catheter is left in place for 7 to 10 days to allow for healing of the vesicourethral anastomosis.  There is a small risk of bleeding, infection, damage to surrounding structures or bowel, hernia, DVT/PE, or serious cardiac or pulmonary complications.  We discussed at length post-op side effects including erectile dysfunction, and the importance of pre-operative erectile function on long-term outcomes.  Even with a nerve sparing approach, there is an approximately 25% rate of permanent erectile dysfunction.  We also discussed postop urinary incontinence at length.  We expect patients to have stress incontinence post-operatively that will improve over period of weeks to months.  Less than 10% of men will require a pad at 1 year after surgery.  Patients will need to avoid heavy lifting and strenuous activity for 3 to 4 weeks, but most men return to their baseline activity status by 6 weeks.  We also discussed watchful waiting as an option, but difficult to predict timing or development of symptoms or metastatic disease.  At this point they are more interested in definitive treatment.  With his age and comorbidities, I think he would be much better suited for radiation, especially with my high concern for extraprostatic extension at the right side based on pathology and transrectal ultrasound findings.  They are potentially interested in pursuing external beam radiation or brachytherapy with at least a 4-monthcourse of ADT.  Referral placed to  radiation oncology, will help coordinate initiation of ADT, markers if needed, or brachytherapy  I spent 45 total minutes on the day of the encounter including pre-visit review of  the medical record, face-to-face time with the patient, and post visit ordering of labs/imaging/tests.   [name], [facility] [address], [place], [place] [address] [phone]

## 2022-10-30 NOTE — Patient Instructions (Signed)
Prostate Cancer  The prostate is a small gland that produces fluid that makes up semen (seminal fluid). It is located below the bladder in men, in front of the rectum. Prostate cancer is the abnormal growth of cells in the prostate gland. What are the causes? The exact cause of this condition is not known. What increases the risk? You are more likely to develop this condition if: You are 77 years of age or older. You have a family history of prostate cancer. You have a family history of breast and ovarian cancer. You have genes that are passed from parent to child (inherited), such as BRCA1 and BRCA2. You have Lynch syndrome. African American men and men of African descent are diagnosed with prostate cancer at higher rates than other men. The reasons for this are not well understood and are likely due to a combination of genetic and environmental factors. What are the signs or symptoms? Symptoms of this condition include: Problems with urination. This may include: A weak or interrupted flow of urine. Trouble starting or stopping urination. Trouble emptying the bladder all the way. The need to urinate more often, especially at night. Blood in urine or semen. Persistent pain or discomfort in the lower back, lower abdomen, or hips. Trouble getting an erection. Weakness or numbness in the legs or feet. How is this diagnosed? This condition can be diagnosed with: A digital rectal exam. For this exam, a health care provider inserts a gloved finger into the rectum to feel the prostate gland. A blood test called a prostate-specific antigen (PSA) test. A procedure in which a sample of tissue is taken from the prostate and checked under a microscope (prostate biopsy). An imaging test called transrectal ultrasonography. Once the condition is diagnosed, tests will be done to determine how far the cancer has spread. This is called staging the cancer. Staging may involve imaging tests, such as a bone  scan, CT scan, PET scan, or MRI. Stages of prostate cancer The stages of prostate cancer are as follows: Stage 1 (I). At this stage, the cancer is found in the prostate only. The cancer is not visible on imaging tests, and it is usually found by accident, such as during prostate surgery. Stage 2 (II). At this stage, the cancer is more advanced than it is in stage 1, but the cancer has not spread outside the prostate. Stage 3 (III). At this stage, the cancer has spread beyond the outer layer of the prostate to nearby tissues. The cancer may be found in the seminal vesicles, which are near the bladder and the prostate. Stage 4 (IV). At this stage, the cancer has spread to other parts of the body, such as the lymph nodes, bones, bladder, rectum, liver, or lungs. Prostate cancer grading Prostate cancer is also graded according to how the cancer cells look under a microscope. This is called the Gleason score and the total score can range from 6-10, indicating how likely it is that the cancer will spread (metastasize) to other parts of the body. The higher the score, the greater the likelihood that the cancer will spread. Gleason 6 or lower: This indicates that the cancer cells look similar to normal prostate cells (well differentiated). Gleason 7: This indicates that the cancer cells look somewhat similar to normal prostate cells (moderately differentiated). Gleason 8, 9, or 10: This indicates that the cancer cells look very different than normal prostate cells (poorly differentiated). How is this treated? Treatment for this condition depends on several  factors, including the stage of the cancer, your age, personal preferences, and your overall health. Talk with your health care provider about treatment options that are recommended for you. Common treatments include: Observation for early stage prostate cancer (active surveillance). This involves having exams, blood tests, and in some cases, more biopsies.  For some men, this is the only treatment needed. Surgery. Types of surgeries include: Open surgery (radical prostatectomy). In this surgery, a larger incision is made to remove the prostate. A laparoscopic radical prostatectomy. This is a surgery to remove the prostate and lymph nodes through several small incisions. It is often referred to as a minimally invasive surgery. A robotic radical prostatectomy. This is laparoscopic surgery to remove the prostate and lymph nodes with the help of robotic arms that are controlled by the surgeon. Cryoablation. This is surgery to freeze and destroy cancer cells. Radiation treatment. Types of radiation treatment include: External beam radiation. This type aims beams of radiation from outside the body at the prostate to destroy cancerous cells. Brachytherapy. This type uses radioactive needles, seeds, wires, or tubes that are implanted into the prostate gland. Like external beam radiation, brachytherapy destroys cancerous cells. An advantage is that this type of radiation limits the damage to surrounding tissue and has fewer side effects. Chemotherapy. This treatment kills cancer cells or stops them from multiplying. It kills both cancer cells and normal cells. Targeted therapy. This treatment uses medicines to kill cancer cells without damaging normal cells. Hormone treatment. This treatment involves taking medicines that act on testosterone, one of the male hormones, by: Stopping your body from producing testosterone. Blocking testosterone from reaching cancer cells. Follow these instructions at home: Lifestyle Do not use any products that contain nicotine or tobacco. These products include cigarettes, chewing tobacco, and vaping devices, such as e-cigarettes. If you need help quitting, ask your health care provider. Eat a healthy diet. To do this: Eat foods that are high in fiber. These include beans, whole grains, and fresh fruits and vegetables. Limit  foods that are high in fat and sugar. These include fried or sweet foods. Treatment for prostate cancer may affect sexual function. If you have a partner, continue to have intimate moments. This may include touching, holding, hugging, and caressing your partner. Get plenty of sleep. Consider joining a support group for men who have prostate cancer. Meeting with a support group may help you learn to manage the stress of having cancer. General instructions Take over-the-counter and prescription medicines only as told by your health care provider. If you have to go to the hospital, notify your cancer specialist (oncologist). Keep all follow-up visits. This is important. Where to find more information American Cancer Society: www.cancer.[place] [employer]: www.cancer.net [employer]: www.cancer.gov Contact a health care provider if: You have new or increasing trouble urinating. You have new or increasing blood in your urine. You have new or increasing pain in your hips, back, or chest. Get help right away if: You have weakness or numbness in your legs. You cannot control urination or your bowel movements (incontinence). You have chills or a fever. Summary The prostate is a small gland that is involved in the production of semen. It is located below a man's bladder, in front of the rectum. Prostate cancer is the abnormal growth of cells in the prostate gland. Treatment for this condition depends on the stage of the cancer, your age, personal preferences, and your overall health. Talk with your health care provider about  treatment options that are recommended for you. Consider joining a support group for men who have prostate cancer. Meeting with a support group may help you learn to manage the stress of having cancer. This information is not intended to replace advice given to you by your health care provider. Make sure you discuss any questions you have with  your health care provider. Document Revised: 03/13/2021 Document Reviewed: 03/13/2021 Elsevier Patient Education  [place].

## 2022-11-13 ENCOUNTER — Encounter: Payer: Self-pay | Admitting: Radiation Oncology

## 2022-11-13 ENCOUNTER — Ambulatory Visit
Admission: RE | Admit: 2022-11-13 | Discharge: 2022-11-13 | Disposition: A | Discharge status: Home / Self Care | Payer: Medicare Other | Source: Ambulatory Visit | Attending: Radiation Oncology | Admitting: Radiation Oncology

## 2022-11-13 VITALS — BP 160/75 | HR 75 | Temp 97.9°F | Resp 16 | Ht 63.0 in | Wt 177.0 lb

## 2022-11-13 DIAGNOSIS — Z8673 Personal history of transient ischemic attack (TIA), and cerebral infarction without residual deficits: Secondary | ICD-10-CM | POA: Diagnosis not present

## 2022-11-13 DIAGNOSIS — Z87891 Personal history of nicotine dependence: Secondary | ICD-10-CM | POA: Diagnosis not present

## 2022-11-13 DIAGNOSIS — C61 Malignant neoplasm of prostate: Secondary | ICD-10-CM | POA: Diagnosis not present

## 2022-11-13 DIAGNOSIS — Z191 Hormone sensitive malignancy status: Secondary | ICD-10-CM | POA: Diagnosis not present

## 2022-11-13 NOTE — Consult Note (Signed)
NEW PATIENT EVALUATION  Name: Dean Larsen  MRN: [id number]  Date:   11/13/2022     DOB: 28-Jan-1946   This 75 y.o. male patient presents to the clinic for initial evaluation of stage IIIb (cT3 aN0 M0) Gleason 7 (3+4) adenocarcinoma the prostate presenting with a PSA of 21.  REFERRING PHYSICIAN: Valerie Roys, DO  CHIEF COMPLAINT:  Chief Complaint  Patient presents with   Prostate Cancer    Consult    DIAGNOSIS: The encounter diagnosis was Malignant neoplasm of prostate ([place]).   PREVIOUS INVESTIGATIONS:  CT scan of abdomen pelvis reviewed bone scan ordered Clinical notes reviewed Pathology reports reviewed  HPI: Patient is a 76 year old male with bilateral Polar disorder previous history of CVA presented in 31 with a rising PSA of 4.1.  He underwent watchful waiting approach although recently his PSA has climbed this September to 21.2.  In October he underwent transrectal ultrasound-guided biopsy showing 5 of 12 cores positive for Gleason 7 (3+4).  Digital rectal exam also showed a firm right base and ultrasound showed irregular margins of the right lateral base consistent with extracapsular extension.  He has very little lower urinary tract symptoms.  CT scan of abdomen and pelvis showed no acute findings in the abdomen or pelvis no evidence of adenopathy.  He is now referred to radiation collagen for opinion.  PLANNED TREATMENT REGIMEN: IMRT radiation therapy to prostate and pelvic nodes plus ADT therapy  PAST MEDICAL HISTORY:  has a past medical history of Aortic insufficiency, Arthritis, Bicuspid aortic valve, Bipolar disorder ([place]), CVA (cerebral vascular accident) ([place]), Dilated ascending aorta ([place]), Diverticulitis of colon, GERD (gastroesophageal reflux disease), History of cardiovascular stress test, Hyperlipidemia, and Iron deficiency anemia.    PAST SURGICAL HISTORY:  Past Surgical History:  Procedure Laterality Date   ESOPHAGOGASTRODUODENOSCOPY (EGD) WITH PROPOFOL  N/A 05/23/2016   Procedure: ESOPHAGOGASTRODUODENOSCOPY (EGD) WITH PROPOFOL with dialation;  Surgeon: Lucilla Lame, MD;  Location: [facility];  Service: Endoscopy;  Laterality: N/A;   ESOPHAGOGASTRODUODENOSCOPY (EGD) WITH PROPOFOL N/A 06/14/2019   Procedure: ESOPHAGOGASTRODUODENOSCOPY (EGD) WITH PROPOFOL;  Surgeon: Lucilla Lame, MD;  Location: [facility];  Service: Endoscopy;  Laterality: N/A;   HERNIA REPAIR     ORTHOPEDIC SURGERY     Surgery for broken leg and toe    FAMILY HISTORY: family history includes Diabetes in his brother; Stroke in his father.  SOCIAL HISTORY:  reports that he has quit smoking. He has been exposed to tobacco smoke. His smokeless tobacco use includes snuff. He reports that he does not drink alcohol and does not use drugs.  ALLERGIES: Lisinopril, Codeine, Penicillin g benzathine, and Percocet [oxycodone-acetaminophen]  MEDICATIONS:  Current Outpatient Medications  Medication Sig Dispense Refill   aspirin 81 MG tablet Take 81 mg by mouth daily.     atorvastatin (LIPITOR) 40 MG tablet Take 1 tablet (40 mg total) by mouth daily. 90 tablet 3   buPROPion (WELLBUTRIN XL) 300 MG 24 hr tablet Take 1 tablet (300 mg total) by mouth daily. 90 tablet 1   divalproex (DEPAKOTE ER) 250 MG 24 hr tablet Take 1 tablet (250 mg total) by mouth 2 (two) times daily. 180 tablet 1   DULoxetine (CYMBALTA) 20 MG capsule Take 2 capsules (40 mg total) by mouth daily. 180 capsule 1   losartan (COZAAR) 50 MG tablet Take 2 tablets (100 mg total) by mouth daily. 90 tablet 1   omeprazole (PRILOSEC) 40 MG capsule Take 1 capsule (40 mg total) by mouth daily. [place]  capsule 1   risperiDONE (RISPERDAL) 0.5 MG tablet Take 1 tablet (0.5 mg total) by mouth at bedtime. To be taken with the '2mg'$  for 2.'5mg'$  total 90 tablet 1   risperiDONE (RISPERDAL) 2 MG tablet Take 1 tablet (2 mg total) by mouth daily. To be taken with the 0.'5mg'$  for 2.'5mg'$  total 90 tablet 1   No current facility-administered  medications for this encounter.    ECOG PERFORMANCE STATUS:  0 - Asymptomatic  REVIEW OF SYSTEMS: Patient has a history of bipolar disorder also status post CVA Patient denies any weight loss, fatigue, weakness, fever, chills or night sweats. Patient denies any loss of vision, blurred vision. Patient denies any ringing  of the ears or hearing loss. No irregular heartbeat. Patient denies heart murmur or history of fainting. Patient denies any chest pain or pain radiating to her upper extremities. Patient denies any shortness of breath, difficulty breathing at night, cough or hemoptysis. Patient denies any swelling in the lower legs. Patient denies any nausea vomiting, vomiting of blood, or coffee ground material in the vomitus. Patient denies any stomach pain. Patient states has had normal bowel movements no significant constipation or diarrhea. Patient denies any dysuria, hematuria or significant nocturia. Patient denies any problems walking, swelling in the joints or loss of balance. Patient denies any skin changes, loss of hair or loss of weight. Patient denies any excessive worrying or anxiety or significant depression. Patient denies any problems with insomnia. Patient denies excessive thirst, polyuria, polydipsia. Patient denies any swollen glands, patient denies easy bruising or easy bleeding. Patient denies any recent infections, allergies or URI. Patient "s visual fields have not changed significantly in recent time.   PHYSICAL EXAM: BP (!) 160/75   Pulse 75   Temp 97.9 F (36.6 C)   Resp 16   Ht '5\' 3"'$  (1.6 m)   Wt 177 lb (80.3 kg)   BMI 31.35 kg/m  Well-developed well-nourished patient in NAD. HEENT reveals PERLA, EOMI, discs not visualized.  Oral cavity is clear. No oral mucosal lesions are identified. Neck is clear without evidence of cervical or supraclavicular adenopathy. Lungs are clear to A&P. Cardiac examination is essentially unremarkable with regular rate and rhythm without murmur  rub or thrill. Abdomen is benign with no organomegaly or masses noted. Motor sensory and DTR levels are equal and symmetric in the upper and lower extremities. Cranial nerves II through XII are grossly intact. Proprioception is intact. No peripheral adenopathy or edema is identified. No motor or sensory levels are noted. Crude visual fields are within normal range.  LABORATORY DATA: Pathology reports reviewed    RADIOLOGY RESULTS: CT scan abdomen pelvis reviewed bone scan ordered   IMPRESSION: Stage IIIb Gleason 7 (3+4) adenocarcinoma the prostate presenting with a PSA of 36 in 76 year old male  PLAN: At this time I have performed the [facility] nomogram showing a 24% chance of lymph node involvement.  Based on this I would offer radiation therapy to both his prostate and pelvic nodes.  I will plan on delivering 93 Pearline Cables to his prostate and 36 Pearline Cables to his pelvic nodes using IMRT dose painting technique.  I have also asked the patient to return to urology for ADT therapy with Eligard.  He based on his PSA should probably be suppressed for 2 to 3 years.  Risks and benefits of treatment occluding increased lower Neri tract symptoms diarrhea fatigue alteration of blood count skin reaction all were reviewed with the patient and his family.  Once bone  scan is completed we will set him up for CT simulation.  Patient and family comprehend the recommendations well.  I would like to take this opportunity to thank you for allowing me to participate in the care of your patient.Noreene Filbert, MD

## 2022-11-24 ENCOUNTER — Encounter
Admission: RE | Admit: 2022-11-24 | Discharge: 2022-11-24 | Disposition: A | Discharge status: Home / Self Care | Payer: Medicare Other | Source: Ambulatory Visit | Attending: Radiation Oncology | Admitting: Radiation Oncology

## 2022-11-24 DIAGNOSIS — M19071 Primary osteoarthritis, right ankle and foot: Secondary | ICD-10-CM | POA: Diagnosis not present

## 2022-11-24 DIAGNOSIS — C61 Malignant neoplasm of prostate: Secondary | ICD-10-CM | POA: Diagnosis not present

## 2022-11-24 DIAGNOSIS — M170 Bilateral primary osteoarthritis of knee: Secondary | ICD-10-CM | POA: Diagnosis not present

## 2022-11-24 MED ORDER — TECHNETIUM TC 99M MEDRONATE IV KIT
20.0000 | PACK | Freq: Once | INTRAVENOUS | Status: AC | PRN
  Administered 2022-11-24: 21.23 via INTRAVENOUS

## 2022-11-26 ENCOUNTER — Ambulatory Visit
Admission: RE | Admit: 2022-11-26 | Discharge: 2022-11-26 | Disposition: A | Discharge status: Home / Self Care | Payer: Medicare Other | Source: Ambulatory Visit | Attending: Radiation Oncology | Admitting: Radiation Oncology

## 2022-11-26 DIAGNOSIS — Z87891 Personal history of nicotine dependence: Secondary | ICD-10-CM | POA: Diagnosis not present

## 2022-11-26 DIAGNOSIS — Z8673 Personal history of transient ischemic attack (TIA), and cerebral infarction without residual deficits: Secondary | ICD-10-CM | POA: Diagnosis not present

## 2022-11-26 DIAGNOSIS — Z191 Hormone sensitive malignancy status: Secondary | ICD-10-CM | POA: Diagnosis not present

## 2022-11-26 DIAGNOSIS — C61 Malignant neoplasm of prostate: Secondary | ICD-10-CM | POA: Diagnosis not present

## 2022-11-27 DIAGNOSIS — C61 Malignant neoplasm of prostate: Secondary | ICD-10-CM | POA: Diagnosis not present

## 2022-11-27 DIAGNOSIS — Z87891 Personal history of nicotine dependence: Secondary | ICD-10-CM | POA: Diagnosis not present

## 2022-11-27 DIAGNOSIS — Z191 Hormone sensitive malignancy status: Secondary | ICD-10-CM | POA: Diagnosis not present

## 2022-11-27 DIAGNOSIS — Z8673 Personal history of transient ischemic attack (TIA), and cerebral infarction without residual deficits: Secondary | ICD-10-CM | POA: Diagnosis not present

## 2022-12-04 ENCOUNTER — Other Ambulatory Visit: Payer: Self-pay | Admitting: *Deleted

## 2022-12-04 DIAGNOSIS — C61 Malignant neoplasm of prostate: Secondary | ICD-10-CM

## 2022-12-08 ENCOUNTER — Ambulatory Visit: Payer: Medicare Other

## 2022-12-09 ENCOUNTER — Ambulatory Visit
Admission: RE | Admit: 2022-12-09 | Discharge: 2022-12-09 | Disposition: A | Discharge status: Home / Self Care | Payer: Medicare Other | Source: Ambulatory Visit | Attending: Radiation Oncology | Admitting: Radiation Oncology

## 2022-12-09 ENCOUNTER — Ambulatory Visit: Payer: Medicare Other

## 2022-12-09 DIAGNOSIS — Z8673 Personal history of transient ischemic attack (TIA), and cerebral infarction without residual deficits: Secondary | ICD-10-CM | POA: Insufficient documentation

## 2022-12-09 DIAGNOSIS — Z87891 Personal history of nicotine dependence: Secondary | ICD-10-CM | POA: Insufficient documentation

## 2022-12-09 DIAGNOSIS — C61 Malignant neoplasm of prostate: Secondary | ICD-10-CM | POA: Insufficient documentation

## 2022-12-10 ENCOUNTER — Ambulatory Visit
Admission: RE | Admit: 2022-12-10 | Discharge: 2022-12-10 | Disposition: A | Discharge status: Home / Self Care | Payer: Medicare Other | Source: Ambulatory Visit | Attending: Radiation Oncology | Admitting: Radiation Oncology

## 2022-12-10 ENCOUNTER — Other Ambulatory Visit: Payer: Self-pay

## 2022-12-10 DIAGNOSIS — Z191 Hormone sensitive malignancy status: Secondary | ICD-10-CM | POA: Diagnosis not present

## 2022-12-10 DIAGNOSIS — C61 Malignant neoplasm of prostate: Secondary | ICD-10-CM | POA: Diagnosis present

## 2022-12-10 DIAGNOSIS — Z8673 Personal history of transient ischemic attack (TIA), and cerebral infarction without residual deficits: Secondary | ICD-10-CM | POA: Diagnosis not present

## 2022-12-10 DIAGNOSIS — Z87891 Personal history of nicotine dependence: Secondary | ICD-10-CM | POA: Diagnosis not present

## 2022-12-10 DIAGNOSIS — Z510 Encounter for antineoplastic radiation therapy: Secondary | ICD-10-CM | POA: Diagnosis not present

## 2022-12-10 LAB — RAD ONC ARIA SESSION SUMMARY
Course Elapsed Days: 0
Plan Fractions Treated to Date: 1
Plan Prescribed Dose Per Fraction: 2 Gy
Plan Total Fractions Prescribed: 40
Plan Total Prescribed Dose: 80 Gy
Reference Point Dosage Given to Date: 2 Gy
Reference Point Session Dosage Given: 2 Gy
Session Number: 1

## 2022-12-11 ENCOUNTER — Ambulatory Visit
Admission: RE | Admit: 2022-12-11 | Discharge: 2022-12-11 | Disposition: A | Discharge status: Home / Self Care | Payer: Medicare Other | Source: Ambulatory Visit | Attending: Radiation Oncology | Admitting: Radiation Oncology

## 2022-12-11 ENCOUNTER — Other Ambulatory Visit: Payer: Self-pay

## 2022-12-11 DIAGNOSIS — Z510 Encounter for antineoplastic radiation therapy: Secondary | ICD-10-CM | POA: Diagnosis not present

## 2022-12-11 DIAGNOSIS — Z87891 Personal history of nicotine dependence: Secondary | ICD-10-CM | POA: Diagnosis not present

## 2022-12-11 DIAGNOSIS — Z8673 Personal history of transient ischemic attack (TIA), and cerebral infarction without residual deficits: Secondary | ICD-10-CM | POA: Diagnosis not present

## 2022-12-11 DIAGNOSIS — C61 Malignant neoplasm of prostate: Secondary | ICD-10-CM | POA: Diagnosis not present

## 2022-12-11 DIAGNOSIS — Z191 Hormone sensitive malignancy status: Secondary | ICD-10-CM | POA: Diagnosis not present

## 2022-12-11 LAB — RAD ONC ARIA SESSION SUMMARY
Course Elapsed Days: 1
Plan Fractions Treated to Date: 2
Plan Prescribed Dose Per Fraction: 2 Gy
Plan Total Fractions Prescribed: 40
Plan Total Prescribed Dose: 80 Gy
Reference Point Dosage Given to Date: 4 Gy
Reference Point Session Dosage Given: 2 Gy
Session Number: 2

## 2022-12-12 ENCOUNTER — Ambulatory Visit
Admission: RE | Admit: 2022-12-12 | Discharge: 2022-12-12 | Disposition: A | Discharge status: Home / Self Care | Payer: Medicare Other | Source: Ambulatory Visit | Attending: Radiation Oncology | Admitting: Radiation Oncology

## 2022-12-12 ENCOUNTER — Other Ambulatory Visit: Payer: Self-pay

## 2022-12-12 DIAGNOSIS — Z87891 Personal history of nicotine dependence: Secondary | ICD-10-CM | POA: Diagnosis not present

## 2022-12-12 DIAGNOSIS — Z8673 Personal history of transient ischemic attack (TIA), and cerebral infarction without residual deficits: Secondary | ICD-10-CM | POA: Diagnosis not present

## 2022-12-12 DIAGNOSIS — Z191 Hormone sensitive malignancy status: Secondary | ICD-10-CM | POA: Diagnosis not present

## 2022-12-12 DIAGNOSIS — C61 Malignant neoplasm of prostate: Secondary | ICD-10-CM | POA: Diagnosis not present

## 2022-12-12 DIAGNOSIS — Z510 Encounter for antineoplastic radiation therapy: Secondary | ICD-10-CM | POA: Diagnosis not present

## 2022-12-12 LAB — RAD ONC ARIA SESSION SUMMARY
Course Elapsed Days: 2
Plan Fractions Treated to Date: 3
Plan Prescribed Dose Per Fraction: 2 Gy
Plan Total Fractions Prescribed: 40
Plan Total Prescribed Dose: 80 Gy
Reference Point Dosage Given to Date: 6 Gy
Reference Point Session Dosage Given: 2 Gy
Session Number: 3

## 2022-12-15 ENCOUNTER — Ambulatory Visit
Admission: RE | Admit: 2022-12-15 | Discharge: 2022-12-15 | Disposition: A | Discharge status: Home / Self Care | Payer: Medicare Other | Source: Ambulatory Visit | Attending: Radiation Oncology | Admitting: Radiation Oncology

## 2022-12-15 ENCOUNTER — Other Ambulatory Visit: Payer: Self-pay

## 2022-12-15 DIAGNOSIS — Z87891 Personal history of nicotine dependence: Secondary | ICD-10-CM | POA: Diagnosis not present

## 2022-12-15 DIAGNOSIS — C61 Malignant neoplasm of prostate: Secondary | ICD-10-CM | POA: Diagnosis not present

## 2022-12-15 DIAGNOSIS — Z510 Encounter for antineoplastic radiation therapy: Secondary | ICD-10-CM | POA: Diagnosis not present

## 2022-12-15 DIAGNOSIS — Z191 Hormone sensitive malignancy status: Secondary | ICD-10-CM | POA: Diagnosis not present

## 2022-12-15 DIAGNOSIS — Z8673 Personal history of transient ischemic attack (TIA), and cerebral infarction without residual deficits: Secondary | ICD-10-CM | POA: Diagnosis not present

## 2022-12-15 LAB — RAD ONC ARIA SESSION SUMMARY
Course Elapsed Days: 5
Plan Fractions Treated to Date: 4
Plan Prescribed Dose Per Fraction: 2 Gy
Plan Total Fractions Prescribed: 40
Plan Total Prescribed Dose: 80 Gy
Reference Point Dosage Given to Date: 8 Gy
Reference Point Session Dosage Given: 2 Gy
Session Number: 4

## 2022-12-16 ENCOUNTER — Inpatient Hospital Stay: Payer: Medicare Other

## 2022-12-16 ENCOUNTER — Ambulatory Visit
Admission: RE | Admit: 2022-12-16 | Discharge: 2022-12-16 | Disposition: A | Discharge status: Home / Self Care | Payer: Medicare Other | Source: Ambulatory Visit | Attending: Radiation Oncology | Admitting: Radiation Oncology

## 2022-12-16 ENCOUNTER — Other Ambulatory Visit: Payer: Self-pay

## 2022-12-16 DIAGNOSIS — Z191 Hormone sensitive malignancy status: Secondary | ICD-10-CM | POA: Diagnosis not present

## 2022-12-16 DIAGNOSIS — C61 Malignant neoplasm of prostate: Secondary | ICD-10-CM | POA: Insufficient documentation

## 2022-12-16 DIAGNOSIS — C775 Secondary and unspecified malignant neoplasm of intrapelvic lymph nodes: Secondary | ICD-10-CM | POA: Insufficient documentation

## 2022-12-16 DIAGNOSIS — Z87891 Personal history of nicotine dependence: Secondary | ICD-10-CM | POA: Diagnosis not present

## 2022-12-16 DIAGNOSIS — Z8673 Personal history of transient ischemic attack (TIA), and cerebral infarction without residual deficits: Secondary | ICD-10-CM | POA: Diagnosis not present

## 2022-12-16 DIAGNOSIS — Z510 Encounter for antineoplastic radiation therapy: Secondary | ICD-10-CM | POA: Diagnosis not present

## 2022-12-16 LAB — RAD ONC ARIA SESSION SUMMARY
Course Elapsed Days: 6
Plan Fractions Treated to Date: 5
Plan Prescribed Dose Per Fraction: 2 Gy
Plan Total Fractions Prescribed: 40
Plan Total Prescribed Dose: 80 Gy
Reference Point Dosage Given to Date: 10 Gy
Reference Point Session Dosage Given: 2 Gy
Session Number: 5

## 2022-12-16 LAB — CBC
HCT: 36.2 % — ABNORMAL LOW (ref 39.0–52.0)
Hemoglobin: 11.8 g/dL — ABNORMAL LOW (ref 13.0–17.0)
MCH: 30.3 pg (ref 26.0–34.0)
MCHC: 32.6 g/dL (ref 30.0–36.0)
MCV: 92.8 fL (ref 80.0–100.0)
Platelets: 171 10*3/uL (ref 150–400)
RBC: 3.9 MIL/uL — ABNORMAL LOW (ref 4.22–5.81)
RDW: 12.8 % (ref 11.5–15.5)
WBC: 3.9 10*3/uL — ABNORMAL LOW (ref 4.0–10.5)
nRBC: 0 % (ref 0.0–0.2)

## 2022-12-17 ENCOUNTER — Other Ambulatory Visit: Payer: Self-pay

## 2022-12-17 ENCOUNTER — Ambulatory Visit
Admission: RE | Admit: 2022-12-17 | Discharge: 2022-12-17 | Disposition: A | Discharge status: Home / Self Care | Payer: Medicare Other | Source: Ambulatory Visit | Attending: Radiation Oncology | Admitting: Radiation Oncology

## 2022-12-17 DIAGNOSIS — Z191 Hormone sensitive malignancy status: Secondary | ICD-10-CM | POA: Diagnosis not present

## 2022-12-17 DIAGNOSIS — Z87891 Personal history of nicotine dependence: Secondary | ICD-10-CM | POA: Diagnosis not present

## 2022-12-17 DIAGNOSIS — C61 Malignant neoplasm of prostate: Secondary | ICD-10-CM | POA: Diagnosis not present

## 2022-12-17 DIAGNOSIS — Z510 Encounter for antineoplastic radiation therapy: Secondary | ICD-10-CM | POA: Diagnosis not present

## 2022-12-17 DIAGNOSIS — Z8673 Personal history of transient ischemic attack (TIA), and cerebral infarction without residual deficits: Secondary | ICD-10-CM | POA: Diagnosis not present

## 2022-12-17 LAB — RAD ONC ARIA SESSION SUMMARY
Course Elapsed Days: 7
Plan Fractions Treated to Date: 6
Plan Prescribed Dose Per Fraction: 2 Gy
Plan Total Fractions Prescribed: 40
Plan Total Prescribed Dose: 80 Gy
Reference Point Dosage Given to Date: 12 Gy
Reference Point Session Dosage Given: 2 Gy
Session Number: 6

## 2022-12-18 ENCOUNTER — Ambulatory Visit
Admission: RE | Admit: 2022-12-18 | Discharge: 2022-12-18 | Disposition: A | Discharge status: Home / Self Care | Payer: Medicare Other | Source: Ambulatory Visit | Attending: Radiation Oncology | Admitting: Radiation Oncology

## 2022-12-18 ENCOUNTER — Other Ambulatory Visit: Payer: Self-pay

## 2022-12-18 DIAGNOSIS — Z510 Encounter for antineoplastic radiation therapy: Secondary | ICD-10-CM | POA: Diagnosis not present

## 2022-12-18 DIAGNOSIS — Z87891 Personal history of nicotine dependence: Secondary | ICD-10-CM | POA: Diagnosis not present

## 2022-12-18 DIAGNOSIS — C61 Malignant neoplasm of prostate: Secondary | ICD-10-CM | POA: Diagnosis not present

## 2022-12-18 DIAGNOSIS — Z191 Hormone sensitive malignancy status: Secondary | ICD-10-CM | POA: Diagnosis not present

## 2022-12-18 DIAGNOSIS — Z8673 Personal history of transient ischemic attack (TIA), and cerebral infarction without residual deficits: Secondary | ICD-10-CM | POA: Diagnosis not present

## 2022-12-18 LAB — RAD ONC ARIA SESSION SUMMARY
Course Elapsed Days: 8
Plan Fractions Treated to Date: 7
Plan Prescribed Dose Per Fraction: 2 Gy
Plan Total Fractions Prescribed: 40
Plan Total Prescribed Dose: 80 Gy
Reference Point Dosage Given to Date: 14 Gy
Reference Point Session Dosage Given: 2 Gy
Session Number: 7

## 2022-12-19 ENCOUNTER — Other Ambulatory Visit: Payer: Self-pay

## 2022-12-19 ENCOUNTER — Ambulatory Visit
Admission: RE | Admit: 2022-12-19 | Discharge: 2022-12-19 | Disposition: A | Discharge status: Home / Self Care | Payer: Medicare Other | Source: Ambulatory Visit | Attending: Radiation Oncology | Admitting: Radiation Oncology

## 2022-12-19 DIAGNOSIS — Z8673 Personal history of transient ischemic attack (TIA), and cerebral infarction without residual deficits: Secondary | ICD-10-CM | POA: Diagnosis not present

## 2022-12-19 DIAGNOSIS — Z510 Encounter for antineoplastic radiation therapy: Secondary | ICD-10-CM | POA: Diagnosis not present

## 2022-12-19 DIAGNOSIS — C61 Malignant neoplasm of prostate: Secondary | ICD-10-CM | POA: Diagnosis not present

## 2022-12-19 DIAGNOSIS — Z87891 Personal history of nicotine dependence: Secondary | ICD-10-CM | POA: Diagnosis not present

## 2022-12-19 DIAGNOSIS — Z191 Hormone sensitive malignancy status: Secondary | ICD-10-CM | POA: Diagnosis not present

## 2022-12-19 LAB — RAD ONC ARIA SESSION SUMMARY
Course Elapsed Days: 9
Plan Fractions Treated to Date: 8
Plan Prescribed Dose Per Fraction: 2 Gy
Plan Total Fractions Prescribed: 40
Plan Total Prescribed Dose: 80 Gy
Reference Point Dosage Given to Date: 16 Gy
Reference Point Session Dosage Given: 2 Gy
Session Number: 8

## 2022-12-23 ENCOUNTER — Ambulatory Visit
Admission: RE | Admit: 2022-12-23 | Discharge: 2022-12-23 | Disposition: A | Discharge status: Home / Self Care | Payer: Medicare Other | Source: Ambulatory Visit | Attending: Radiation Oncology | Admitting: Radiation Oncology

## 2022-12-23 ENCOUNTER — Other Ambulatory Visit: Payer: Self-pay

## 2022-12-23 DIAGNOSIS — C61 Malignant neoplasm of prostate: Secondary | ICD-10-CM | POA: Diagnosis not present

## 2022-12-23 DIAGNOSIS — Z8673 Personal history of transient ischemic attack (TIA), and cerebral infarction without residual deficits: Secondary | ICD-10-CM | POA: Diagnosis not present

## 2022-12-23 DIAGNOSIS — Z87891 Personal history of nicotine dependence: Secondary | ICD-10-CM | POA: Diagnosis not present

## 2022-12-23 DIAGNOSIS — Z510 Encounter for antineoplastic radiation therapy: Secondary | ICD-10-CM | POA: Diagnosis not present

## 2022-12-23 DIAGNOSIS — Z191 Hormone sensitive malignancy status: Secondary | ICD-10-CM | POA: Diagnosis not present

## 2022-12-23 LAB — RAD ONC ARIA SESSION SUMMARY
Course Elapsed Days: 13
Plan Fractions Treated to Date: 9
Plan Prescribed Dose Per Fraction: 2 Gy
Plan Total Fractions Prescribed: 40
Plan Total Prescribed Dose: 80 Gy
Reference Point Dosage Given to Date: 18 Gy
Reference Point Session Dosage Given: 2 Gy
Session Number: 9

## 2022-12-24 ENCOUNTER — Other Ambulatory Visit: Payer: Self-pay

## 2022-12-24 ENCOUNTER — Ambulatory Visit
Admission: RE | Admit: 2022-12-24 | Discharge: 2022-12-24 | Disposition: A | Discharge status: Home / Self Care | Payer: Medicare Other | Source: Ambulatory Visit | Attending: Radiation Oncology | Admitting: Radiation Oncology

## 2022-12-24 DIAGNOSIS — Z510 Encounter for antineoplastic radiation therapy: Secondary | ICD-10-CM | POA: Diagnosis not present

## 2022-12-24 DIAGNOSIS — Z191 Hormone sensitive malignancy status: Secondary | ICD-10-CM | POA: Diagnosis not present

## 2022-12-24 DIAGNOSIS — C61 Malignant neoplasm of prostate: Secondary | ICD-10-CM | POA: Diagnosis not present

## 2022-12-24 DIAGNOSIS — Z87891 Personal history of nicotine dependence: Secondary | ICD-10-CM | POA: Diagnosis not present

## 2022-12-24 DIAGNOSIS — Z8673 Personal history of transient ischemic attack (TIA), and cerebral infarction without residual deficits: Secondary | ICD-10-CM | POA: Diagnosis not present

## 2022-12-24 LAB — RAD ONC ARIA SESSION SUMMARY
Course Elapsed Days: 14
Plan Fractions Treated to Date: 10
Plan Prescribed Dose Per Fraction: 2 Gy
Plan Total Fractions Prescribed: 40
Plan Total Prescribed Dose: 80 Gy
Reference Point Dosage Given to Date: 20 Gy
Reference Point Session Dosage Given: 2 Gy
Session Number: 10

## 2022-12-25 ENCOUNTER — Ambulatory Visit: Payer: Medicare Other

## 2022-12-26 ENCOUNTER — Ambulatory Visit: Payer: Medicare Other

## 2022-12-30 ENCOUNTER — Ambulatory Visit: Payer: Medicare Other

## 2022-12-30 ENCOUNTER — Inpatient Hospital Stay: Payer: Medicare Other | Attending: Oncology

## 2022-12-30 DIAGNOSIS — Z87891 Personal history of nicotine dependence: Secondary | ICD-10-CM | POA: Insufficient documentation

## 2022-12-30 DIAGNOSIS — Z8673 Personal history of transient ischemic attack (TIA), and cerebral infarction without residual deficits: Secondary | ICD-10-CM | POA: Insufficient documentation

## 2022-12-30 DIAGNOSIS — C61 Malignant neoplasm of prostate: Secondary | ICD-10-CM | POA: Insufficient documentation

## 2022-12-31 ENCOUNTER — Other Ambulatory Visit: Payer: Self-pay

## 2022-12-31 ENCOUNTER — Ambulatory Visit
Admission: RE | Admit: 2022-12-31 | Discharge: 2022-12-31 | Disposition: A | Discharge status: Home / Self Care | Payer: Medicare Other | Source: Ambulatory Visit | Attending: Radiation Oncology | Admitting: Radiation Oncology

## 2022-12-31 DIAGNOSIS — Z8673 Personal history of transient ischemic attack (TIA), and cerebral infarction without residual deficits: Secondary | ICD-10-CM | POA: Insufficient documentation

## 2022-12-31 DIAGNOSIS — Z191 Hormone sensitive malignancy status: Secondary | ICD-10-CM | POA: Diagnosis not present

## 2022-12-31 DIAGNOSIS — Z87891 Personal history of nicotine dependence: Secondary | ICD-10-CM | POA: Insufficient documentation

## 2022-12-31 DIAGNOSIS — C61 Malignant neoplasm of prostate: Secondary | ICD-10-CM | POA: Insufficient documentation

## 2022-12-31 DIAGNOSIS — Z510 Encounter for antineoplastic radiation therapy: Secondary | ICD-10-CM | POA: Diagnosis not present

## 2022-12-31 LAB — RAD ONC ARIA SESSION SUMMARY
Course Elapsed Days: 21
Plan Fractions Treated to Date: 11
Plan Prescribed Dose Per Fraction: 2 Gy
Plan Total Fractions Prescribed: 40
Plan Total Prescribed Dose: 80 Gy
Reference Point Dosage Given to Date: 22 Gy
Reference Point Session Dosage Given: 2 Gy
Session Number: 11

## 2023-01-01 ENCOUNTER — Ambulatory Visit
Admission: RE | Admit: 2023-01-01 | Discharge: 2023-01-01 | Disposition: A | Discharge status: Home / Self Care | Payer: Medicare Other | Source: Ambulatory Visit | Attending: Radiation Oncology | Admitting: Radiation Oncology

## 2023-01-01 ENCOUNTER — Other Ambulatory Visit: Payer: Self-pay

## 2023-01-01 DIAGNOSIS — Z191 Hormone sensitive malignancy status: Secondary | ICD-10-CM | POA: Diagnosis not present

## 2023-01-01 DIAGNOSIS — C61 Malignant neoplasm of prostate: Secondary | ICD-10-CM | POA: Diagnosis not present

## 2023-01-01 DIAGNOSIS — Z87891 Personal history of nicotine dependence: Secondary | ICD-10-CM | POA: Diagnosis not present

## 2023-01-01 DIAGNOSIS — Z8673 Personal history of transient ischemic attack (TIA), and cerebral infarction without residual deficits: Secondary | ICD-10-CM | POA: Diagnosis not present

## 2023-01-01 DIAGNOSIS — Z510 Encounter for antineoplastic radiation therapy: Secondary | ICD-10-CM | POA: Diagnosis not present

## 2023-01-01 LAB — RAD ONC ARIA SESSION SUMMARY
Course Elapsed Days: 22
Plan Fractions Treated to Date: 12
Plan Prescribed Dose Per Fraction: 2 Gy
Plan Total Fractions Prescribed: 40
Plan Total Prescribed Dose: 80 Gy
Reference Point Dosage Given to Date: 24 Gy
Reference Point Session Dosage Given: 2 Gy
Session Number: 12

## 2023-01-02 ENCOUNTER — Other Ambulatory Visit: Payer: Self-pay

## 2023-01-02 ENCOUNTER — Ambulatory Visit
Admission: RE | Admit: 2023-01-02 | Discharge: 2023-01-02 | Disposition: A | Discharge status: Home / Self Care | Payer: Medicare Other | Source: Ambulatory Visit | Attending: Radiation Oncology | Admitting: Radiation Oncology

## 2023-01-02 DIAGNOSIS — C61 Malignant neoplasm of prostate: Secondary | ICD-10-CM | POA: Diagnosis not present

## 2023-01-02 DIAGNOSIS — Z8673 Personal history of transient ischemic attack (TIA), and cerebral infarction without residual deficits: Secondary | ICD-10-CM | POA: Diagnosis not present

## 2023-01-02 DIAGNOSIS — Z510 Encounter for antineoplastic radiation therapy: Secondary | ICD-10-CM | POA: Diagnosis not present

## 2023-01-02 DIAGNOSIS — Z87891 Personal history of nicotine dependence: Secondary | ICD-10-CM | POA: Diagnosis not present

## 2023-01-02 DIAGNOSIS — Z191 Hormone sensitive malignancy status: Secondary | ICD-10-CM | POA: Diagnosis not present

## 2023-01-02 LAB — RAD ONC ARIA SESSION SUMMARY
Course Elapsed Days: 23
Plan Fractions Treated to Date: 13
Plan Prescribed Dose Per Fraction: 2 Gy
Plan Total Fractions Prescribed: 40
Plan Total Prescribed Dose: 80 Gy
Reference Point Dosage Given to Date: 26 Gy
Reference Point Session Dosage Given: 2 Gy
Session Number: 13

## 2023-01-05 ENCOUNTER — Ambulatory Visit
Admission: RE | Admit: 2023-01-05 | Discharge: 2023-01-05 | Disposition: A | Discharge status: Home / Self Care | Payer: Medicare Other | Source: Ambulatory Visit | Attending: Radiation Oncology | Admitting: Radiation Oncology

## 2023-01-05 ENCOUNTER — Other Ambulatory Visit: Payer: Self-pay

## 2023-01-05 DIAGNOSIS — Z191 Hormone sensitive malignancy status: Secondary | ICD-10-CM | POA: Diagnosis not present

## 2023-01-05 DIAGNOSIS — Z87891 Personal history of nicotine dependence: Secondary | ICD-10-CM | POA: Diagnosis not present

## 2023-01-05 DIAGNOSIS — Z510 Encounter for antineoplastic radiation therapy: Secondary | ICD-10-CM | POA: Diagnosis not present

## 2023-01-05 DIAGNOSIS — C61 Malignant neoplasm of prostate: Secondary | ICD-10-CM | POA: Diagnosis not present

## 2023-01-05 DIAGNOSIS — Z8673 Personal history of transient ischemic attack (TIA), and cerebral infarction without residual deficits: Secondary | ICD-10-CM | POA: Diagnosis not present

## 2023-01-05 LAB — RAD ONC ARIA SESSION SUMMARY
Course Elapsed Days: 26
Plan Fractions Treated to Date: 14
Plan Prescribed Dose Per Fraction: 2 Gy
Plan Total Fractions Prescribed: 40
Plan Total Prescribed Dose: 80 Gy
Reference Point Dosage Given to Date: 28 Gy
Reference Point Session Dosage Given: 2 Gy
Session Number: 14

## 2023-01-06 ENCOUNTER — Ambulatory Visit
Admission: RE | Admit: 2023-01-06 | Discharge: 2023-01-06 | Disposition: A | Discharge status: Home / Self Care | Payer: Medicare Other | Source: Ambulatory Visit | Attending: Radiation Oncology | Admitting: Radiation Oncology

## 2023-01-06 ENCOUNTER — Other Ambulatory Visit: Payer: Self-pay

## 2023-01-06 DIAGNOSIS — Z8673 Personal history of transient ischemic attack (TIA), and cerebral infarction without residual deficits: Secondary | ICD-10-CM | POA: Diagnosis not present

## 2023-01-06 DIAGNOSIS — C61 Malignant neoplasm of prostate: Secondary | ICD-10-CM | POA: Diagnosis not present

## 2023-01-06 DIAGNOSIS — Z510 Encounter for antineoplastic radiation therapy: Secondary | ICD-10-CM | POA: Diagnosis not present

## 2023-01-06 DIAGNOSIS — Z191 Hormone sensitive malignancy status: Secondary | ICD-10-CM | POA: Diagnosis not present

## 2023-01-06 DIAGNOSIS — Z87891 Personal history of nicotine dependence: Secondary | ICD-10-CM | POA: Diagnosis not present

## 2023-01-06 LAB — RAD ONC ARIA SESSION SUMMARY
Course Elapsed Days: 27
Plan Fractions Treated to Date: 15
Plan Prescribed Dose Per Fraction: 2 Gy
Plan Total Fractions Prescribed: 40
Plan Total Prescribed Dose: 80 Gy
Reference Point Dosage Given to Date: 30 Gy
Reference Point Session Dosage Given: 2 Gy
Session Number: 15

## 2023-01-07 ENCOUNTER — Ambulatory Visit
Admission: RE | Admit: 2023-01-07 | Discharge: 2023-01-07 | Disposition: A | Discharge status: Home / Self Care | Payer: Medicare Other | Source: Ambulatory Visit | Attending: Radiation Oncology | Admitting: Radiation Oncology

## 2023-01-07 ENCOUNTER — Other Ambulatory Visit: Payer: Self-pay

## 2023-01-07 DIAGNOSIS — Z191 Hormone sensitive malignancy status: Secondary | ICD-10-CM | POA: Diagnosis not present

## 2023-01-07 DIAGNOSIS — Z510 Encounter for antineoplastic radiation therapy: Secondary | ICD-10-CM | POA: Diagnosis not present

## 2023-01-07 DIAGNOSIS — C61 Malignant neoplasm of prostate: Secondary | ICD-10-CM | POA: Diagnosis not present

## 2023-01-07 DIAGNOSIS — Z8673 Personal history of transient ischemic attack (TIA), and cerebral infarction without residual deficits: Secondary | ICD-10-CM | POA: Diagnosis not present

## 2023-01-07 DIAGNOSIS — Z87891 Personal history of nicotine dependence: Secondary | ICD-10-CM | POA: Diagnosis not present

## 2023-01-07 LAB — RAD ONC ARIA SESSION SUMMARY
Course Elapsed Days: 28
Plan Fractions Treated to Date: 16
Plan Prescribed Dose Per Fraction: 2 Gy
Plan Total Fractions Prescribed: 40
Plan Total Prescribed Dose: 80 Gy
Reference Point Dosage Given to Date: 32 Gy
Reference Point Session Dosage Given: 2 Gy
Session Number: 16

## 2023-01-08 ENCOUNTER — Other Ambulatory Visit: Payer: Self-pay

## 2023-01-08 ENCOUNTER — Ambulatory Visit
Admission: RE | Admit: 2023-01-08 | Discharge: 2023-01-08 | Disposition: A | Discharge status: Home / Self Care | Payer: Medicare Other | Source: Ambulatory Visit | Attending: Radiation Oncology | Admitting: Radiation Oncology

## 2023-01-08 DIAGNOSIS — Z8673 Personal history of transient ischemic attack (TIA), and cerebral infarction without residual deficits: Secondary | ICD-10-CM | POA: Diagnosis not present

## 2023-01-08 DIAGNOSIS — Z87891 Personal history of nicotine dependence: Secondary | ICD-10-CM | POA: Diagnosis not present

## 2023-01-08 DIAGNOSIS — Z191 Hormone sensitive malignancy status: Secondary | ICD-10-CM | POA: Diagnosis not present

## 2023-01-08 DIAGNOSIS — Z510 Encounter for antineoplastic radiation therapy: Secondary | ICD-10-CM | POA: Diagnosis not present

## 2023-01-08 DIAGNOSIS — C61 Malignant neoplasm of prostate: Secondary | ICD-10-CM | POA: Diagnosis not present

## 2023-01-08 LAB — RAD ONC ARIA SESSION SUMMARY
Course Elapsed Days: 29
Plan Fractions Treated to Date: 17
Plan Prescribed Dose Per Fraction: 2 Gy
Plan Total Fractions Prescribed: 40
Plan Total Prescribed Dose: 80 Gy
Reference Point Dosage Given to Date: 34 Gy
Reference Point Session Dosage Given: 2 Gy
Session Number: 17

## 2023-01-09 ENCOUNTER — Ambulatory Visit: Payer: Medicare Other

## 2023-01-12 ENCOUNTER — Ambulatory Visit
Admission: RE | Admit: 2023-01-12 | Discharge: 2023-01-12 | Disposition: A | Discharge status: Home / Self Care | Payer: Medicare Other | Source: Ambulatory Visit | Attending: Radiation Oncology | Admitting: Radiation Oncology

## 2023-01-12 ENCOUNTER — Other Ambulatory Visit: Payer: Self-pay

## 2023-01-12 DIAGNOSIS — C61 Malignant neoplasm of prostate: Secondary | ICD-10-CM | POA: Diagnosis not present

## 2023-01-12 DIAGNOSIS — Z191 Hormone sensitive malignancy status: Secondary | ICD-10-CM | POA: Diagnosis not present

## 2023-01-12 DIAGNOSIS — Z510 Encounter for antineoplastic radiation therapy: Secondary | ICD-10-CM | POA: Diagnosis not present

## 2023-01-12 DIAGNOSIS — Z8673 Personal history of transient ischemic attack (TIA), and cerebral infarction without residual deficits: Secondary | ICD-10-CM | POA: Diagnosis not present

## 2023-01-12 DIAGNOSIS — Z87891 Personal history of nicotine dependence: Secondary | ICD-10-CM | POA: Diagnosis not present

## 2023-01-12 LAB — RAD ONC ARIA SESSION SUMMARY
Course Elapsed Days: 33
Plan Fractions Treated to Date: 18
Plan Prescribed Dose Per Fraction: 2 Gy
Plan Total Fractions Prescribed: 40
Plan Total Prescribed Dose: 80 Gy
Reference Point Dosage Given to Date: 36 Gy
Reference Point Session Dosage Given: 2 Gy
Session Number: 18

## 2023-01-13 ENCOUNTER — Ambulatory Visit
Admission: RE | Admit: 2023-01-13 | Discharge: 2023-01-13 | Disposition: A | Discharge status: Home / Self Care | Payer: Medicare Other | Source: Ambulatory Visit | Attending: Radiation Oncology | Admitting: Radiation Oncology

## 2023-01-13 ENCOUNTER — Inpatient Hospital Stay: Payer: Medicare Other

## 2023-01-13 ENCOUNTER — Other Ambulatory Visit: Payer: Self-pay

## 2023-01-13 DIAGNOSIS — C61 Malignant neoplasm of prostate: Secondary | ICD-10-CM | POA: Diagnosis not present

## 2023-01-13 DIAGNOSIS — Z87891 Personal history of nicotine dependence: Secondary | ICD-10-CM | POA: Diagnosis not present

## 2023-01-13 DIAGNOSIS — Z191 Hormone sensitive malignancy status: Secondary | ICD-10-CM | POA: Diagnosis not present

## 2023-01-13 DIAGNOSIS — Z510 Encounter for antineoplastic radiation therapy: Secondary | ICD-10-CM | POA: Diagnosis not present

## 2023-01-13 DIAGNOSIS — Z8673 Personal history of transient ischemic attack (TIA), and cerebral infarction without residual deficits: Secondary | ICD-10-CM | POA: Diagnosis not present

## 2023-01-13 LAB — RAD ONC ARIA SESSION SUMMARY
Course Elapsed Days: 34
Plan Fractions Treated to Date: 19
Plan Prescribed Dose Per Fraction: 2 Gy
Plan Total Fractions Prescribed: 40
Plan Total Prescribed Dose: 80 Gy
Reference Point Dosage Given to Date: 38 Gy
Reference Point Session Dosage Given: 2 Gy
Session Number: 19

## 2023-01-13 LAB — CBC
HCT: 35.3 % — ABNORMAL LOW (ref 39.0–52.0)
Hemoglobin: 11.4 g/dL — ABNORMAL LOW (ref 13.0–17.0)
MCH: 30.9 pg (ref 26.0–34.0)
MCHC: 32.3 g/dL (ref 30.0–36.0)
MCV: 95.7 fL (ref 80.0–100.0)
Platelets: 136 10*3/uL — ABNORMAL LOW (ref 150–400)
RBC: 3.69 MIL/uL — ABNORMAL LOW (ref 4.22–5.81)
RDW: 13.6 % (ref 11.5–15.5)
WBC: 3.5 10*3/uL — ABNORMAL LOW (ref 4.0–10.5)
nRBC: 0 % (ref 0.0–0.2)

## 2023-01-14 ENCOUNTER — Ambulatory Visit
Admission: RE | Admit: 2023-01-14 | Discharge: 2023-01-14 | Disposition: A | Discharge status: Home / Self Care | Payer: Medicare Other | Source: Ambulatory Visit | Attending: Radiation Oncology | Admitting: Radiation Oncology

## 2023-01-14 ENCOUNTER — Other Ambulatory Visit: Payer: Self-pay

## 2023-01-14 DIAGNOSIS — Z510 Encounter for antineoplastic radiation therapy: Secondary | ICD-10-CM | POA: Diagnosis not present

## 2023-01-14 DIAGNOSIS — Z8673 Personal history of transient ischemic attack (TIA), and cerebral infarction without residual deficits: Secondary | ICD-10-CM | POA: Diagnosis not present

## 2023-01-14 DIAGNOSIS — Z191 Hormone sensitive malignancy status: Secondary | ICD-10-CM | POA: Diagnosis not present

## 2023-01-14 DIAGNOSIS — Z87891 Personal history of nicotine dependence: Secondary | ICD-10-CM | POA: Diagnosis not present

## 2023-01-14 DIAGNOSIS — C61 Malignant neoplasm of prostate: Secondary | ICD-10-CM | POA: Diagnosis not present

## 2023-01-14 LAB — RAD ONC ARIA SESSION SUMMARY
Course Elapsed Days: 35
Plan Fractions Treated to Date: 20
Plan Prescribed Dose Per Fraction: 2 Gy
Plan Total Fractions Prescribed: 40
Plan Total Prescribed Dose: 80 Gy
Reference Point Dosage Given to Date: 40 Gy
Reference Point Session Dosage Given: 2 Gy
Session Number: 20

## 2023-01-15 ENCOUNTER — Other Ambulatory Visit: Payer: Self-pay

## 2023-01-15 ENCOUNTER — Ambulatory Visit
Admission: RE | Admit: 2023-01-15 | Discharge: 2023-01-15 | Disposition: A | Discharge status: Home / Self Care | Payer: Medicare Other | Source: Ambulatory Visit | Attending: Radiation Oncology | Admitting: Radiation Oncology

## 2023-01-15 DIAGNOSIS — Z8673 Personal history of transient ischemic attack (TIA), and cerebral infarction without residual deficits: Secondary | ICD-10-CM | POA: Diagnosis not present

## 2023-01-15 DIAGNOSIS — Z510 Encounter for antineoplastic radiation therapy: Secondary | ICD-10-CM | POA: Diagnosis not present

## 2023-01-15 DIAGNOSIS — C61 Malignant neoplasm of prostate: Secondary | ICD-10-CM | POA: Diagnosis not present

## 2023-01-15 DIAGNOSIS — Z87891 Personal history of nicotine dependence: Secondary | ICD-10-CM | POA: Diagnosis not present

## 2023-01-15 DIAGNOSIS — Z191 Hormone sensitive malignancy status: Secondary | ICD-10-CM | POA: Diagnosis not present

## 2023-01-15 LAB — RAD ONC ARIA SESSION SUMMARY
Course Elapsed Days: 36
Plan Fractions Treated to Date: 21
Plan Prescribed Dose Per Fraction: 2 Gy
Plan Total Fractions Prescribed: 40
Plan Total Prescribed Dose: 80 Gy
Reference Point Dosage Given to Date: 42 Gy
Reference Point Session Dosage Given: 2 Gy
Session Number: 21

## 2023-01-16 ENCOUNTER — Ambulatory Visit
Admission: RE | Admit: 2023-01-16 | Discharge: 2023-01-16 | Disposition: A | Discharge status: Home / Self Care | Payer: Medicare Other | Source: Ambulatory Visit | Attending: Radiation Oncology | Admitting: Radiation Oncology

## 2023-01-16 ENCOUNTER — Other Ambulatory Visit: Payer: Self-pay

## 2023-01-16 DIAGNOSIS — Z87891 Personal history of nicotine dependence: Secondary | ICD-10-CM | POA: Diagnosis not present

## 2023-01-16 DIAGNOSIS — Z510 Encounter for antineoplastic radiation therapy: Secondary | ICD-10-CM | POA: Diagnosis not present

## 2023-01-16 DIAGNOSIS — Z191 Hormone sensitive malignancy status: Secondary | ICD-10-CM | POA: Diagnosis not present

## 2023-01-16 DIAGNOSIS — C61 Malignant neoplasm of prostate: Secondary | ICD-10-CM | POA: Diagnosis not present

## 2023-01-16 DIAGNOSIS — Z8673 Personal history of transient ischemic attack (TIA), and cerebral infarction without residual deficits: Secondary | ICD-10-CM | POA: Diagnosis not present

## 2023-01-16 LAB — RAD ONC ARIA SESSION SUMMARY
Course Elapsed Days: 37
Plan Fractions Treated to Date: 22
Plan Prescribed Dose Per Fraction: 2 Gy
Plan Total Fractions Prescribed: 40
Plan Total Prescribed Dose: 80 Gy
Reference Point Dosage Given to Date: 44 Gy
Reference Point Session Dosage Given: 2 Gy
Session Number: 22

## 2023-01-19 ENCOUNTER — Ambulatory Visit
Admission: RE | Admit: 2023-01-19 | Discharge: 2023-01-19 | Disposition: A | Discharge status: Home / Self Care | Payer: Medicare Other | Source: Ambulatory Visit | Attending: Radiation Oncology | Admitting: Radiation Oncology

## 2023-01-19 ENCOUNTER — Other Ambulatory Visit: Payer: Self-pay

## 2023-01-19 DIAGNOSIS — Z510 Encounter for antineoplastic radiation therapy: Secondary | ICD-10-CM | POA: Diagnosis not present

## 2023-01-19 DIAGNOSIS — Z191 Hormone sensitive malignancy status: Secondary | ICD-10-CM | POA: Diagnosis not present

## 2023-01-19 DIAGNOSIS — Z8673 Personal history of transient ischemic attack (TIA), and cerebral infarction without residual deficits: Secondary | ICD-10-CM | POA: Diagnosis not present

## 2023-01-19 DIAGNOSIS — C61 Malignant neoplasm of prostate: Secondary | ICD-10-CM | POA: Diagnosis not present

## 2023-01-19 DIAGNOSIS — Z87891 Personal history of nicotine dependence: Secondary | ICD-10-CM | POA: Diagnosis not present

## 2023-01-19 LAB — RAD ONC ARIA SESSION SUMMARY
Course Elapsed Days: 40
Plan Fractions Treated to Date: 23
Plan Prescribed Dose Per Fraction: 2 Gy
Plan Total Fractions Prescribed: 40
Plan Total Prescribed Dose: 80 Gy
Reference Point Dosage Given to Date: 46 Gy
Reference Point Session Dosage Given: 2 Gy
Session Number: 23

## 2023-01-20 ENCOUNTER — Other Ambulatory Visit: Payer: Self-pay

## 2023-01-20 ENCOUNTER — Ambulatory Visit: Payer: Medicare Other

## 2023-01-20 ENCOUNTER — Encounter: Payer: Self-pay | Admitting: Family Medicine

## 2023-01-20 ENCOUNTER — Ambulatory Visit
Admission: RE | Admit: 2023-01-20 | Discharge: 2023-01-20 | Disposition: A | Discharge status: Home / Self Care | Payer: Medicare Other | Source: Ambulatory Visit | Attending: Radiation Oncology | Admitting: Radiation Oncology

## 2023-01-20 DIAGNOSIS — Z510 Encounter for antineoplastic radiation therapy: Secondary | ICD-10-CM | POA: Diagnosis not present

## 2023-01-20 DIAGNOSIS — Z8673 Personal history of transient ischemic attack (TIA), and cerebral infarction without residual deficits: Secondary | ICD-10-CM | POA: Diagnosis not present

## 2023-01-20 DIAGNOSIS — Z191 Hormone sensitive malignancy status: Secondary | ICD-10-CM | POA: Diagnosis not present

## 2023-01-20 DIAGNOSIS — Z87891 Personal history of nicotine dependence: Secondary | ICD-10-CM | POA: Diagnosis not present

## 2023-01-20 DIAGNOSIS — C61 Malignant neoplasm of prostate: Secondary | ICD-10-CM | POA: Diagnosis not present

## 2023-01-20 LAB — RAD ONC ARIA SESSION SUMMARY
Course Elapsed Days: 41
Plan Fractions Treated to Date: 24
Plan Prescribed Dose Per Fraction: 2 Gy
Plan Total Fractions Prescribed: 40
Plan Total Prescribed Dose: 80 Gy
Reference Point Dosage Given to Date: 48 Gy
Reference Point Session Dosage Given: 2 Gy
Session Number: 24

## 2023-01-21 ENCOUNTER — Ambulatory Visit
Admission: RE | Admit: 2023-01-21 | Discharge: 2023-01-21 | Disposition: A | Discharge status: Home / Self Care | Payer: Medicare Other | Source: Ambulatory Visit | Attending: Radiation Oncology | Admitting: Radiation Oncology

## 2023-01-21 ENCOUNTER — Other Ambulatory Visit: Payer: Self-pay

## 2023-01-21 DIAGNOSIS — Z191 Hormone sensitive malignancy status: Secondary | ICD-10-CM | POA: Diagnosis not present

## 2023-01-21 DIAGNOSIS — Z8673 Personal history of transient ischemic attack (TIA), and cerebral infarction without residual deficits: Secondary | ICD-10-CM | POA: Diagnosis not present

## 2023-01-21 DIAGNOSIS — C61 Malignant neoplasm of prostate: Secondary | ICD-10-CM | POA: Diagnosis not present

## 2023-01-21 DIAGNOSIS — Z87891 Personal history of nicotine dependence: Secondary | ICD-10-CM | POA: Diagnosis not present

## 2023-01-21 DIAGNOSIS — Z510 Encounter for antineoplastic radiation therapy: Secondary | ICD-10-CM | POA: Diagnosis not present

## 2023-01-21 LAB — RAD ONC ARIA SESSION SUMMARY
Course Elapsed Days: 42
Plan Fractions Treated to Date: 25
Plan Prescribed Dose Per Fraction: 2 Gy
Plan Total Fractions Prescribed: 40
Plan Total Prescribed Dose: 80 Gy
Reference Point Dosage Given to Date: 50 Gy
Reference Point Session Dosage Given: 2 Gy
Session Number: 25

## 2023-01-22 ENCOUNTER — Other Ambulatory Visit: Payer: Self-pay

## 2023-01-22 ENCOUNTER — Ambulatory Visit
Admission: RE | Admit: 2023-01-22 | Discharge: 2023-01-22 | Disposition: A | Discharge status: Home / Self Care | Payer: Medicare Other | Source: Ambulatory Visit | Attending: Radiation Oncology | Admitting: Radiation Oncology

## 2023-01-22 DIAGNOSIS — Z191 Hormone sensitive malignancy status: Secondary | ICD-10-CM | POA: Diagnosis not present

## 2023-01-22 DIAGNOSIS — C61 Malignant neoplasm of prostate: Secondary | ICD-10-CM | POA: Diagnosis not present

## 2023-01-22 DIAGNOSIS — Z87891 Personal history of nicotine dependence: Secondary | ICD-10-CM | POA: Diagnosis not present

## 2023-01-22 DIAGNOSIS — Z8673 Personal history of transient ischemic attack (TIA), and cerebral infarction without residual deficits: Secondary | ICD-10-CM | POA: Diagnosis not present

## 2023-01-22 DIAGNOSIS — Z510 Encounter for antineoplastic radiation therapy: Secondary | ICD-10-CM | POA: Diagnosis not present

## 2023-01-22 LAB — RAD ONC ARIA SESSION SUMMARY
Course Elapsed Days: 43
Plan Fractions Treated to Date: 26
Plan Prescribed Dose Per Fraction: 2 Gy
Plan Total Fractions Prescribed: 40
Plan Total Prescribed Dose: 80 Gy
Reference Point Dosage Given to Date: 52 Gy
Reference Point Session Dosage Given: 2 Gy
Session Number: 26

## 2023-01-23 ENCOUNTER — Ambulatory Visit
Admission: RE | Admit: 2023-01-23 | Discharge: 2023-01-23 | Disposition: A | Discharge status: Home / Self Care | Payer: Medicare Other | Source: Ambulatory Visit | Attending: Radiation Oncology | Admitting: Radiation Oncology

## 2023-01-23 ENCOUNTER — Other Ambulatory Visit: Payer: Self-pay

## 2023-01-23 DIAGNOSIS — C61 Malignant neoplasm of prostate: Secondary | ICD-10-CM | POA: Diagnosis not present

## 2023-01-23 DIAGNOSIS — Z191 Hormone sensitive malignancy status: Secondary | ICD-10-CM | POA: Diagnosis not present

## 2023-01-23 DIAGNOSIS — Z8673 Personal history of transient ischemic attack (TIA), and cerebral infarction without residual deficits: Secondary | ICD-10-CM | POA: Diagnosis not present

## 2023-01-23 DIAGNOSIS — Z87891 Personal history of nicotine dependence: Secondary | ICD-10-CM | POA: Diagnosis not present

## 2023-01-23 DIAGNOSIS — Z510 Encounter for antineoplastic radiation therapy: Secondary | ICD-10-CM | POA: Diagnosis not present

## 2023-01-23 LAB — RAD ONC ARIA SESSION SUMMARY
Course Elapsed Days: 44
Plan Fractions Treated to Date: 27
Plan Prescribed Dose Per Fraction: 2 Gy
Plan Total Fractions Prescribed: 40
Plan Total Prescribed Dose: 80 Gy
Reference Point Dosage Given to Date: 54 Gy
Reference Point Session Dosage Given: 2 Gy
Session Number: 27

## 2023-01-26 ENCOUNTER — Ambulatory Visit
Admission: RE | Admit: 2023-01-26 | Discharge: 2023-01-26 | Disposition: A | Discharge status: Home / Self Care | Payer: Medicare Other | Source: Ambulatory Visit | Attending: Radiation Oncology | Admitting: Radiation Oncology

## 2023-01-26 ENCOUNTER — Other Ambulatory Visit: Payer: Self-pay

## 2023-01-26 DIAGNOSIS — Z191 Hormone sensitive malignancy status: Secondary | ICD-10-CM | POA: Diagnosis not present

## 2023-01-26 DIAGNOSIS — Z510 Encounter for antineoplastic radiation therapy: Secondary | ICD-10-CM | POA: Diagnosis not present

## 2023-01-26 DIAGNOSIS — Z87891 Personal history of nicotine dependence: Secondary | ICD-10-CM | POA: Diagnosis not present

## 2023-01-26 DIAGNOSIS — C61 Malignant neoplasm of prostate: Secondary | ICD-10-CM | POA: Diagnosis not present

## 2023-01-26 DIAGNOSIS — Z8673 Personal history of transient ischemic attack (TIA), and cerebral infarction without residual deficits: Secondary | ICD-10-CM | POA: Diagnosis not present

## 2023-01-26 LAB — RAD ONC ARIA SESSION SUMMARY
Course Elapsed Days: 47
Plan Fractions Treated to Date: 28
Plan Prescribed Dose Per Fraction: 2 Gy
Plan Total Fractions Prescribed: 40
Plan Total Prescribed Dose: 80 Gy
Reference Point Dosage Given to Date: 56 Gy
Reference Point Session Dosage Given: 2 Gy
Session Number: 28

## 2023-01-27 ENCOUNTER — Inpatient Hospital Stay: Payer: Medicare Other

## 2023-01-27 ENCOUNTER — Ambulatory Visit
Admission: RE | Admit: 2023-01-27 | Discharge: 2023-01-27 | Disposition: A | Discharge status: Home / Self Care | Payer: Medicare Other | Source: Ambulatory Visit | Attending: Radiation Oncology | Admitting: Radiation Oncology

## 2023-01-27 ENCOUNTER — Other Ambulatory Visit: Payer: Self-pay

## 2023-01-27 DIAGNOSIS — Z8673 Personal history of transient ischemic attack (TIA), and cerebral infarction without residual deficits: Secondary | ICD-10-CM | POA: Diagnosis not present

## 2023-01-27 DIAGNOSIS — Z87891 Personal history of nicotine dependence: Secondary | ICD-10-CM | POA: Diagnosis not present

## 2023-01-27 DIAGNOSIS — Z191 Hormone sensitive malignancy status: Secondary | ICD-10-CM | POA: Diagnosis not present

## 2023-01-27 DIAGNOSIS — Z510 Encounter for antineoplastic radiation therapy: Secondary | ICD-10-CM | POA: Diagnosis not present

## 2023-01-27 DIAGNOSIS — C61 Malignant neoplasm of prostate: Secondary | ICD-10-CM | POA: Diagnosis not present

## 2023-01-27 LAB — RAD ONC ARIA SESSION SUMMARY
Course Elapsed Days: 48
Plan Fractions Treated to Date: 29
Plan Prescribed Dose Per Fraction: 2 Gy
Plan Total Fractions Prescribed: 40
Plan Total Prescribed Dose: 80 Gy
Reference Point Dosage Given to Date: 58 Gy
Reference Point Session Dosage Given: 2 Gy
Session Number: 29

## 2023-01-27 LAB — CBC
HCT: 34.1 % — ABNORMAL LOW (ref 39.0–52.0)
Hemoglobin: 11 g/dL — ABNORMAL LOW (ref 13.0–17.0)
MCH: 30.6 pg (ref 26.0–34.0)
MCHC: 32.3 g/dL (ref 30.0–36.0)
MCV: 95 fL (ref 80.0–100.0)
Platelets: 107 10*3/uL — ABNORMAL LOW (ref 150–400)
RBC: 3.59 MIL/uL — ABNORMAL LOW (ref 4.22–5.81)
RDW: 14 % (ref 11.5–15.5)
WBC: 2.8 10*3/uL — ABNORMAL LOW (ref 4.0–10.5)
nRBC: 0 % (ref 0.0–0.2)

## 2023-01-28 ENCOUNTER — Other Ambulatory Visit: Payer: Self-pay

## 2023-01-28 ENCOUNTER — Ambulatory Visit
Admission: RE | Admit: 2023-01-28 | Discharge: 2023-01-28 | Disposition: A | Discharge status: Home / Self Care | Payer: Medicare Other | Source: Ambulatory Visit | Attending: Radiation Oncology | Admitting: Radiation Oncology

## 2023-01-28 DIAGNOSIS — Z8673 Personal history of transient ischemic attack (TIA), and cerebral infarction without residual deficits: Secondary | ICD-10-CM | POA: Diagnosis not present

## 2023-01-28 DIAGNOSIS — Z510 Encounter for antineoplastic radiation therapy: Secondary | ICD-10-CM | POA: Diagnosis not present

## 2023-01-28 DIAGNOSIS — Z87891 Personal history of nicotine dependence: Secondary | ICD-10-CM | POA: Diagnosis not present

## 2023-01-28 DIAGNOSIS — Z191 Hormone sensitive malignancy status: Secondary | ICD-10-CM | POA: Diagnosis not present

## 2023-01-28 DIAGNOSIS — C61 Malignant neoplasm of prostate: Secondary | ICD-10-CM | POA: Diagnosis not present

## 2023-01-28 LAB — RAD ONC ARIA SESSION SUMMARY
Course Elapsed Days: 49
Plan Fractions Treated to Date: 30
Plan Prescribed Dose Per Fraction: 2 Gy
Plan Total Fractions Prescribed: 40
Plan Total Prescribed Dose: 80 Gy
Reference Point Dosage Given to Date: 60 Gy
Reference Point Session Dosage Given: 2 Gy
Session Number: 30

## 2023-01-29 ENCOUNTER — Other Ambulatory Visit: Payer: Self-pay

## 2023-01-29 ENCOUNTER — Ambulatory Visit
Admission: RE | Admit: 2023-01-29 | Discharge: 2023-01-29 | Disposition: A | Discharge status: Home / Self Care | Payer: Medicare Other | Source: Ambulatory Visit | Attending: Radiation Oncology | Admitting: Radiation Oncology

## 2023-01-29 DIAGNOSIS — Z87891 Personal history of nicotine dependence: Secondary | ICD-10-CM | POA: Insufficient documentation

## 2023-01-29 DIAGNOSIS — Z510 Encounter for antineoplastic radiation therapy: Secondary | ICD-10-CM | POA: Diagnosis not present

## 2023-01-29 DIAGNOSIS — Z8673 Personal history of transient ischemic attack (TIA), and cerebral infarction without residual deficits: Secondary | ICD-10-CM | POA: Insufficient documentation

## 2023-01-29 DIAGNOSIS — Z191 Hormone sensitive malignancy status: Secondary | ICD-10-CM | POA: Diagnosis not present

## 2023-01-29 DIAGNOSIS — C61 Malignant neoplasm of prostate: Secondary | ICD-10-CM | POA: Insufficient documentation

## 2023-01-29 LAB — RAD ONC ARIA SESSION SUMMARY
Course Elapsed Days: 50
Plan Fractions Treated to Date: 31
Plan Prescribed Dose Per Fraction: 2 Gy
Plan Total Fractions Prescribed: 40
Plan Total Prescribed Dose: 80 Gy
Reference Point Dosage Given to Date: 62 Gy
Reference Point Session Dosage Given: 2 Gy
Session Number: 31

## 2023-01-30 ENCOUNTER — Other Ambulatory Visit: Payer: Self-pay

## 2023-01-30 ENCOUNTER — Ambulatory Visit
Admission: RE | Admit: 2023-01-30 | Discharge: 2023-01-30 | Disposition: A | Discharge status: Home / Self Care | Payer: Medicare Other | Source: Ambulatory Visit | Attending: Radiation Oncology | Admitting: Radiation Oncology

## 2023-01-30 DIAGNOSIS — Z510 Encounter for antineoplastic radiation therapy: Secondary | ICD-10-CM | POA: Diagnosis not present

## 2023-01-30 DIAGNOSIS — Z191 Hormone sensitive malignancy status: Secondary | ICD-10-CM | POA: Diagnosis not present

## 2023-01-30 DIAGNOSIS — Z87891 Personal history of nicotine dependence: Secondary | ICD-10-CM | POA: Diagnosis not present

## 2023-01-30 DIAGNOSIS — Z8673 Personal history of transient ischemic attack (TIA), and cerebral infarction without residual deficits: Secondary | ICD-10-CM | POA: Diagnosis not present

## 2023-01-30 DIAGNOSIS — C61 Malignant neoplasm of prostate: Secondary | ICD-10-CM | POA: Diagnosis not present

## 2023-01-30 LAB — RAD ONC ARIA SESSION SUMMARY
Course Elapsed Days: 51
Plan Fractions Treated to Date: 32
Plan Prescribed Dose Per Fraction: 2 Gy
Plan Total Fractions Prescribed: 40
Plan Total Prescribed Dose: 80 Gy
Reference Point Dosage Given to Date: 64 Gy
Reference Point Session Dosage Given: 2 Gy
Session Number: 32

## 2023-02-02 ENCOUNTER — Other Ambulatory Visit: Payer: Self-pay

## 2023-02-02 ENCOUNTER — Ambulatory Visit
Admission: RE | Admit: 2023-02-02 | Discharge: 2023-02-02 | Disposition: A | Discharge status: Home / Self Care | Payer: Medicare Other | Source: Ambulatory Visit | Attending: Radiation Oncology | Admitting: Radiation Oncology

## 2023-02-02 DIAGNOSIS — Z8673 Personal history of transient ischemic attack (TIA), and cerebral infarction without residual deficits: Secondary | ICD-10-CM | POA: Diagnosis not present

## 2023-02-02 DIAGNOSIS — Z191 Hormone sensitive malignancy status: Secondary | ICD-10-CM | POA: Diagnosis not present

## 2023-02-02 DIAGNOSIS — C61 Malignant neoplasm of prostate: Secondary | ICD-10-CM | POA: Diagnosis not present

## 2023-02-02 DIAGNOSIS — Z87891 Personal history of nicotine dependence: Secondary | ICD-10-CM | POA: Diagnosis not present

## 2023-02-02 DIAGNOSIS — Z510 Encounter for antineoplastic radiation therapy: Secondary | ICD-10-CM | POA: Diagnosis not present

## 2023-02-02 LAB — RAD ONC ARIA SESSION SUMMARY
Course Elapsed Days: 54
Plan Fractions Treated to Date: 33
Plan Prescribed Dose Per Fraction: 2 Gy
Plan Total Fractions Prescribed: 40
Plan Total Prescribed Dose: 80 Gy
Reference Point Dosage Given to Date: 66 Gy
Reference Point Session Dosage Given: 2 Gy
Session Number: 33

## 2023-02-03 ENCOUNTER — Other Ambulatory Visit: Payer: Self-pay

## 2023-02-03 ENCOUNTER — Ambulatory Visit
Admission: RE | Admit: 2023-02-03 | Discharge: 2023-02-03 | Disposition: A | Discharge status: Home / Self Care | Payer: Medicare Other | Source: Ambulatory Visit | Attending: Radiation Oncology | Admitting: Radiation Oncology

## 2023-02-03 DIAGNOSIS — Z87891 Personal history of nicotine dependence: Secondary | ICD-10-CM | POA: Diagnosis not present

## 2023-02-03 DIAGNOSIS — Z8673 Personal history of transient ischemic attack (TIA), and cerebral infarction without residual deficits: Secondary | ICD-10-CM | POA: Diagnosis not present

## 2023-02-03 DIAGNOSIS — C61 Malignant neoplasm of prostate: Secondary | ICD-10-CM | POA: Diagnosis not present

## 2023-02-03 DIAGNOSIS — Z191 Hormone sensitive malignancy status: Secondary | ICD-10-CM | POA: Diagnosis not present

## 2023-02-03 DIAGNOSIS — Z510 Encounter for antineoplastic radiation therapy: Secondary | ICD-10-CM | POA: Diagnosis not present

## 2023-02-03 LAB — RAD ONC ARIA SESSION SUMMARY
Course Elapsed Days: 55
Plan Fractions Treated to Date: 34
Plan Prescribed Dose Per Fraction: 2 Gy
Plan Total Fractions Prescribed: 40
Plan Total Prescribed Dose: 80 Gy
Reference Point Dosage Given to Date: 68 Gy
Reference Point Session Dosage Given: 2 Gy
Session Number: 34

## 2023-02-04 ENCOUNTER — Ambulatory Visit
Admission: RE | Admit: 2023-02-04 | Discharge: 2023-02-04 | Disposition: A | Discharge status: Home / Self Care | Payer: Medicare Other | Source: Ambulatory Visit | Attending: Radiation Oncology | Admitting: Radiation Oncology

## 2023-02-04 ENCOUNTER — Other Ambulatory Visit: Payer: Self-pay

## 2023-02-04 DIAGNOSIS — Z191 Hormone sensitive malignancy status: Secondary | ICD-10-CM | POA: Diagnosis not present

## 2023-02-04 DIAGNOSIS — Z87891 Personal history of nicotine dependence: Secondary | ICD-10-CM | POA: Diagnosis not present

## 2023-02-04 DIAGNOSIS — Z510 Encounter for antineoplastic radiation therapy: Secondary | ICD-10-CM | POA: Diagnosis not present

## 2023-02-04 DIAGNOSIS — Z8673 Personal history of transient ischemic attack (TIA), and cerebral infarction without residual deficits: Secondary | ICD-10-CM | POA: Diagnosis not present

## 2023-02-04 DIAGNOSIS — C61 Malignant neoplasm of prostate: Secondary | ICD-10-CM | POA: Diagnosis not present

## 2023-02-04 LAB — RAD ONC ARIA SESSION SUMMARY
Course Elapsed Days: 56
Plan Fractions Treated to Date: 35
Plan Prescribed Dose Per Fraction: 2 Gy
Plan Total Fractions Prescribed: 40
Plan Total Prescribed Dose: 80 Gy
Reference Point Dosage Given to Date: 70 Gy
Reference Point Session Dosage Given: 2 Gy
Session Number: 35

## 2023-02-05 ENCOUNTER — Ambulatory Visit
Admission: RE | Admit: 2023-02-05 | Discharge: 2023-02-05 | Disposition: A | Discharge status: Home / Self Care | Payer: Medicare Other | Source: Ambulatory Visit | Attending: Radiation Oncology | Admitting: Radiation Oncology

## 2023-02-05 ENCOUNTER — Other Ambulatory Visit: Payer: Self-pay

## 2023-02-05 ENCOUNTER — Ambulatory Visit: Payer: Medicare Other

## 2023-02-05 DIAGNOSIS — C61 Malignant neoplasm of prostate: Secondary | ICD-10-CM | POA: Diagnosis not present

## 2023-02-05 DIAGNOSIS — Z8673 Personal history of transient ischemic attack (TIA), and cerebral infarction without residual deficits: Secondary | ICD-10-CM | POA: Diagnosis not present

## 2023-02-05 DIAGNOSIS — Z510 Encounter for antineoplastic radiation therapy: Secondary | ICD-10-CM | POA: Diagnosis not present

## 2023-02-05 DIAGNOSIS — Z191 Hormone sensitive malignancy status: Secondary | ICD-10-CM | POA: Diagnosis not present

## 2023-02-05 DIAGNOSIS — Z87891 Personal history of nicotine dependence: Secondary | ICD-10-CM | POA: Diagnosis not present

## 2023-02-05 LAB — RAD ONC ARIA SESSION SUMMARY
Course Elapsed Days: 57
Plan Fractions Treated to Date: 36
Plan Prescribed Dose Per Fraction: 2 Gy
Plan Total Fractions Prescribed: 40
Plan Total Prescribed Dose: 80 Gy
Reference Point Dosage Given to Date: 72 Gy
Reference Point Session Dosage Given: 2 Gy
Session Number: 36

## 2023-02-06 ENCOUNTER — Ambulatory Visit: Payer: Medicare Other

## 2023-02-06 ENCOUNTER — Other Ambulatory Visit: Payer: Self-pay

## 2023-02-06 ENCOUNTER — Ambulatory Visit
Admission: RE | Admit: 2023-02-06 | Discharge: 2023-02-06 | Disposition: A | Discharge status: Home / Self Care | Payer: Medicare Other | Source: Ambulatory Visit | Attending: Radiation Oncology | Admitting: Radiation Oncology

## 2023-02-06 DIAGNOSIS — Z87891 Personal history of nicotine dependence: Secondary | ICD-10-CM | POA: Diagnosis not present

## 2023-02-06 DIAGNOSIS — Z510 Encounter for antineoplastic radiation therapy: Secondary | ICD-10-CM | POA: Diagnosis not present

## 2023-02-06 DIAGNOSIS — C61 Malignant neoplasm of prostate: Secondary | ICD-10-CM | POA: Diagnosis not present

## 2023-02-06 DIAGNOSIS — Z191 Hormone sensitive malignancy status: Secondary | ICD-10-CM | POA: Diagnosis not present

## 2023-02-06 DIAGNOSIS — Z8673 Personal history of transient ischemic attack (TIA), and cerebral infarction without residual deficits: Secondary | ICD-10-CM | POA: Diagnosis not present

## 2023-02-06 LAB — RAD ONC ARIA SESSION SUMMARY
Course Elapsed Days: 58
Plan Fractions Treated to Date: 37
Plan Prescribed Dose Per Fraction: 2 Gy
Plan Total Fractions Prescribed: 40
Plan Total Prescribed Dose: 80 Gy
Reference Point Dosage Given to Date: 74 Gy
Reference Point Session Dosage Given: 2 Gy
Session Number: 37

## 2023-02-09 ENCOUNTER — Ambulatory Visit
Admission: RE | Admit: 2023-02-09 | Discharge: 2023-02-09 | Disposition: A | Discharge status: Home / Self Care | Payer: Medicare Other | Source: Ambulatory Visit | Attending: Radiation Oncology | Admitting: Radiation Oncology

## 2023-02-09 ENCOUNTER — Other Ambulatory Visit: Payer: Self-pay

## 2023-02-09 ENCOUNTER — Ambulatory Visit: Payer: Medicare Other

## 2023-02-09 DIAGNOSIS — Z87891 Personal history of nicotine dependence: Secondary | ICD-10-CM | POA: Diagnosis not present

## 2023-02-09 DIAGNOSIS — Z8673 Personal history of transient ischemic attack (TIA), and cerebral infarction without residual deficits: Secondary | ICD-10-CM | POA: Diagnosis not present

## 2023-02-09 DIAGNOSIS — Z510 Encounter for antineoplastic radiation therapy: Secondary | ICD-10-CM | POA: Diagnosis not present

## 2023-02-09 DIAGNOSIS — Z191 Hormone sensitive malignancy status: Secondary | ICD-10-CM | POA: Diagnosis not present

## 2023-02-09 DIAGNOSIS — C61 Malignant neoplasm of prostate: Secondary | ICD-10-CM | POA: Diagnosis not present

## 2023-02-09 LAB — RAD ONC ARIA SESSION SUMMARY
Course Elapsed Days: 61
Plan Fractions Treated to Date: 38
Plan Prescribed Dose Per Fraction: 2 Gy
Plan Total Fractions Prescribed: 40
Plan Total Prescribed Dose: 80 Gy
Reference Point Dosage Given to Date: 76 Gy
Reference Point Session Dosage Given: 2 Gy
Session Number: 38

## 2023-02-10 ENCOUNTER — Ambulatory Visit
Admission: RE | Admit: 2023-02-10 | Discharge: 2023-02-10 | Disposition: A | Discharge status: Home / Self Care | Payer: Medicare Other | Source: Ambulatory Visit | Attending: Radiation Oncology | Admitting: Radiation Oncology

## 2023-02-10 ENCOUNTER — Ambulatory Visit: Payer: Medicare Other

## 2023-02-10 ENCOUNTER — Other Ambulatory Visit: Payer: Self-pay

## 2023-02-10 DIAGNOSIS — Z191 Hormone sensitive malignancy status: Secondary | ICD-10-CM | POA: Diagnosis not present

## 2023-02-10 DIAGNOSIS — Z8673 Personal history of transient ischemic attack (TIA), and cerebral infarction without residual deficits: Secondary | ICD-10-CM | POA: Diagnosis not present

## 2023-02-10 DIAGNOSIS — C61 Malignant neoplasm of prostate: Secondary | ICD-10-CM | POA: Diagnosis not present

## 2023-02-10 DIAGNOSIS — Z510 Encounter for antineoplastic radiation therapy: Secondary | ICD-10-CM | POA: Diagnosis not present

## 2023-02-10 DIAGNOSIS — Z87891 Personal history of nicotine dependence: Secondary | ICD-10-CM | POA: Diagnosis not present

## 2023-02-10 LAB — RAD ONC ARIA SESSION SUMMARY
Course Elapsed Days: 62
Plan Fractions Treated to Date: 39
Plan Prescribed Dose Per Fraction: 2 Gy
Plan Total Fractions Prescribed: 40
Plan Total Prescribed Dose: 80 Gy
Reference Point Dosage Given to Date: 78 Gy
Reference Point Session Dosage Given: 2 Gy
Session Number: 39

## 2023-02-11 ENCOUNTER — Ambulatory Visit
Admission: RE | Admit: 2023-02-11 | Discharge: 2023-02-11 | Disposition: A | Discharge status: Home / Self Care | Payer: Medicare Other | Source: Ambulatory Visit | Attending: Radiation Oncology | Admitting: Radiation Oncology

## 2023-02-11 ENCOUNTER — Other Ambulatory Visit: Payer: Self-pay

## 2023-02-11 DIAGNOSIS — Z510 Encounter for antineoplastic radiation therapy: Secondary | ICD-10-CM | POA: Diagnosis not present

## 2023-02-11 DIAGNOSIS — C61 Malignant neoplasm of prostate: Secondary | ICD-10-CM | POA: Diagnosis not present

## 2023-02-11 DIAGNOSIS — Z8673 Personal history of transient ischemic attack (TIA), and cerebral infarction without residual deficits: Secondary | ICD-10-CM | POA: Diagnosis not present

## 2023-02-11 DIAGNOSIS — Z87891 Personal history of nicotine dependence: Secondary | ICD-10-CM | POA: Diagnosis not present

## 2023-02-11 DIAGNOSIS — Z191 Hormone sensitive malignancy status: Secondary | ICD-10-CM | POA: Diagnosis not present

## 2023-02-11 LAB — RAD ONC ARIA SESSION SUMMARY
Course Elapsed Days: 63
Plan Fractions Treated to Date: 40
Plan Prescribed Dose Per Fraction: 2 Gy
Plan Total Fractions Prescribed: 40
Plan Total Prescribed Dose: 80 Gy
Reference Point Dosage Given to Date: 80 Gy
Reference Point Session Dosage Given: 2 Gy
Session Number: 40

## 2023-03-12 ENCOUNTER — Encounter: Payer: Self-pay | Admitting: Radiation Oncology

## 2023-03-12 ENCOUNTER — Ambulatory Visit
Admission: RE | Admit: 2023-03-12 | Discharge: 2023-03-12 | Disposition: A | Discharge status: Home / Self Care | Payer: Medicare Other | Source: Ambulatory Visit | Attending: Radiation Oncology | Admitting: Radiation Oncology

## 2023-03-12 VITALS — BP 140/72 | HR 76 | Temp 98.5°F | Resp 16 | Wt 172.0 lb

## 2023-03-12 DIAGNOSIS — C61 Malignant neoplasm of prostate: Secondary | ICD-10-CM | POA: Diagnosis not present

## 2023-03-12 NOTE — Progress Notes (Signed)
Radiation Oncology Follow up Note  Name: Dean Larsen   Date:   03/12/2023 MRN:  [id number] DOB: 02-23-1946    This 77 y.o. male presents to the clinic today for 1 month follow-up status post IMRT radiation therapy for stage IIIb (T3a N0 M0) Gleason 7 (3+4) adenocarcinoma the prostate presenting with a PSA of 21.  REFERRING PROVIDER: Valerie Roys, DO  HPI: Patient is a 77 year old male now out 1 month having completed radiation therapy to both his prostate and pelvic nodes for Gleason 7 adenocarcinoma prostate presenting with a PSA in the 21 range.  Seen today in routine follow-up he is doing well specifically denies any increased lower urinary tract symptoms diarrhea or fatigue..  COMPLICATIONS OF TREATMENT: none  FOLLOW UP COMPLIANCE: keeps appointments   PHYSICAL EXAM:  BP (!) 140/72 (BP Location: Left Arm, Patient Position: Sitting, Cuff Size: Normal)   Pulse 76   Temp 98.5 F (36.9 C) (Tympanic)   Resp 16   Wt 172 lb (78 kg)   BMI 30.47 kg/m  Well-developed well-nourished patient in NAD. HEENT reveals PERLA, EOMI, discs not visualized.  Oral cavity is clear. No oral mucosal lesions are identified. Neck is clear without evidence of cervical or supraclavicular adenopathy. Lungs are clear to A&P. Cardiac examination is essentially unremarkable with regular rate and rhythm without murmur rub or thrill. Abdomen is benign with no organomegaly or masses noted. Motor sensory and DTR levels are equal and symmetric in the upper and lower extremities. Cranial nerves II through XII are grossly intact. Proprioception is intact. No peripheral adenopathy or edema is identified. No motor or sensory levels are noted. Crude visual fields are within normal range.  RADIOLOGY RESULTS: No current films for review  PLAN: Present time patient is doing well very low side effect profile.  I have asked to see him back in 3 months for follow-up with a PSA prior to that visit.  Patient is to call with  any concerns.  He is doing clinically well.  I would like to take this opportunity to thank you for allowing me to participate in the care of your patient.Noreene Filbert, MD

## 2023-03-13 ENCOUNTER — Encounter: Payer: Medicare Other | Admitting: Family Medicine

## 2023-03-19 ENCOUNTER — Encounter: Payer: Medicare Other | Admitting: Family Medicine

## 2023-03-25 ENCOUNTER — Ambulatory Visit: Payer: Medicare Other | Attending: Cardiovascular Disease | Admitting: Cardiovascular Disease

## 2023-03-26 ENCOUNTER — Encounter: Payer: Self-pay | Admitting: Cardiovascular Disease

## 2023-04-09 ENCOUNTER — Encounter: Payer: Medicare Other | Admitting: Family Medicine

## 2023-04-16 ENCOUNTER — Other Ambulatory Visit: Payer: Self-pay | Admitting: Family Medicine

## 2023-04-17 NOTE — Telephone Encounter (Signed)
Patient is on atorvastatin 40 mg, will refuse this request.    Requested Prescriptions  Pending Prescriptions Disp Refills   atorvastatin (LIPITOR) 20 MG tablet [Pharmacy Med Name: Atorvastatin Calcium 20 MG Oral Tablet] 90 tablet 0    Sig: Take 1 tablet by mouth once daily     Cardiovascular:  Antilipid - Statins Failed - 04/16/2023  5:43 PM      Failed - Lipid Panel in normal range within the last 12 months    Cholesterol, Total  Date Value Ref Range Status  09/18/2022 199 100 - 199 mg/dL Final   Cholesterol Piccolo, Waived  Date Value Ref Range Status  10/16/2016 CANCELED      Comment:    Test not performed  Result canceled by the ancillary    LDL Chol Calc (NIH)  Date Value Ref Range Status  09/18/2022 124 (H) 0 - 99 mg/dL Final   HDL  Date Value Ref Range Status  09/18/2022 35 (L) >39 mg/dL Final   Triglycerides  Date Value Ref Range Status  09/18/2022 227 (H) 0 - 149 mg/dL Final   Triglycerides Piccolo,Waived  Date Value Ref Range Status  10/16/2016 CANCELED      Comment:    Test not performed  Result canceled by the ancillary          Passed - Patient is not pregnant      Passed - Valid encounter within last 12 months    Recent Outpatient Visits           7 months ago Encounter for annual wellness visit (AWV) in Medicare patient   [place] [facility] [place], Megan P, DO   11 months ago Hypertensive kidney disease with stage 3 chronic kidney disease, unspecified whether stage 3a or 3b CKD (HCC)   [place] [facility] [place], Megan P, DO   11 months ago Chronic right-sided low back pain without sciatica   [place] [facility] [place], Megan P, DO   1 year ago Right lower quadrant abdominal pain   [place] [facility] [place], Oswaldo Conroy, PA-C   1 year ago Routine general medical examination at a health care facility   [facility] [place], Oralia Rud, DO       Future  Appointments             In 4 days Dorcas Carrow, DO  [facility]   In 1 month Richardo Hanks, Laurette Schimke, MD [facility] [place]

## 2023-04-21 ENCOUNTER — Encounter: Payer: Medicare Other | Admitting: Family Medicine

## 2023-04-30 ENCOUNTER — Other Ambulatory Visit: Payer: Self-pay | Admitting: Family Medicine

## 2023-04-30 DIAGNOSIS — F3170 Bipolar disorder, currently in remission, most recent episode unspecified: Secondary | ICD-10-CM

## 2023-04-30 NOTE — Telephone Encounter (Signed)
Requested Prescriptions  Pending Prescriptions Disp Refills   divalproex (DEPAKOTE ER) 250 MG 24 hr tablet [Pharmacy Med Name: Divalproex Sodium ER 250 MG Oral Tablet Extended Release 24 Hour] 180 tablet 0    Sig: Take 1 tablet by mouth twice daily     Neurology:  Anticonvulsants - Valproates Failed - 04/30/2023  6:51 AM      Failed - HGB in normal range and within 360 days    Hemoglobin  Date Value Ref Range Status  01/27/2023 11.0 (L) 13.0 - 17.0 g/dL Final  16/09/9603 54.0 (L) 13.0 - 17.7 g/dL Final         Failed - PLT in normal range and within 360 days    Platelets  Date Value Ref Range Status  01/27/2023 107 (L) 150 - 400 K/uL Final  09/18/2022 160 150 - 450 x10E3/uL Final         Failed - WBC in normal range and within 360 days    WBC  Date Value Ref Range Status  01/27/2023 2.8 (L) 4.0 - 10.5 K/uL Final         Failed - HCT in normal range and within 360 days    HCT  Date Value Ref Range Status  01/27/2023 34.1 (L) 39.0 - 52.0 % Final   Hematocrit  Date Value Ref Range Status  09/18/2022 39.0 37.5 - 51.0 % Final         Failed - Valproic Acid (serum) in normal range and within 360 days    Valproic Acid Lvl  Date Value Ref Range Status  03/28/2022 60 50 - 100 ug/mL Final    Comment:                                    Detection Limit = 4                            <4 indicates None Detected Toxicity may occur at levels of 100-500. Measurements of free unbound valproic acid may improve the assess- ment of clinical response.          Passed - AST in normal range and within 360 days    AST  Date Value Ref Range Status  09/18/2022 11 0 - 40 IU/L Final   AST (SGOT) Piccolo, Waived  Date Value Ref Range Status  10/16/2016 23 11 - 38 U/L Final         Passed - ALT in normal range and within 360 days    ALT  Date Value Ref Range Status  09/18/2022 11 0 - 44 IU/L Final   ALT (SGPT) Piccolo, Waived  Date Value Ref Range Status  10/16/2016 26 10 - 47 U/L  Final         Passed - Completed PHQ-2 or PHQ-9 in the last 360 days      Passed - Patient is not pregnant      Passed - Valid encounter within last 12 months    Recent Outpatient Visits           7 months ago Encounter for annual wellness visit (AWV) in Medicare patient   [place] [facility] [place], Megan P, DO   11 months ago Hypertensive kidney disease with stage 3 chronic kidney disease, unspecified whether stage 3a or 3b CKD (HCC)   [place] [facility] [place],  Megan P, DO   1 year ago Chronic right-sided low back pain without sciatica   [place] [facility] [place], Megan P, DO   1 year ago Right lower quadrant abdominal pain   [place] [facility] [place], Oswaldo Conroy, PA-C   1 year ago Routine general medical examination at a health care facility   [facility] [place], Oralia Rud, DO       Future Appointments             In 1 week Dorcas Carrow, DO [place] [facility]   In 1 month Richardo Hanks, Laurette Schimke, MD [facility] [place]             DULoxetine (CYMBALTA) 20 MG capsule [Pharmacy Med Name: DULoxetine HCl 20 MG Oral Capsule Delayed Release Particles] 180 capsule 0    Sig: Take 2 capsules by mouth once daily     Psychiatry: Antidepressants - SNRI - duloxetine Failed - 04/30/2023  6:51 AM      Failed - Cr in normal range and within 360 days    Creatinine, Ser  Date Value Ref Range Status  09/18/2022 1.28 (H) 0.76 - 1.27 mg/dL Final         Failed - Last BP in normal range    BP Readings from Last 1 Encounters:  03/12/23 (!) 140/72         Failed - Valid encounter within last 6 months    Recent Outpatient Visits           7 months ago Encounter for annual wellness visit (AWV) in Medicare patient   [place] [facility] [place], Megan P, DO   11 months ago Hypertensive kidney disease with stage 3 chronic kidney disease,  unspecified whether stage 3a or 3b CKD (HCC)   [place] [facility] [place], Megan P, DO   1 year ago Chronic right-sided low back pain without sciatica   [place] [facility] [place], Megan P, DO   1 year ago Right lower quadrant abdominal pain   [place] [facility] [place], Oswaldo Conroy, PA-C   1 year ago Routine general medical examination at a health care facility   [place] [place], [place], DO       Future Appointments             In 1 week Dorcas Carrow, DO [place] [facility], [facility]   In 1 month Sondra Come, MD [facility] [place]            Passed - eGFR is 30 or above and within 360 days    GFR calc Af Denyse Dago  Date Value Ref Range Status  10/15/2020 57 (L) >59 mL/min/1.73 Final    Comment:    **In accordance with recommendations from the NKF-ASN Task force,**   [facility] is in the process of updating its eGFR calculation to the   2021 CKD-EPI creatinine equation that estimates kidney function   without a race variable.    GFR, Estimated  Date Value Ref Range Status  08/08/2021 54 (L) >60 mL/min Final    Comment:    (NOTE) Calculated using the CKD-EPI Creatinine Equation (2021)    eGFR  Date Value Ref Range Status  09/18/2022 58 (L) >59 mL/min/1.73 Final         Passed - Completed PHQ-2 or PHQ-9 in the last 360  days

## 2023-05-11 ENCOUNTER — Encounter: Payer: Self-pay | Admitting: Family Medicine

## 2023-05-11 ENCOUNTER — Ambulatory Visit (INDEPENDENT_AMBULATORY_CARE_PROVIDER_SITE_OTHER): Payer: Medicare Other | Admitting: Family Medicine

## 2023-05-11 VITALS — BP 139/67 | HR 66 | Temp 97.5°F | Ht 63.0 in | Wt 169.6 lb

## 2023-05-11 DIAGNOSIS — Z0000 Encounter for general adult medical examination without abnormal findings: Secondary | ICD-10-CM | POA: Diagnosis not present

## 2023-05-11 DIAGNOSIS — I129 Hypertensive chronic kidney disease with stage 1 through stage 4 chronic kidney disease, or unspecified chronic kidney disease: Secondary | ICD-10-CM | POA: Diagnosis not present

## 2023-05-11 DIAGNOSIS — N183 Chronic kidney disease, stage 3 unspecified: Secondary | ICD-10-CM | POA: Diagnosis not present

## 2023-05-11 DIAGNOSIS — F3170 Bipolar disorder, currently in remission, most recent episode unspecified: Secondary | ICD-10-CM

## 2023-05-11 DIAGNOSIS — Z8546 Personal history of malignant neoplasm of prostate: Secondary | ICD-10-CM | POA: Insufficient documentation

## 2023-05-11 DIAGNOSIS — E785 Hyperlipidemia, unspecified: Secondary | ICD-10-CM | POA: Diagnosis not present

## 2023-05-11 DIAGNOSIS — N400 Enlarged prostate without lower urinary tract symptoms: Secondary | ICD-10-CM | POA: Diagnosis not present

## 2023-05-11 DIAGNOSIS — C61 Malignant neoplasm of prostate: Secondary | ICD-10-CM

## 2023-05-11 DIAGNOSIS — I700 Atherosclerosis of aorta: Secondary | ICD-10-CM

## 2023-05-11 DIAGNOSIS — F3132 Bipolar disorder, current episode depressed, moderate: Secondary | ICD-10-CM

## 2023-05-11 LAB — URINALYSIS, ROUTINE W REFLEX MICROSCOPIC
Bilirubin, UA: NEGATIVE
Glucose, UA: NEGATIVE
Ketones, UA: NEGATIVE
Leukocytes,UA: NEGATIVE
Nitrite, UA: NEGATIVE
Protein,UA: NEGATIVE
RBC, UA: NEGATIVE
Specific Gravity, UA: 1.015 (ref 1.005–1.030)
Urobilinogen, Ur: 1 mg/dL (ref 0.2–1.0)
pH, UA: 7 (ref 5.0–7.5)

## 2023-05-11 LAB — MICROALBUMIN, URINE WAIVED
Creatinine, Urine Waived: 100 mg/dL (ref 10–300)
Microalb, Ur Waived: 10 mg/L (ref 0–19)
Microalb/Creat Ratio: <30 mg/g (ref <30)

## 2023-05-11 MED ORDER — LOSARTAN POTASSIUM 50 MG PO TABS: 100.0000 mg | ORAL_TABLET | Freq: Every day | ORAL | 1 refills | Status: DC

## 2023-05-11 MED ORDER — BUPROPION HCL ER (XL) 300 MG PO TB24: 300.0000 mg | ORAL_TABLET | Freq: Every day | ORAL | 1 refills | Status: DC

## 2023-05-11 MED ORDER — OMEPRAZOLE 40 MG PO CPDR: 40.0000 mg | DELAYED_RELEASE_CAPSULE | Freq: Every day | ORAL | 1 refills | Status: DC

## 2023-05-11 MED ORDER — RISPERIDONE 1 MG PO TABS
2.5000 mg | ORAL_TABLET | Freq: Every day | ORAL | 1 refills | Status: DC
Start: 2023-05-11 — End: 2023-11-11

## 2023-05-11 MED ORDER — DIVALPROEX SODIUM ER 250 MG PO TB24: 250.0000 mg | ORAL_TABLET | Freq: Two times a day (BID) | ORAL | 1 refills | Status: DC

## 2023-05-11 MED ORDER — ATORVASTATIN CALCIUM 40 MG PO TABS: 40.0000 mg | ORAL_TABLET | Freq: Every day | ORAL | 3 refills | Status: DC

## 2023-05-11 MED ORDER — DULOXETINE HCL 20 MG PO CPEP: 40.0000 mg | ORAL_CAPSULE | Freq: Every day | ORAL | 1 refills | Status: DC

## 2023-05-11 NOTE — Assessment & Plan Note (Signed)
Under good control on current regimen. Continue current regimen. Continue to monitor. Call with any concerns. Refills given. Labs drawn today.   

## 2023-05-11 NOTE — Assessment & Plan Note (Signed)
Continue to follow with urology. Call with any concerns. Continue to monitor.  

## 2023-05-11 NOTE — Progress Notes (Signed)
BP 139/67   Pulse 66   Temp (!) 97.5 F (36.4 C) (Oral)   Ht 5\' 3"  (1.6 m)   Wt 169 lb 9.6 oz (76.9 kg)   SpO2 97%   BMI 30.04 kg/m    Subjective:    Patient ID: Dean Larsen, male    DOB: 29-Mar-1946, 77 y.o.   MRN: [id number]  HPI: Dean Larsen is a 77 y.o. male presenting on 05/11/2023 for comprehensive medical examination. Current medical complaints include:  HYPERTENSION / HYPERLIPIDEMIA Satisfied with current treatment? yes Duration of hypertension: chronic BP monitoring frequency: not checking BP medication side effects: no Past BP meds: losartan Duration of hyperlipidemia: chronic Cholesterol medication side effects: no Cholesterol supplements: none Past cholesterol medications: atorvastatin Medication compliance: excellent compliance Aspirin: yes Recent stressors: no Recurrent headaches: no Visual changes: no Palpitations: no Dyspnea: no Chest pain: no Lower extremity edema: no Dizzy/lightheaded: no  BIPOLAR Mood status: stable Satisfied with current treatment?: yes Symptom severity: mild  Duration of current treatment : chronic Side effects: no Medication compliance: excellent compliance Psychotherapy/counseling: no  Previous psychiatric medications: risperidone, cymbalta, wellbutrin Depressed mood: no Anxious mood: no Anhedonia: no Significant weight loss or gain: no Insomnia: no  Fatigue: no Feelings of worthlessness or guilt: no Impaired concentration/indecisiveness: no Suicidal ideations: no Hopelessness: no Crying spells: no    05/11/2023    9:20 AM 09/18/2022    1:26 PM 05/12/2022   10:15 AM 04/28/2022   10:32 AM 04/07/2022   11:34 AM  Depression screen PHQ 2/9  Decreased Interest 0 0 0 0 0  Down, Depressed, Hopeless 0 0 0 0 0  PHQ - 2 Score 0 0 0 0 0  Altered sleeping 0 0 0 0 0  Tired, decreased energy 0 0 0 0 0  Change in appetite 0 0 0 0 0  Feeling bad or failure about yourself  0 0 0 0 0  Trouble concentrating 0 0 0 0 0   Moving slowly or fidgety/restless 0 0 0 0 0  Suicidal thoughts 0 0 0 0 0  PHQ-9 Score 0 0 0 0 0  Difficult doing work/chores Not difficult at all Not difficult at all Not difficult at all Not difficult at all Not difficult at all    He currently lives with: wife, grandson and daughter Interim Problems from his last visit: no  Depression Screen done today and results listed below:     05/11/2023    9:20 AM 09/18/2022    1:26 PM 05/12/2022   10:15 AM 04/28/2022   10:32 AM 04/07/2022   11:34 AM  Depression screen PHQ 2/9  Decreased Interest 0 0 0 0 0  Down, Depressed, Hopeless 0 0 0 0 0  PHQ - 2 Score 0 0 0 0 0  Altered sleeping 0 0 0 0 0  Tired, decreased energy 0 0 0 0 0  Change in appetite 0 0 0 0 0  Feeling bad or failure about yourself  0 0 0 0 0  Trouble concentrating 0 0 0 0 0  Moving slowly or fidgety/restless 0 0 0 0 0  Suicidal thoughts 0 0 0 0 0  PHQ-9 Score 0 0 0 0 0  Difficult doing work/chores Not difficult at all Not difficult at all Not difficult at all Not difficult at all Not difficult at all     Past Medical History:  Past Medical History:  Diagnosis Date   Aortic insufficiency    a. 08/2020 Echo:  EF 50-55%, bicuspid AoV, mild-mod AI; b. 07/2021 Echo: EF 55-60%, no rwma, basal-septal hypertrophy, GrI DD, nl RV fxn, mild MR/AS, bicuspid AoV, mild-mod AI. Asc Ao 39mm.   Arthritis    Bicuspid aortic valve    Bipolar disorder (HCC)    CVA (cerebral vascular accident) (HCC)    Age 35 - no deficits   Dilated ascending aorta (HCC)    a. 07/2021 Echo: As Ao 39mm.   Diverticulitis of colon    GERD (gastroesophageal reflux disease)    History of cardiovascular stress test    a. 08/2020 MV: EF 61%, no ischemia/scar. Mild coronary artery and AoV Ca2+ noted.   Hyperlipidemia    Iron deficiency anemia     Surgical History:  Past Surgical History:  Procedure Laterality Date   ESOPHAGOGASTRODUODENOSCOPY (EGD) WITH PROPOFOL N/A 05/23/2016   Procedure:  ESOPHAGOGASTRODUODENOSCOPY (EGD) WITH PROPOFOL with dialation;  Surgeon: Midge Minium, MD;  Location: [facility];  Service: Endoscopy;  Laterality: N/A;   ESOPHAGOGASTRODUODENOSCOPY (EGD) WITH PROPOFOL N/A 06/14/2019   Procedure: ESOPHAGOGASTRODUODENOSCOPY (EGD) WITH PROPOFOL;  Surgeon: Midge Minium, MD;  Location: [facility];  Service: Endoscopy;  Laterality: N/A;   HERNIA REPAIR     ORTHOPEDIC SURGERY     Surgery for broken leg and toe    Medications:  Current Outpatient Medications on File Prior to Visit  Medication Sig   aspirin 81 MG tablet Take 81 mg by mouth daily.   No current facility-administered medications on file prior to visit.    Allergies:  Allergies  Allergen Reactions   Lisinopril    Codeine Rash   Penicillin G Benzathine Rash   Percocet [Oxycodone-Acetaminophen] Rash    Social History:  Social History   Socioeconomic History   Marital status: Married    Spouse name: Not on file   Number of children: Not on file   Years of education: Not on file   Highest education level: Not on file  Occupational History   Occupation: retired   Tobacco Use   Smoking status: Former    Passive exposure: Past   Smokeless tobacco: Current    Types: Snuff  Vaping Use   Vaping Use: Never used  Substance and Sexual Activity   Alcohol use: No    Alcohol/week: 0.0 standard drinks of alcohol   Drug use: No   Sexual activity: Yes    Birth control/protection: None  Other Topics Concern   Not on file  Social History Narrative   Not on file   Social Determinants of Health   Financial Resource Strain: Low Risk  (04/15/2021)   Overall Financial Resource Strain (CARDIA)    Difficulty of Paying Living Expenses: Not hard at all  Food Insecurity: No Food Insecurity (04/15/2021)   Hunger Vital Sign    Worried About Running Out of Food in the Last Year: Never true    Ran Out of Food in the Last Year: Never true  Transportation Needs: No Transportation Needs  (04/15/2021)   PRAPARE - Administrator, Civil Service (Medical): No    Lack of Transportation (Non-Medical): No  Physical Activity: Inactive (04/15/2021)   Exercise Vital Sign    Days of Exercise per Week: 0 days    Minutes of Exercise per Session: 0 min  Stress: No Stress Concern Present (04/15/2021)   [employer] of Occupational Health - Occupational Stress Questionnaire    Feeling of Stress : Not at all  Social Connections: Moderately Isolated (04/11/2020)   Social Connection  and Isolation Panel [NHANES]    Frequency of Communication with Friends and Family: More than three times a week    Frequency of Social Gatherings with Friends and Family: More than three times a week    Attends Religious Services: Never    Database administrator or Organizations: No    Attends Banker Meetings: Never    Marital Status: Married  Catering manager Violence: Not At Risk (04/07/2018)   Humiliation, Afraid, Rape, and Kick questionnaire    Fear of Current or Ex-Partner: No    Emotionally Abused: No    Physically Abused: No    Sexually Abused: No   Social History   Tobacco Use  Smoking Status Former   Passive exposure: Past  Smokeless Tobacco Current   Types: Snuff   Social History   Substance and Sexual Activity  Alcohol Use No   Alcohol/week: 0.0 standard drinks of alcohol    Family History:  Family History  Problem Relation Age of Onset   Stroke Father    Diabetes Brother     Past medical history, surgical history, medications, allergies, family history and social history reviewed with patient today and changes made to appropriate areas of the chart.   Review of Systems  Constitutional: Negative.   HENT: Negative.    Eyes: Negative.   Respiratory: Negative.    Cardiovascular: Negative.   Gastrointestinal: Negative.   Genitourinary: Negative.   Musculoskeletal: Negative.   Skin: Negative.   Neurological: Negative.   Endo/Heme/Allergies:  Negative.   Psychiatric/Behavioral: Negative.     All other ROS negative except what is listed above and in the HPI.      Objective:    BP 139/67   Pulse 66   Temp (!) 97.5 F (36.4 C) (Oral)   Ht 5\' 3"  (1.6 m)   Wt 169 lb 9.6 oz (76.9 kg)   SpO2 97%   BMI 30.04 kg/m   Wt Readings from Last 3 Encounters:  05/11/23 169 lb 9.6 oz (76.9 kg)  03/12/23 172 lb (78 kg)  11/13/22 177 lb (80.3 kg)    Physical Exam Vitals and nursing note reviewed.  Constitutional:      General: He is not in acute distress.    Appearance: Normal appearance. He is obese. He is not ill-appearing, toxic-appearing or diaphoretic.  HENT:     Head: Normocephalic and atraumatic.     Right Ear: Tympanic membrane, ear canal and external ear normal. There is no impacted cerumen.     Left Ear: Tympanic membrane, ear canal and external ear normal. There is no impacted cerumen.     Nose: Nose normal. No congestion or rhinorrhea.     Mouth/Throat:     Mouth: Mucous membranes are moist.     Pharynx: Oropharynx is clear. No oropharyngeal exudate or posterior oropharyngeal erythema.  Eyes:     General: No scleral icterus.       Right eye: No discharge.        Left eye: No discharge.     Extraocular Movements: Extraocular movements intact.     Conjunctiva/sclera: Conjunctivae normal.     Pupils: Pupils are equal, round, and reactive to light.  Neck:     Vascular: No carotid bruit.  Cardiovascular:     Rate and Rhythm: Normal rate and regular rhythm.     Pulses: Normal pulses.     Heart sounds: No murmur heard.    No friction rub. No gallop.  Pulmonary:  Effort: Pulmonary effort is normal. No respiratory distress.     Breath sounds: Normal breath sounds. No stridor. No wheezing, rhonchi or rales.  Chest:     Chest wall: No tenderness.  Abdominal:     General: Abdomen is flat. Bowel sounds are normal. There is no distension.     Palpations: Abdomen is soft. There is no mass.     Tenderness: There is no  abdominal tenderness. There is no right CVA tenderness, left CVA tenderness, guarding or rebound.     Hernia: No hernia is present.  Genitourinary:    Comments: Genital exam deferred with shared decision making Musculoskeletal:        General: No swelling, tenderness, deformity or signs of injury.     Cervical back: Normal range of motion and neck supple. No rigidity. No muscular tenderness.     Right lower leg: No edema.     Left lower leg: No edema.  Lymphadenopathy:     Cervical: No cervical adenopathy.  Skin:    General: Skin is warm and dry.     Capillary Refill: Capillary refill takes less than 2 seconds.     Coloration: Skin is not jaundiced or pale.     Findings: No bruising, erythema, lesion or rash.  Neurological:     General: No focal deficit present.     Mental Status: He is alert and oriented to person, place, and time.     Cranial Nerves: No cranial nerve deficit.     Sensory: No sensory deficit.     Motor: No weakness.     Coordination: Coordination normal.     Gait: Gait normal.     Deep Tendon Reflexes: Reflexes normal.  Psychiatric:        Mood and Affect: Mood normal. Affect is blunt.        Behavior: Behavior normal.        Thought Content: Thought content normal.        Judgment: Judgment normal.     Results for orders placed or performed in visit on 02/11/23  Rad Onc Aria Session Summary  Result Value Ref Range   Course ID [email or web address]    Course Intent Curative    Course Start Date 11/26/2022 10:25 AM    Session Number 40    Course First Treatment Date 12/10/2022  8:27 AM    Course Last Treatment Date 02/11/2023  9:39 AM    Course Elapsed Days 63    Reference Point ID [email or web address]_Pelv    Reference Point Dosage Given to Date 80 Gy   Reference Point Session Dosage Given 2 Gy   Plan ID Prostate_Pelv    Plan Fractions Treated to Date 40    Plan Total Fractions Prescribed 40    Plan Prescribed Dose Per Fraction 2 Gy   Plan Total Prescribed Dose 80.000000  Gy   Plan Primary Reference Point [email or web address]       Assessment & Plan:   Problem List Items Addressed This Visit       Cardiovascular and Mediastinum   Aortic atherosclerosis (HCC)    Will keep BP and cholesterol under good control. Continue to monitor. Call with any concerns.       Relevant Medications   atorvastatin (LIPITOR) 40 MG tablet   losartan (COZAAR) 50 MG tablet   Other Relevant Orders   Comprehensive metabolic panel   CBC with Differential/Platelet   Lipid Panel w/o Chol/HDL Ratio     Genitourinary   Hypertensive kidney disease  with CKD stage III (HCC)    Under good control on current regimen. Continue current regimen. Continue to monitor. Call with any concerns. Refills given. Labs drawn today.        Relevant Orders   Comprehensive metabolic panel   CBC with Differential/Platelet   TSH   Urinalysis, Routine w reflex microscopic   Microalbumin, Urine Waived   BPH (benign prostatic hyperplasia)    Continue to follow with urology. Call with any concerns. Continue to monitor.      Relevant Orders   Comprehensive metabolic panel   CBC with Differential/Platelet   Prostate cancer [facility])    Continue to follow with oncology and urology. Call with any concerns. Continue to monitor.         Other   Bipolar disorder (HCC)    Under good control on current regimen. Continue current regimen. Continue to monitor. Call with any concerns. Refills given. Labs drawn today.        Relevant Medications   buPROPion (WELLBUTRIN XL) 300 MG 24 hr tablet   divalproex (DEPAKOTE ER) 250 MG 24 hr tablet   Other Relevant Orders   Comprehensive metabolic panel   CBC with Differential/Platelet   Valproic Acid level   Hyperlipidemia    Under good control on current regimen. Continue current regimen. Continue to monitor. Call with any concerns. Refills given. Labs drawn today.       Relevant Medications   atorvastatin (LIPITOR) 40 MG tablet   losartan (COZAAR) 50 MG  tablet   Other Relevant Orders   Comprehensive metabolic panel   CBC with Differential/Platelet   Lipid Panel w/o Chol/HDL Ratio   Other Visit Diagnoses     Routine general medical examination at a health care facility    -  Primary   Vaccines up to date. Screening labs checked today. Colonoscopy N/A. Continue diet and exercise. Continue to monitor. Call with any concerns.        LABORATORY TESTING:  Health maintenance labs ordered today as discussed above.   The natural history of prostate cancer and ongoing controversy regarding screening and potential treatment outcomes of prostate cancer has been discussed with the patient. The meaning of a false positive PSA and a false negative PSA has been discussed. He indicates understanding of the limitations of this screening test and wishes to proceed with screening PSA testing.   IMMUNIZATIONS:   - Tdap: Tetanus vaccination status reviewed: last tetanus booster within 10 years. - Influenza: Up to date - Pneumovax: Up to date - Prevnar: Up to date - COVID: Up to date - HPV: Not applicable - Shingrix vaccine: Refused  SCREENING: - Colonoscopy: Not applicable  Discussed with patient purpose of the colonoscopy is to detect colon cancer at curable precancerous or early stages   PATIENT COUNSELING:    Sexuality: Discussed sexually transmitted diseases, partner selection, use of condoms, avoidance of unintended pregnancy  and contraceptive alternatives.   Advised to avoid cigarette smoking.  I discussed with the patient that most people either abstain from alcohol or drink within safe limits (<=14/week and <=4 drinks/occasion for males, <=7/weeks and <= 3 drinks/occasion for females) and that the risk for alcohol disorders and other health effects rises proportionally with the number of drinks per week and how often a drinker exceeds daily limits.  Discussed cessation/primary prevention of drug use and availability of treatment for  abuse.   Diet: Encouraged to adjust caloric intake to maintain  or achieve ideal body weight, to reduce intake  of dietary saturated fat and total fat, to limit sodium intake by avoiding high sodium foods and not adding table salt, and to maintain adequate dietary potassium and calcium preferably from fresh fruits, vegetables, and low-fat dairy products.    stressed the importance of regular exercise  Injury prevention: Discussed safety belts, safety helmets, smoke detector, smoking near bedding or upholstery.   Dental health: Discussed importance of regular tooth brushing, flossing, and dental visits.   Follow up plan: NEXT PREVENTATIVE PHYSICAL DUE IN 1 YEAR. Return in about 6 months (around 11/11/2023).

## 2023-05-11 NOTE — Assessment & Plan Note (Signed)
Will keep BP and cholesterol under good control. Continue to monitor. Call with any concerns.  

## 2023-05-11 NOTE — Assessment & Plan Note (Signed)
Continue to follow with oncology and urology. Call with any concerns. Continue to monitor.

## 2023-05-12 LAB — COMPREHENSIVE METABOLIC PANEL
ALT: 20 IU/L (ref 0–44)
AST: 19 IU/L (ref 0–40)
Albumin/Globulin Ratio: 1.5 (ref 1.2–2.2)
Albumin: 4.1 g/dL (ref 3.8–4.8)
Alkaline Phosphatase: 63 IU/L (ref 44–121)
BUN/Creatinine Ratio: 13 (ref 10–24)
BUN: 16 mg/dL (ref 8–27)
Bilirubin Total: 0.7 mg/dL (ref 0.0–1.2)
CO2: 26 mmol/L (ref 20–29)
Calcium: 9.2 mg/dL (ref 8.6–10.2)
Chloride: 100 mmol/L (ref 96–106)
Creatinine, Ser: 1.21 mg/dL (ref 0.76–1.27)
Globulin, Total: 2.8 g/dL (ref 1.5–4.5)
Glucose: 95 mg/dL (ref 70–99)
Potassium: 4.1 mmol/L (ref 3.5–5.2)
Sodium: 141 mmol/L (ref 134–144)
Total Protein: 6.9 g/dL (ref 6.0–8.5)
eGFR: 61 mL/min/{1.73_m2} (ref >59)

## 2023-05-12 LAB — CBC WITH DIFFERENTIAL/PLATELET
Basophils Absolute: 0 10*3/uL (ref 0.0–0.2)
Basos: 1 %
EOS (ABSOLUTE): 0.1 10*3/uL (ref 0.0–0.4)
Eos: 2 %
Hematocrit: 39.7 % (ref 37.5–51.0)
Hemoglobin: 12.7 g/dL — ABNORMAL LOW (ref 13.0–17.7)
Immature Grans (Abs): 0 10*3/uL (ref 0.0–0.1)
Immature Granulocytes: 0 %
Lymphocytes Absolute: 0.8 10*3/uL (ref 0.7–3.1)
Lymphs: 17 %
MCH: 30.9 pg (ref 26.6–33.0)
MCHC: 32 g/dL (ref 31.5–35.7)
MCV: 97 fL (ref 79–97)
Monocytes Absolute: 0.6 10*3/uL (ref 0.1–0.9)
Monocytes: 13 %
Neutrophils Absolute: 3 10*3/uL (ref 1.4–7.0)
Neutrophils: 67 %
Platelets: 154 10*3/uL (ref 150–450)
RBC: 4.11 x10E6/uL — ABNORMAL LOW (ref 4.14–5.80)
RDW: 13.1 % (ref 11.6–15.4)
WBC: 4.5 10*3/uL (ref 3.4–10.8)

## 2023-05-12 LAB — LIPID PANEL W/O CHOL/HDL RATIO
Cholesterol, Total: 225 mg/dL — ABNORMAL HIGH (ref 100–199)
HDL: 36 mg/dL — ABNORMAL LOW (ref >39)
LDL Chol Calc (NIH): 142 mg/dL — ABNORMAL HIGH (ref 0–99)
Triglycerides: 258 mg/dL — ABNORMAL HIGH (ref 0–149)
VLDL Cholesterol Cal: 47 mg/dL — ABNORMAL HIGH (ref 5–40)

## 2023-05-12 LAB — VALPROIC ACID LEVEL: Valproic Acid Lvl: 31 ug/mL — ABNORMAL LOW (ref 50–100)

## 2023-05-12 LAB — TSH: TSH: 3.15 u[IU]/mL (ref 0.450–4.500)

## 2023-05-18 ENCOUNTER — Other Ambulatory Visit: Payer: Self-pay | Admitting: Family Medicine

## 2023-05-19 NOTE — Telephone Encounter (Signed)
Requested Prescriptions  Refused Prescriptions Disp Refills   atorvastatin (LIPITOR) 20 MG tablet [Pharmacy Med Name: Atorvastatin Calcium 20 MG Oral Tablet] 90 tablet 0    Sig: Take 1 tablet by mouth once daily     Cardiovascular:  Antilipid - Statins Failed - 05/18/2023  6:51 AM      Failed - Lipid Panel in normal range within the last 12 months    Cholesterol, Total  Date Value Ref Range Status  05/11/2023 225 (H) 100 - 199 mg/dL Final   Cholesterol Piccolo, Waived  Date Value Ref Range Status  10/16/2016 CANCELED      Comment:    Test not performed  Result canceled by the ancillary    LDL Chol Calc (NIH)  Date Value Ref Range Status  05/11/2023 142 (H) 0 - 99 mg/dL Final   HDL  Date Value Ref Range Status  05/11/2023 36 (L) >39 mg/dL Final   Triglycerides  Date Value Ref Range Status  05/11/2023 258 (H) 0 - 149 mg/dL Final   Triglycerides Piccolo,Waived  Date Value Ref Range Status  10/16/2016 CANCELED      Comment:    Test not performed  Result canceled by the ancillary          Passed - Patient is not pregnant      Passed - Valid encounter within last 12 months    Recent Outpatient Visits           1 week ago Routine general medical examination at a health care facility   [facility] [place], Megan P, DO   8 months ago Encounter for annual wellness visit (AWV) in Medicare patient   [facility] [place], [place], DO   1 year ago Hypertensive kidney disease with stage 3 chronic kidney disease, unspecified whether stage 3a or 3b CKD (HCC)   [facility] [place], Megan P, DO   1 year ago Chronic right-sided low back pain without sciatica   [facility] [place], Megan P, DO   1 year ago Right lower quadrant abdominal pain   [place] [facility] [place], Oswaldo Conroy, PA-C       Future Appointments             In 2 weeks Richardo Hanks, Laurette Schimke, MD [facility]    In 5 months Laural Benes, Oralia Rud, DO [place] [facility]

## 2023-05-25 ENCOUNTER — Other Ambulatory Visit: Payer: Self-pay | Admitting: Family Medicine

## 2023-05-26 NOTE — Telephone Encounter (Signed)
Unable to refill per protocol, Rx expired. Discontinued 05/11/23.  Requested Prescriptions  Pending Prescriptions Disp Refills   risperiDONE (RISPERDAL) 0.5 MG tablet [Pharmacy Med Name: risperiDONE 0.5 MG Oral Tablet] 90 tablet 0    Sig: TAKE 1 TABLET BY MOUTH AT BEDTIME     Not Delegated - Psychiatry:  Antipsychotics - Second Generation (Atypical) - risperidone Failed - 05/25/2023  6:51 AM      Failed - This refill cannot be delegated      Failed - Lipid Panel in normal range within the last 12 months    Cholesterol, Total  Date Value Ref Range Status  05/11/2023 225 (H) 100 - 199 mg/dL Final   Cholesterol Piccolo, Waived  Date Value Ref Range Status  10/16/2016 CANCELED      Comment:    Test not performed  Result canceled by the ancillary    LDL Chol Calc (NIH)  Date Value Ref Range Status  05/11/2023 142 (H) 0 - 99 mg/dL Final   HDL  Date Value Ref Range Status  05/11/2023 36 (L) >39 mg/dL Final   Triglycerides  Date Value Ref Range Status  05/11/2023 258 (H) 0 - 149 mg/dL Final   Triglycerides Piccolo,Waived  Date Value Ref Range Status  10/16/2016 CANCELED      Comment:    Test not performed  Result canceled by the ancillary          Failed - CBC within normal limits and completed in the last 12 months    WBC  Date Value Ref Range Status  05/11/2023 4.5 3.4 - 10.8 x10E3/uL Final  01/27/2023 2.8 (L) 4.0 - 10.5 K/uL Final   RBC  Date Value Ref Range Status  05/11/2023 4.11 (L) 4.14 - 5.80 x10E6/uL Final  01/27/2023 3.59 (L) 4.22 - 5.81 MIL/uL Final   Hemoglobin  Date Value Ref Range Status  05/11/2023 12.7 (L) 13.0 - 17.7 g/dL Final   Hematocrit  Date Value Ref Range Status  05/11/2023 39.7 37.5 - 51.0 % Final   MCHC  Date Value Ref Range Status  05/11/2023 32.0 31.5 - 35.7 g/dL Final  16/09/9603 54.0 30.0 - 36.0 g/dL Final   [facility]  Date Value Ref Range Status  05/11/2023 30.9 26.6 - 33.0 pg Final  01/27/2023 30.6 26.0 - 34.0 pg Final   MCV   Date Value Ref Range Status  05/11/2023 97 79 - 97 fL Final   No results found for: "PLTCOUNTKUC", "LABPLAT", "POCPLA" RDW  Date Value Ref Range Status  05/11/2023 13.1 11.6 - 15.4 % Final         Passed - TSH in normal range and within 360 days    TSH  Date Value Ref Range Status  05/11/2023 3.150 0.450 - 4.500 uIU/mL Final         Passed - Completed PHQ-2 or PHQ-9 in the last 360 days      Passed - Last BP in normal range    BP Readings from Last 1 Encounters:  05/11/23 139/67         Passed - Last Heart Rate in normal range    Pulse Readings from Last 1 Encounters:  05/11/23 66         Passed - Valid encounter within last 6 months    Recent Outpatient Visits           2 weeks ago Routine general medical examination at a health care facility   [facility] [place], [place],  DO   8 months ago Encounter for annual wellness visit (AWV) in Medicare patient   [place] [facility] [place], [place], DO   1 year ago Hypertensive kidney disease with stage 3 chronic kidney disease, unspecified whether stage 3a or 3b CKD (HCC)   [place] [facility] [place], Megan P, DO   1 year ago Chronic right-sided low back pain without sciatica   [place] [facility] [place], Megan P, DO   1 year ago Right lower quadrant abdominal pain   [place] [facility] Mecum, Oswaldo Conroy, PA-C       Future Appointments             In 1 week Richardo Hanks Laurette Schimke, MD [facility] [place]   In 5 months Laural Benes, Oralia Rud, DO [facility], [facility]            Passed - CMP within normal limits and completed in the last 12 months    Albumin  Date Value Ref Range Status  05/11/2023 4.1 3.8 - 4.8 g/dL Final   Alkaline Phosphatase  Date Value Ref Range Status  05/11/2023 63 44 - 121 IU/L Final   ALT  Date Value Ref Range Status  05/11/2023 20 0 - 44 IU/L Final   ALT (SGPT) Piccolo,  Waived  Date Value Ref Range Status  10/16/2016 26 10 - 47 U/L Final   AST  Date Value Ref Range Status  05/11/2023 19 0 - 40 IU/L Final   AST (SGOT) Piccolo, Waived  Date Value Ref Range Status  10/16/2016 23 11 - 38 U/L Final   BUN  Date Value Ref Range Status  05/11/2023 16 8 - 27 mg/dL Final   Calcium  Date Value Ref Range Status  05/11/2023 9.2 8.6 - 10.2 mg/dL Final   CO2  Date Value Ref Range Status  05/11/2023 26 20 - 29 mmol/L Final   Creatinine, Ser  Date Value Ref Range Status  05/11/2023 1.21 0.76 - 1.27 mg/dL Final   Glucose  Date Value Ref Range Status  05/11/2023 95 70 - 99 mg/dL Final   Glucose, Bld  Date Value Ref Range Status  08/08/2021 117 (H) 70 - 99 mg/dL Final    Comment:    Glucose reference range applies only to samples taken after fasting for at least 8 hours.   Glucose-Capillary  Date Value Ref Range Status  08/08/2021 115 (H) 70 - 99 mg/dL Final    Comment:    Glucose reference range applies only to samples taken after fasting for at least 8 hours.   Potassium  Date Value Ref Range Status  05/11/2023 4.1 3.5 - 5.2 mmol/L Final   Sodium  Date Value Ref Range Status  05/11/2023 141 134 - 144 mmol/L Final   Bilirubin Total  Date Value Ref Range Status  05/11/2023 0.7 0.0 - 1.2 mg/dL Final   Protein, ur  Date Value Ref Range Status  08/07/2021 NEGATIVE NEGATIVE mg/dL Final   Protein,UA  Date Value Ref Range Status  05/11/2023 Negative Negative/Trace Final   Total Protein  Date Value Ref Range Status  05/11/2023 6.9 6.0 - 8.5 g/dL Final   GFR calc Af Amer  Date Value Ref Range Status  10/15/2020 57 (L) >59 mL/min/1.73 Final    Comment:    **In accordance with recommendations from the NKF-ASN Task force,**   [facility] is in the process of updating its eGFR calculation to the  2021 CKD-EPI creatinine equation that estimates kidney function   without a race variable.    eGFR  Date Value Ref Range Status  05/11/2023 61  >59 mL/min/1.73 Final   GFR, Estimated  Date Value Ref Range Status  08/08/2021 54 (L) >60 mL/min Final    Comment:    (NOTE) Calculated using the CKD-EPI Creatinine Equation (2021)

## 2023-06-02 ENCOUNTER — Other Ambulatory Visit: Payer: Medicare Other

## 2023-06-04 ENCOUNTER — Ambulatory Visit: Payer: Medicare Other | Admitting: Urology

## 2023-06-12 ENCOUNTER — Inpatient Hospital Stay: Payer: Medicare Other | Attending: Radiation Oncology

## 2023-06-12 ENCOUNTER — Other Ambulatory Visit: Payer: Self-pay | Admitting: *Deleted

## 2023-06-12 DIAGNOSIS — C61 Malignant neoplasm of prostate: Secondary | ICD-10-CM

## 2023-06-12 LAB — PSA: Prostatic Specific Antigen: 0.32 ng/mL (ref 0.00–4.00)

## 2023-06-18 ENCOUNTER — Ambulatory Visit
Admission: RE | Admit: 2023-06-18 | Discharge: 2023-06-18 | Disposition: A | Discharge status: Home / Self Care | Payer: Medicare Other | Source: Ambulatory Visit | Attending: Radiation Oncology | Admitting: Radiation Oncology

## 2023-06-18 ENCOUNTER — Encounter: Payer: Self-pay | Admitting: Radiation Oncology

## 2023-06-18 VITALS — BP 162/68 | HR 70 | Temp 96.2°F | Resp 15 | Ht 63.0 in | Wt 175.3 lb

## 2023-06-18 DIAGNOSIS — C770 Secondary and unspecified malignant neoplasm of lymph nodes of head, face and neck: Secondary | ICD-10-CM | POA: Diagnosis not present

## 2023-06-18 DIAGNOSIS — C61 Malignant neoplasm of prostate: Secondary | ICD-10-CM | POA: Diagnosis not present

## 2023-06-18 DIAGNOSIS — Z923 Personal history of irradiation: Secondary | ICD-10-CM | POA: Insufficient documentation

## 2023-06-18 DIAGNOSIS — Z191 Hormone sensitive malignancy status: Secondary | ICD-10-CM | POA: Diagnosis not present

## 2023-06-18 NOTE — Progress Notes (Signed)
Radiation Oncology Follow up Note  Name: Dean Larsen   Date:   06/18/2023 MRN:  [id number] DOB: 01-Apr-1946    This 77 y.o. male presents to the clinic today for 3-month follow-up status post image guided IMRT radiation therapy for stage III 3B Gleason 7 adenocarcinoma the prostate presenting with a PSA of 21.  REFERRING PROVIDER: Dorcas Carrow, DO  HPI: Patient is a 77 year old male now out for months having completed image guided IMRT radiation therapy to his prostate and pelvic nodes for Gleason 7 adenocarcinoma presenting with a PSA in the 21 range.  Seen today in routine follow-up he is doing well specifically denies any increased lower urinary tract symptoms diarrhea or fatigue.  His most recent PSA is 0.32.Marland Kitchen  COMPLICATIONS OF TREATMENT: none  FOLLOW UP COMPLIANCE: keeps appointments   PHYSICAL EXAM:  BP (!) 162/68   Pulse 70   Temp (!) 96.2 F (35.7 C)   Resp 15   Ht 5\' 3"  (1.6 m)   Wt 175 lb 4.8 oz (79.5 kg)   BMI 31.05 kg/m  Well-developed well-nourished patient in NAD. HEENT reveals PERLA, EOMI, discs not visualized.  Oral cavity is clear. No oral mucosal lesions are identified. Neck is clear without evidence of cervical or supraclavicular adenopathy. Lungs are clear to A&P. Cardiac examination is essentially unremarkable with regular rate and rhythm without murmur rub or thrill. Abdomen is benign with no organomegaly or masses noted. Motor sensory and DTR levels are equal and symmetric in the upper and lower extremities. Cranial nerves II through XII are grossly intact. Proprioception is intact. No peripheral adenopathy or edema is identified. No motor or sensory levels are noted. Crude visual fields are within normal range.  RADIOLOGY RESULTS: No current films for review  PLAN: Present time patient is under excellent biochemical control of his prostate cancer.  He will be seeing urology and I would recommend at least ADT therapy for 2 to 3 years.  I have asked to see  him back in 6 months for follow-up with a PSA.  Patient is to call with any concerns.  I would like to take this opportunity to thank you for allowing me to participate in the care of your patient.Carmina Miller, MD

## 2023-07-14 ENCOUNTER — Ambulatory Visit: Payer: Medicare Other | Admitting: Cardiovascular Disease

## 2023-08-18 ENCOUNTER — Other Ambulatory Visit: Payer: Self-pay | Admitting: Family Medicine

## 2023-08-19 NOTE — Telephone Encounter (Signed)
Last RF: 05/11/23 #90 1 RF  Requested Prescriptions  Refused Prescriptions Disp Refills   losartan (COZAAR) 50 MG tablet [Pharmacy Med Name: Losartan Potassium 50 MG Oral Tablet] 180 tablet 0    Sig: Take 2 tablets by mouth once daily     Cardiovascular:  Angiotensin Receptor Blockers Failed - 08/18/2023  9:06 AM      Failed - Last BP in normal range    BP Readings from Last 1 Encounters:  06/18/23 (!) 162/68         Passed - Cr in normal range and within 180 days    Creatinine, Ser  Date Value Ref Range Status  05/11/2023 1.21 0.76 - 1.27 mg/dL Final         Passed - K in normal range and within 180 days    Potassium  Date Value Ref Range Status  05/11/2023 4.1 3.5 - 5.2 mmol/L Final         Passed - Patient is not pregnant      Passed - Valid encounter within last 6 months    Recent Outpatient Visits           3 months ago Routine general medical examination at a health care facility   [facility], Megan P, DO   11 months ago Encounter for annual wellness visit (AWV) in Medicare patient   [place] [facility] [place], [place], DO   1 year ago Hypertensive kidney disease with stage 3 chronic kidney disease, unspecified whether stage 3a or 3b CKD (HCC)   [place] [facility] [place], Megan P, DO   1 year ago Chronic right-sided low back pain without sciatica   [place] [facility] [place], Megan P, DO   1 year ago Right lower quadrant abdominal pain   [place] [facility] Mecum, Oswaldo Conroy, PA-C       Future Appointments             In 1 month Arida, Chelsea Aus, MD [facility] [place]   In 2 months Laural Benes, Oralia Rud, DO [place] [facility], [facility]

## 2023-08-21 ENCOUNTER — Other Ambulatory Visit: Payer: Self-pay | Admitting: Family Medicine

## 2023-08-24 NOTE — Telephone Encounter (Signed)
Last refill 05/11/23 for 90 and 1 refill. Patient takes 2 tablets daily, which would give patient 45 day supply with 1 refill. Altogether it is a 90 day supply. Request is not too soon, will refill for 90 days.  Requested Prescriptions  Pending Prescriptions Disp Refills   losartan (COZAAR) 50 MG tablet [Pharmacy Med Name: Losartan Potassium 50 MG Oral Tablet] 180 tablet 0    Sig: Take 2 tablets by mouth once daily     Cardiovascular:  Angiotensin Receptor Blockers Failed - 08/21/2023  1:06 PM      Failed - Last BP in normal range    BP Readings from Last 1 Encounters:  06/18/23 (!) 162/68         Passed - Cr in normal range and within 180 days    Creatinine, Ser  Date Value Ref Range Status  05/11/2023 1.21 0.76 - 1.27 mg/dL Final         Passed - K in normal range and within 180 days    Potassium  Date Value Ref Range Status  05/11/2023 4.1 3.5 - 5.2 mmol/L Final         Passed - Patient is not pregnant      Passed - Valid encounter within last 6 months    Recent Outpatient Visits           3 months ago Routine general medical examination at a health care facility   [facility], Megan P, DO   11 months ago Encounter for annual wellness visit (AWV) in Medicare patient   [place] [facility] [place], [place], DO   1 year ago Hypertensive kidney disease with stage 3 chronic kidney disease, unspecified whether stage 3a or 3b CKD (HCC)   [place] [facility] [place], Megan P, DO   1 year ago Chronic right-sided low back pain without sciatica   [place] [facility] [place], Megan P, DO   1 year ago Right lower quadrant abdominal pain   [place] [facility] Mecum, Oswaldo Conroy, PA-C       Future Appointments             In 1 month Arida, Chelsea Aus, MD [facility] [place]   In 2 months Laural Benes, Oralia Rud, DO [place] [facility], [facility]

## 2023-10-08 ENCOUNTER — Ambulatory Visit: Payer: Medicare Other | Admitting: Cardiovascular Disease

## 2023-10-20 ENCOUNTER — Other Ambulatory Visit: Payer: Self-pay

## 2023-10-20 ENCOUNTER — Telehealth: Payer: Self-pay | Admitting: Cardiovascular Disease

## 2023-10-20 DIAGNOSIS — I351 Nonrheumatic aortic (valve) insufficiency: Secondary | ICD-10-CM

## 2023-10-20 NOTE — Telephone Encounter (Signed)
Pt daughter called in to sch echo. Pt will need a new order, since the other one expired in September.

## 2023-10-20 NOTE — Telephone Encounter (Signed)
New order placed. Scheduling team can we reach out and get scheduled.  Thank you!

## 2023-11-11 ENCOUNTER — Encounter: Payer: Self-pay | Admitting: Family Medicine

## 2023-11-11 ENCOUNTER — Ambulatory Visit (INDEPENDENT_AMBULATORY_CARE_PROVIDER_SITE_OTHER): Payer: Medicare Other | Admitting: Family Medicine

## 2023-11-11 VITALS — BP 134/73 | HR 71 | Ht 63.0 in | Wt 171.8 lb

## 2023-11-11 DIAGNOSIS — N183 Chronic kidney disease, stage 3 unspecified: Secondary | ICD-10-CM | POA: Diagnosis not present

## 2023-11-11 DIAGNOSIS — E785 Hyperlipidemia, unspecified: Secondary | ICD-10-CM | POA: Diagnosis not present

## 2023-11-11 DIAGNOSIS — I700 Atherosclerosis of aorta: Secondary | ICD-10-CM

## 2023-11-11 DIAGNOSIS — I129 Hypertensive chronic kidney disease with stage 1 through stage 4 chronic kidney disease, or unspecified chronic kidney disease: Secondary | ICD-10-CM | POA: Diagnosis not present

## 2023-11-11 DIAGNOSIS — Z23 Encounter for immunization: Secondary | ICD-10-CM | POA: Diagnosis not present

## 2023-11-11 DIAGNOSIS — F3170 Bipolar disorder, currently in remission, most recent episode unspecified: Secondary | ICD-10-CM | POA: Diagnosis not present

## 2023-11-11 DIAGNOSIS — C61 Malignant neoplasm of prostate: Secondary | ICD-10-CM

## 2023-11-11 DIAGNOSIS — Z0000 Encounter for general adult medical examination without abnormal findings: Secondary | ICD-10-CM

## 2023-11-11 MED ORDER — OMEPRAZOLE 40 MG PO CPDR: 40.0000 mg | DELAYED_RELEASE_CAPSULE | Freq: Every day | ORAL | 1 refills | Status: DC

## 2023-11-11 MED ORDER — BUPROPION HCL ER (XL) 300 MG PO TB24: 300.0000 mg | ORAL_TABLET | Freq: Every day | ORAL | 1 refills | Status: DC

## 2023-11-11 MED ORDER — ATORVASTATIN CALCIUM 40 MG PO TABS: 40.0000 mg | ORAL_TABLET | Freq: Every day | ORAL | 3 refills | Status: DC

## 2023-11-11 MED ORDER — DULOXETINE HCL 20 MG PO CPEP: 40.0000 mg | ORAL_CAPSULE | Freq: Every day | ORAL | 1 refills | Status: DC

## 2023-11-11 MED ORDER — DIVALPROEX SODIUM ER 250 MG PO TB24: 250.0000 mg | ORAL_TABLET | Freq: Two times a day (BID) | ORAL | 1 refills | Status: DC

## 2023-11-11 MED ORDER — RISPERIDONE 1 MG PO TABS: 2.5000 mg | ORAL_TABLET | Freq: Every day | ORAL | 1 refills | Status: DC

## 2023-11-11 MED ORDER — LOSARTAN POTASSIUM 50 MG PO TABS: 100.0000 mg | ORAL_TABLET | Freq: Every day | ORAL | 0 refills | Status: DC

## 2023-11-11 NOTE — Patient Instructions (Signed)
Preventative Services:  Health Risk Assessment and Personalized Prevention Plan: Done today Bone Mass Measurements: N/A CVD Screening: Done today Colon Cancer Screening: N/A Depression Screening: Done today Diabetes Screening: Done today Glaucoma Screening: See your eye doctor Hepatitis B vaccine: N/A Hepatitis C screening: up to date HIV Screening: up to date Flu Vaccine:up to date Lung cancer Screening: N/A Obesity Screening: done today Pneumonia Vaccines (2): up to date STI Screening: N/A PSA screening: done today

## 2023-11-11 NOTE — Progress Notes (Deleted)
BP 134/73   Pulse 71   Ht 5\' 3"  (1.6 m)   Wt 171 lb 12.8 oz (77.9 kg)   SpO2 98%   BMI 30.43 kg/m    Subjective:    Patient ID: Dean Larsen, male    DOB: 28-Nov-1946, 77 y.o.   MRN: [id number]  HPI: Dean Larsen is a 77 y.o. male  Chief Complaint  Patient presents with   Hyperlipidemia   HYPERTENSION / HYPERLIPIDEMIA Satisfied with current treatment? {Blank single:19197::"yes","no"} Duration of hypertension: {Blank single:19197::"chronic","months","years"} BP monitoring frequency: {Blank single:19197::"not checking","rarely","daily","weekly","monthly","a few times a day","a few times a week","a few times a month"} BP range:  BP medication side effects: {Blank single:19197::"yes","no"} Past BP meds: {Blank multiple:19196::"none","amlodipine","amlodipine/benazepril","atenolol","benazepril","benazepril/HCTZ","bisoprolol (bystolic)","carvedilol","chlorthalidone","clonidine","diltiazem","exforge HCT","HCTZ","irbesartan (avapro)","labetalol","lisinopril","lisinopril-HCTZ","losartan (cozaar)","methyldopa","nifedipine","olmesartan (benicar)","olmesartan-HCTZ","quinapril","ramipril","spironalactone","tekturna","valsartan","valsartan-HCTZ","verapamil"} Duration of hyperlipidemia: {Blank single:19197::"chronic","months","years"} Cholesterol medication side effects: {Blank single:19197::"yes","no"} Cholesterol supplements: {Blank multiple:19196::"none","fish oil","niacin","red yeast rice"} Past cholesterol medications: {Blank multiple:19196::"none","atorvastain (lipitor)","lovastatin (mevacor)","pravastatin (pravachol)","rosuvastatin (crestor)","simvastatin (zocor)","vytorin","fenofibrate (tricor)","gemfibrozil","ezetimide (zetia)","niaspan","lovaza"} Medication compliance: {Blank single:19197::"excellent compliance","good compliance","fair compliance","poor compliance"} Aspirin: {Blank single:19197::"yes","no"} Recent stressors: {Blank single:19197::"yes","no"} Recurrent headaches:  {Blank single:19197::"yes","no"} Visual changes: {Blank single:19197::"yes","no"} Palpitations: {Blank single:19197::"yes","no"} Dyspnea: {Blank single:19197::"yes","no"} Chest pain: {Blank single:19197::"yes","no"} Lower extremity edema: {Blank single:19197::"yes","no"} Dizzy/lightheaded: {Blank single:19197::"yes","no"}  DEPRESSION Mood status: {Blank single:19197::"controlled","uncontrolled","better","worse","exacerbated","stable"} Satisfied with current treatment?: {Blank single:19197::"yes","no"} Symptom severity: {Blank single:19197::"mild","moderate","severe"}  Duration of current treatment : {Blank single:19197::"chronic","months","years"} Side effects: {Blank single:19197::"yes","no"} Medication compliance: {Blank single:19197::"excellent compliance","good compliance","fair compliance","poor compliance"} Psychotherapy/counseling: {Blank single:19197::"yes","no"} {Blank single:19197::"current","in the past"} Previous psychiatric medications: {Blank multiple:19196::"abilify","amitryptiline","buspar","celexa","cymbalta","depakote","effexor","lamictal","lexapro","lithium","nortryptiline","paxil","prozac","pristiq (desvenlafaxine","seroquel","wellbutrin","zoloft","zyprexa"} Depressed mood: {Blank single:19197::"yes","no"} Anxious mood: {Blank single:19197::"yes","no"} Anhedonia: {Blank single:19197::"yes","no"} Significant weight loss or gain: {Blank single:19197::"yes","no"} Insomnia: {Blank single:19197::"yes","no"} {Blank single:19197::"hard to fall asleep","hard to stay asleep"} Fatigue: {Blank single:19197::"yes","no"} Feelings of worthlessness or guilt: {Blank single:19197::"yes","no"} Impaired concentration/indecisiveness: {Blank single:19197::"yes","no"} Suicidal ideations: {Blank single:19197::"yes","no"} Hopelessness: {Blank single:19197::"yes","no"} Crying spells: {Blank single:19197::"yes","no"}    11/11/2023    8:51 AM 05/11/2023    9:20 AM 09/18/2022    1:26 PM  05/12/2022   10:15 AM 04/28/2022   10:32 AM  Depression screen PHQ 2/9  Decreased Interest 0 0 0 0 0  Down, Depressed, Hopeless 0 0 0 0 0  PHQ - 2 Score 0 0 0 0 0  Altered sleeping  0 0 0 0  Tired, decreased energy  0 0 0 0  Change in appetite  0 0 0 0  Feeling bad or failure about yourself   0 0 0 0  Trouble concentrating  0 0 0 0  Moving slowly or fidgety/restless  0 0 0 0  Suicidal thoughts  0 0 0 0  PHQ-9 Score  0 0 0 0  Difficult doing work/chores  Not difficult at all Not difficult at all Not difficult at all Not difficult at all     Relevant past medical, surgical, family and social history reviewed and updated as indicated. Interim medical history since our last visit reviewed. Allergies and medications reviewed and updated.  Review of Systems  Per HPI unless specifically indicated above     Objective:    BP 134/73   Pulse 71   Ht 5\' 3"  (1.6 m)   Wt 171 lb 12.8 oz (77.9 kg)   SpO2 98%   BMI 30.43 kg/m   Wt Readings from Last 3 Encounters:  11/11/23 171 lb 12.8 oz (77.9 kg)  06/18/23 175 lb 4.8 oz (79.5 kg)  05/11/23 169 lb 9.6 oz (76.9 kg)    Physical Exam  Results for orders placed or performed in visit on 06/12/23  PSA  Result Value Ref Range  Prostatic Specific Antigen 0.32 0.00 - 4.00 ng/mL      Assessment & Plan:   Problem List Items Addressed This Visit   None    Follow up plan: No follow-ups on file.

## 2023-11-11 NOTE — Assessment & Plan Note (Signed)
Under good control on current regimen. Continue current regimen. Continue to monitor. Call with any concerns. Refills given. Labs drawn today.   

## 2023-11-11 NOTE — Assessment & Plan Note (Signed)
Continue to follow with urology. Checking PSA today.

## 2023-11-11 NOTE — Assessment & Plan Note (Signed)
Will keep blood pressure and cholesterol under good control. Continue to monitor.

## 2023-11-11 NOTE — Progress Notes (Signed)
BP 134/73   Pulse 71   Ht 5\' 3"  (1.6 m)   Wt 171 lb 12.8 oz (77.9 kg)   SpO2 98%   BMI 30.43 kg/m    Subjective:    Patient ID: Dean Larsen, male    DOB: Jun 07, 1946, 77 y.o.   MRN: [id number]  HPI: Dean Larsen is a 77 y.o. male presenting on 11/11/2023 for comprehensive medical examination. Current medical complaints include:  HYPERTENSION / HYPERLIPIDEMIA Satisfied with current treatment? yes Duration of hypertension: chronic BP monitoring frequency: not checking BP medication side effects: no Past BP meds: losartan Duration of hyperlipidemia: chronic Cholesterol medication side effects: no Cholesterol supplements: none Past cholesterol medications: atorvastatin Medication compliance: excellent compliance Aspirin: yes Recent stressors: no Recurrent headaches: no Visual changes: no Palpitations: no Dyspnea: no Chest pain: no Lower extremity edema: no Dizzy/lightheaded: no  BIPOLAR Mood status: controlled Satisfied with current treatment?: yes Symptom severity: mild  Duration of current treatment : chronic Side effects: no Medication compliance: excellent compliance Psychotherapy/counseling: no  Previous psychiatric medications: depakote, risperidone, wellbutrin, cymbalta Depressed mood: no Anxious mood: no Anhedonia: no Significant weight loss or gain: no Insomnia: no  Fatigue: no Feelings of worthlessness or guilt: no Impaired concentration/indecisiveness: no Suicidal ideations: no Hopelessness: no Crying spells: no    11/11/2023    8:51 AM 05/11/2023    9:20 AM 09/18/2022    1:26 PM 05/12/2022   10:15 AM 04/28/2022   10:32 AM  Depression screen PHQ 2/9  Decreased Interest 0 0 0 0 0  Down, Depressed, Hopeless 0 0 0 0 0  PHQ - 2 Score 0 0 0 0 0  Altered sleeping  0 0 0 0  Tired, decreased energy  0 0 0 0  Change in appetite  0 0 0 0  Feeling bad or failure about yourself   0 0 0 0  Trouble concentrating  0 0 0 0  Moving slowly or  fidgety/restless  0 0 0 0  Suicidal thoughts  0 0 0 0  PHQ-9 Score  0 0 0 0  Difficult doing work/chores  Not difficult at all Not difficult at all Not difficult at all Not difficult at all    He currently lives with: wife, grandson, daughter Interim Problems from his last visit: no  Functional Status Survey: Is the patient deaf or have difficulty hearing?: Yes Does the patient have difficulty seeing, even when wearing glasses/contacts?: No Does the patient have difficulty concentrating, remembering, or making decisions?: Yes Does the patient have difficulty walking or climbing stairs?: Yes Does the patient have difficulty dressing or bathing?: No Does the patient have difficulty doing errands alone such as visiting a doctor's office or shopping?: No  FALL RISK:    11/11/2023    8:51 AM 05/11/2023    9:20 AM 09/18/2022    1:26 PM 04/28/2022   10:32 AM 04/07/2022   11:33 AM  Fall Risk   Falls in the past year? 0 0 0 0 0  Number falls in past yr: 0 0 0 0 0  Injury with Fall? 0 0 0 1 0  Risk for fall due to : No Fall Risks No Fall Risks No Fall Risks No Fall Risks No Fall Risks  Follow up Falls evaluation completed Falls evaluation completed Falls evaluation completed Falls evaluation completed Falls evaluation completed    Depression Screen    11/11/2023    8:51 AM 05/11/2023    9:20 AM 09/18/2022  1:26 PM 05/12/2022   10:15 AM 04/28/2022   10:32 AM  Depression screen PHQ 2/9  Decreased Interest 0 0 0 0 0  Down, Depressed, Hopeless 0 0 0 0 0  PHQ - 2 Score 0 0 0 0 0  Altered sleeping  0 0 0 0  Tired, decreased energy  0 0 0 0  Change in appetite  0 0 0 0  Feeling bad or failure about yourself   0 0 0 0  Trouble concentrating  0 0 0 0  Moving slowly or fidgety/restless  0 0 0 0  Suicidal thoughts  0 0 0 0  PHQ-9 Score  0 0 0 0  Difficult doing work/chores  Not difficult at all Not difficult at all Not difficult at all Not difficult at all    Advanced Directives Does  patient have a HCPOA?    no If yes, name and contact information:  Does patient have a living will or MOST form?  no  Past Medical History:  Past Medical History:  Diagnosis Date   Aortic insufficiency    a. 08/2020 Echo: EF 50-55%, bicuspid AoV, mild-mod AI; b. 07/2021 Echo: EF 55-60%, no rwma, basal-septal hypertrophy, GrI DD, nl RV fxn, mild MR/AS, bicuspid AoV, mild-mod AI. Asc Ao 39mm.   Arthritis    Bicuspid aortic valve    Bipolar disorder (HCC)    CVA (cerebral vascular accident) (HCC)    Age 41 - no deficits   Dilated ascending aorta (HCC)    a. 07/2021 Echo: As Ao 39mm.   Diverticulitis of colon    GERD (gastroesophageal reflux disease)    History of cardiovascular stress test    a. 08/2020 MV: EF 61%, no ischemia/scar. Mild coronary artery and AoV Ca2+ noted.   Hyperlipidemia    Iron deficiency anemia     Surgical History:  Past Surgical History:  Procedure Laterality Date   ESOPHAGOGASTRODUODENOSCOPY (EGD) WITH PROPOFOL N/A 05/23/2016   Procedure: ESOPHAGOGASTRODUODENOSCOPY (EGD) WITH PROPOFOL with dialation;  Surgeon: Midge Minium, MD;  Location: [facility];  Service: Endoscopy;  Laterality: N/A;   ESOPHAGOGASTRODUODENOSCOPY (EGD) WITH PROPOFOL N/A 06/14/2019   Procedure: ESOPHAGOGASTRODUODENOSCOPY (EGD) WITH PROPOFOL;  Surgeon: Midge Minium, MD;  Location: [facility];  Service: Endoscopy;  Laterality: N/A;   HERNIA REPAIR     ORTHOPEDIC SURGERY     Surgery for broken leg and toe    Medications:  Current Outpatient Medications on File Prior to Visit  Medication Sig   aspirin 81 MG tablet Take 81 mg by mouth daily.   No current facility-administered medications on file prior to visit.    Allergies:  Allergies  Allergen Reactions   Lisinopril    Codeine Rash   Penicillin G Benzathine Rash   Percocet [Oxycodone-Acetaminophen] Rash    Social History:  Social History   Socioeconomic History   Marital status: Married    Spouse name: Not on file    Number of children: Not on file   Years of education: Not on file   Highest education level: Not on file  Occupational History   Occupation: retired   Tobacco Use   Smoking status: Former    Passive exposure: Past   Smokeless tobacco: Current    Types: Snuff  Vaping Use   Vaping status: Never Used  Substance and Sexual Activity   Alcohol use: No    Alcohol/week: 0.0 standard drinks of alcohol   Drug use: No   Sexual activity: Yes  Birth control/protection: None  Other Topics Concern   Not on file  Social History Narrative   Not on file   Social Determinants of Health   Financial Resource Strain: Patient Declined (11/10/2023)   Overall Financial Resource Strain (CARDIA)    Difficulty of Paying Living Expenses: Patient declined  Food Insecurity: No Food Insecurity (11/10/2023)   Hunger Vital Sign    Worried About Running Out of Food in the Last Year: Never true    Ran Out of Food in the Last Year: Never true  Transportation Needs: No Transportation Needs (11/10/2023)   PRAPARE - Administrator, Civil Service (Medical): No    Lack of Transportation (Non-Medical): No  Physical Activity: Unknown (11/10/2023)   Exercise Vital Sign    Days of Exercise per Week: 0 days    Minutes of Exercise per Session: Not on file  Stress: No Stress Concern Present (11/10/2023)   [employer] of Occupational Health - Occupational Stress Questionnaire    Feeling of Stress : Not at all  Social Connections: Unknown (11/10/2023)   Social Connection and Isolation Panel [NHANES]    Frequency of Communication with Friends and Family: Twice a week    Frequency of Social Gatherings with Friends and Family: Once a week    Attends Religious Services: Patient declined    Database administrator or Organizations: No    Attends Engineer, structural: Not on file    Marital Status: Married  Catering manager Violence: Not At Risk (04/07/2018)   Humiliation, Afraid, Rape, and  Kick questionnaire    Fear of Current or Ex-Partner: No    Emotionally Abused: No    Physically Abused: No    Sexually Abused: No   Social History   Tobacco Use  Smoking Status Former   Passive exposure: Past  Smokeless Tobacco Current   Types: Snuff   Social History   Substance and Sexual Activity  Alcohol Use No   Alcohol/week: 0.0 standard drinks of alcohol    Family History:  Family History  Problem Relation Age of Onset   Stroke Father    Diabetes Brother     Past medical history, surgical history, medications, allergies, family history and social history reviewed with patient today and changes made to appropriate areas of the chart.   Review of Systems  Constitutional: Negative.   Respiratory: Negative.    Cardiovascular: Negative.   Musculoskeletal: Negative.   Neurological: Negative.   Psychiatric/Behavioral: Negative.     All other ROS negative except what is listed above and in the HPI.      Objective:    BP 134/73   Pulse 71   Ht 5\' 3"  (1.6 m)   Wt 171 lb 12.8 oz (77.9 kg)   SpO2 98%   BMI 30.43 kg/m   Wt Readings from Last 3 Encounters:  11/11/23 171 lb 12.8 oz (77.9 kg)  06/18/23 175 lb 4.8 oz (79.5 kg)  05/11/23 169 lb 9.6 oz (76.9 kg)     Physical Exam Vitals and nursing note reviewed.  Constitutional:      General: He is not in acute distress.    Appearance: Normal appearance. He is normal weight. He is not ill-appearing, toxic-appearing or diaphoretic.  HENT:     Head: Normocephalic and atraumatic.     Right Ear: External ear normal.     Left Ear: External ear normal.     Nose: Nose normal.     Mouth/Throat:  Mouth: Mucous membranes are moist.     Pharynx: Oropharynx is clear.  Eyes:     General: No scleral icterus.       Right eye: No discharge.        Left eye: No discharge.     Extraocular Movements: Extraocular movements intact.     Conjunctiva/sclera: Conjunctivae normal.     Pupils: Pupils are equal, round, and  reactive to light.  Cardiovascular:     Rate and Rhythm: Normal rate and regular rhythm.     Pulses: Normal pulses.     Heart sounds: Normal heart sounds. No murmur heard.    No friction rub. No gallop.  Pulmonary:     Effort: Pulmonary effort is normal. No respiratory distress.     Breath sounds: Normal breath sounds. No stridor. No wheezing, rhonchi or rales.  Chest:     Chest wall: No tenderness.  Musculoskeletal:        General: Normal range of motion.     Cervical back: Normal range of motion and neck supple.  Skin:    General: Skin is warm and dry.     Capillary Refill: Capillary refill takes less than 2 seconds.     Coloration: Skin is not jaundiced or pale.     Findings: No bruising, erythema, lesion or rash.  Neurological:     General: No focal deficit present.     Mental Status: He is alert and oriented to person, place, and time. Mental status is at baseline.  Psychiatric:        Mood and Affect: Mood normal.        Behavior: Behavior normal.        Thought Content: Thought content normal.        Judgment: Judgment normal.        11/11/2023    8:58 AM 09/18/2022    1:38 PM 04/15/2021    9:05 AM 04/07/2018    9:16 AM  6CIT Screen  What Year? 0 points 0 points 0 points 0 points  What month? 0 points 0 points 0 points 0 points  What time? 0 points 0 points 0 points 0 points  Count back from 20 0 points 0 points 0 points 0 points  Months in reverse 0 points 0 points 4 points 0 points  Repeat phrase 6 points 6 points 6 points 10 points  Total Score 6 points 6 points 10 points 10 points    Results for orders placed or performed in visit on 06/12/23  PSA  Result Value Ref Range   Prostatic Specific Antigen 0.32 0.00 - 4.00 ng/mL      Assessment & Plan:   Problem List Items Addressed This Visit       Cardiovascular and Mediastinum   Aortic atherosclerosis (HCC)    Will keep blood pressure and cholesterol under good control. Continue to monitor.        Relevant Medications   atorvastatin (LIPITOR) 40 MG tablet   losartan (COZAAR) 50 MG tablet   Other Relevant Orders   Comprehensive metabolic panel   CBC with Differential/Platelet     Genitourinary   Hypertensive kidney disease with CKD stage III (HCC)    Under good control on current regimen. Continue current regimen. Continue to monitor. Call with any concerns. Refills given. Labs drawn today.       Relevant Orders   Comprehensive metabolic panel   CBC with Differential/Platelet   Prostate cancer (HCC)    Continue to  follow with urology. Checking PSA today.      Relevant Orders   Comprehensive metabolic panel   CBC with Differential/Platelet   PSA     Other   Bipolar disorder (HCC)    Under good control on current regimen. Continue current regimen. Continue to monitor. Call with any concerns. Refills given. Labs drawn today.        Relevant Medications   buPROPion (WELLBUTRIN XL) 300 MG 24 hr tablet   divalproex (DEPAKOTE ER) 250 MG 24 hr tablet   Other Relevant Orders   Comprehensive metabolic panel   CBC with Differential/Platelet   Valproic Acid level   Hyperlipidemia    Under good control on current regimen. Continue current regimen. Continue to monitor. Call with any concerns. Refills given. Labs drawn today.       Relevant Medications   atorvastatin (LIPITOR) 40 MG tablet   losartan (COZAAR) 50 MG tablet   Other Relevant Orders   Comprehensive metabolic panel   CBC with Differential/Platelet   Lipid Panel w/o Chol/HDL Ratio   Other Visit Diagnoses     Encounter for annual wellness visit (AWV) in Medicare patient    -  Primary   Preventative care discussed today as below.   Need for COVID-19 vaccine       Covid shot given today   Relevant Orders   Pfizer Comirnaty Covid -19 Vaccine 31yrs and older (Completed)   Needs flu shot       Flu shot given today.   Relevant Orders   Flu Vaccine Trivalent High Dose (Fluad) (Completed)        Preventative  Services:  Health Risk Assessment and Personalized Prevention Plan: Done today Bone Mass Measurements: N/A CVD Screening: Done today Colon Cancer Screening: N/A Depression Screening: Done today Diabetes Screening: Done today Glaucoma Screening: See your eye doctor Hepatitis B vaccine: N/A Hepatitis C screening: up to date HIV Screening: up to date Flu Vaccine:up to date Lung cancer Screening: N/A Obesity Screening: done today Pneumonia Vaccines (2): up to date STI Screening: N/A PSA screening: done today  Discussed aspirin prophylaxis for myocardial infarction prevention and decision was made to continue ASA  LABORATORY TESTING:  Health maintenance labs ordered today as discussed above.   The natural history of prostate cancer and ongoing controversy regarding screening and potential treatment outcomes of prostate cancer has been discussed with the patient. The meaning of a false positive PSA and a false negative PSA has been discussed. He indicates understanding of the limitations of this screening test and wishes to proceed with screening PSA testing.   IMMUNIZATIONS:   - Tdap: Tetanus vaccination status reviewed: last tetanus booster within 10 years. - Influenza: Administered today - Pneumovax: Up to date - Prevnar: Up to date - Zostavax vaccine: Given elsewhere  SCREENING: - Colonoscopy: Not applicable  Discussed with patient purpose of the colonoscopy is to detect colon cancer at curable precancerous or early stages    PATIENT COUNSELING:    Sexuality: Discussed sexually transmitted diseases, partner selection, use of condoms, avoidance of unintended pregnancy  and contraceptive alternatives.   Advised to avoid cigarette smoking.  I discussed with the patient that most people either abstain from alcohol or drink within safe limits (<=14/week and <=4 drinks/occasion for males, <=7/weeks and <= 3 drinks/occasion for females) and that the risk for alcohol disorders and  other health effects rises proportionally with the number of drinks per week and how often a drinker exceeds daily limits.  Discussed cessation/primary  prevention of drug use and availability of treatment for abuse.   Diet: Encouraged to adjust caloric intake to maintain  or achieve ideal body weight, to reduce intake of dietary saturated fat and total fat, to limit sodium intake by avoiding high sodium foods and not adding table salt, and to maintain adequate dietary potassium and calcium preferably from fresh fruits, vegetables, and low-fat dairy products.    stressed the importance of regular exercise  Injury prevention: Discussed safety belts, safety helmets, smoke detector, smoking near bedding or upholstery.   Dental health: Discussed importance of regular tooth brushing, flossing, and dental visits.   Follow up plan: NEXT PREVENTATIVE PHYSICAL DUE IN 1 YEAR. Return in about 6 months (around 05/10/2024) for physical.

## 2023-11-12 LAB — LIPID PANEL W/O CHOL/HDL RATIO
Cholesterol, Total: 126 mg/dL (ref 100–199)
HDL: 42 mg/dL (ref >39)
LDL Chol Calc (NIH): 65 mg/dL (ref 0–99)
Triglycerides: 102 mg/dL (ref 0–149)
VLDL Cholesterol Cal: 19 mg/dL (ref 5–40)

## 2023-11-12 LAB — CBC WITH DIFFERENTIAL/PLATELET
Basophils Absolute: 0 10*3/uL (ref 0.0–0.2)
Basos: 1 %
EOS (ABSOLUTE): 0.1 10*3/uL (ref 0.0–0.4)
Eos: 2 %
Hematocrit: 33.9 % — ABNORMAL LOW (ref 37.5–51.0)
Hemoglobin: 10.8 g/dL — ABNORMAL LOW (ref 13.0–17.7)
Immature Grans (Abs): 0 10*3/uL (ref 0.0–0.1)
Immature Granulocytes: 0 %
Lymphocytes Absolute: 0.7 10*3/uL (ref 0.7–3.1)
Lymphs: 20 %
MCH: 30.3 pg (ref 26.6–33.0)
MCHC: 31.9 g/dL (ref 31.5–35.7)
MCV: 95 fL (ref 79–97)
Monocytes Absolute: 0.4 10*3/uL (ref 0.1–0.9)
Monocytes: 11 %
Neutrophils Absolute: 2.3 10*3/uL (ref 1.4–7.0)
Neutrophils: 66 %
Platelets: 152 10*3/uL (ref 150–450)
RBC: 3.57 x10E6/uL — ABNORMAL LOW (ref 4.14–5.80)
RDW: 12.8 % (ref 11.6–15.4)
WBC: 3.5 10*3/uL (ref 3.4–10.8)

## 2023-11-12 LAB — COMPREHENSIVE METABOLIC PANEL
ALT: 12 [IU]/L (ref 0–44)
AST: 13 [IU]/L (ref 0–40)
Albumin: 3.8 g/dL (ref 3.8–4.8)
Alkaline Phosphatase: 58 [IU]/L (ref 44–121)
BUN/Creatinine Ratio: 10 (ref 10–24)
BUN: 14 mg/dL (ref 8–27)
Bilirubin Total: 0.7 mg/dL (ref 0.0–1.2)
CO2: 24 mmol/L (ref 20–29)
Calcium: 8.9 mg/dL (ref 8.6–10.2)
Chloride: 105 mmol/L (ref 96–106)
Creatinine, Ser: 1.41 mg/dL — ABNORMAL HIGH (ref 0.76–1.27)
Globulin, Total: 2.5 g/dL (ref 1.5–4.5)
Glucose: 90 mg/dL (ref 70–99)
Potassium: 4.2 mmol/L (ref 3.5–5.2)
Sodium: 145 mmol/L — ABNORMAL HIGH (ref 134–144)
Total Protein: 6.3 g/dL (ref 6.0–8.5)
eGFR: 51 mL/min/{1.73_m2} — ABNORMAL LOW (ref >59)

## 2023-11-12 LAB — PSA: Prostate Specific Ag, Serum: 0.2 ng/mL (ref 0.0–4.0)

## 2023-11-12 LAB — VALPROIC ACID LEVEL: Valproic Acid Lvl: 44 ug/mL — ABNORMAL LOW (ref 50–100)

## 2023-11-17 ENCOUNTER — Ambulatory Visit: Payer: Medicare Other | Attending: Cardiovascular Disease

## 2023-11-17 DIAGNOSIS — I351 Nonrheumatic aortic (valve) insufficiency: Secondary | ICD-10-CM

## 2023-11-17 LAB — ECHOCARDIOGRAM COMPLETE
AR max vel: 1.76 cm2
AV Area VTI: 1.77 cm2
AV Area mean vel: 1.69 cm2
AV Mean grad: 12 mm[Hg]
AV Peak grad: 22.1 mm[Hg]
Ao pk vel: 2.35 m/s
Area-P 1/2: 2.73 cm2
P 1/2 time: 393 ms
S' Lateral: 3.5 cm

## 2023-11-18 ENCOUNTER — Other Ambulatory Visit: Payer: Self-pay

## 2023-11-18 ENCOUNTER — Telehealth: Payer: Self-pay | Admitting: Cardiovascular Disease

## 2023-11-18 DIAGNOSIS — I351 Nonrheumatic aortic (valve) insufficiency: Secondary | ICD-10-CM

## 2023-11-18 NOTE — Telephone Encounter (Signed)
-----   Message from Nurse Kathrene Alu sent at 11/18/2023  1:32 PM EST ----- Pt scheduled pt for an ECHO in 1 year.  Thanks

## 2023-11-18 NOTE — Telephone Encounter (Signed)
Left voicemail to schedule 90yr echo appt.

## 2023-12-17 ENCOUNTER — Inpatient Hospital Stay: Payer: Medicare Other | Attending: Radiation Oncology

## 2023-12-17 DIAGNOSIS — C61 Malignant neoplasm of prostate: Secondary | ICD-10-CM | POA: Insufficient documentation

## 2023-12-17 LAB — PSA: Prostatic Specific Antigen: 0.15 ng/mL (ref 0.00–4.00)

## 2023-12-24 ENCOUNTER — Other Ambulatory Visit: Payer: Self-pay | Admitting: *Deleted

## 2023-12-24 ENCOUNTER — Ambulatory Visit
Admission: RE | Admit: 2023-12-24 | Discharge: 2023-12-24 | Disposition: A | Discharge status: Home / Self Care | Payer: Medicare Other | Source: Ambulatory Visit | Attending: Radiation Oncology | Admitting: Radiation Oncology

## 2023-12-24 VITALS — BP 145/74 | HR 75 | Temp 97.3°F | Resp 16 | Ht 63.0 in | Wt 172.0 lb

## 2023-12-24 DIAGNOSIS — Z923 Personal history of irradiation: Secondary | ICD-10-CM | POA: Insufficient documentation

## 2023-12-24 DIAGNOSIS — C61 Malignant neoplasm of prostate: Secondary | ICD-10-CM | POA: Insufficient documentation

## 2023-12-24 DIAGNOSIS — Z191 Hormone sensitive malignancy status: Secondary | ICD-10-CM | POA: Diagnosis not present

## 2023-12-24 MED ORDER — TAMSULOSIN HCL 0.4 MG PO CAPS: 0.4000 mg | ORAL_CAPSULE | Freq: Every day | ORAL | 4 refills | Status: DC

## 2023-12-24 MED ORDER — TAMSULOSIN HCL 0.4 MG PO CAPS: 0.4000 mg | ORAL_CAPSULE | Freq: Every day | ORAL | 3 refills | Status: AC

## 2023-12-24 NOTE — Progress Notes (Signed)
Radiation Oncology Follow up Note  Name: Dean Larsen   Date:   12/24/2023 MRN:  [id number] DOB: 05/12/1946    This 77 y.o. male presents to the clinic today for 64-month follow-up status post IMRT radiation therapy for stage IIIb Gleason 7 adenocarcinoma presented with a PSA of 21.  REFERRING PROVIDER: Dorcas Carrow, DO  HPI: Patient is a 77 year old male now out 10 months having completed IMRT radiation therapy for stage IIIb Gleason 7 adenocarcinoma the prostate presenting with a PSA of 21 seen today in routine follow-up he is doing well he specifically denies any increased lower urinary tract symptoms bone pain or diarrhea.  His most recent PSA from this month was 0.15.  COMPLICATIONS OF TREATMENT: none  FOLLOW UP COMPLIANCE: keeps appointments   PHYSICAL EXAM:  BP (!) 145/74   Pulse 75   Temp (!) 97.3 F (36.3 C) (Tympanic)   Resp 16   Ht 5\' 3"  (1.6 m)   Wt 172 lb (78 kg)   BMI 30.47 kg/m  Well-developed well-nourished patient in NAD. HEENT reveals PERLA, EOMI, discs not visualized.  Oral cavity is clear. No oral mucosal lesions are identified. Neck is clear without evidence of cervical or supraclavicular adenopathy. Lungs are clear to A&P. Cardiac examination is essentially unremarkable with regular rate and rhythm without murmur rub or thrill. Abdomen is benign with no organomegaly or masses noted. Motor sensory and DTR levels are equal and symmetric in the upper and lower extremities. Cranial nerves II through XII are grossly intact. Proprioception is intact. No peripheral adenopathy or edema is identified. No motor or sensory levels are noted. Crude visual fields are within normal range.  RADIOLOGY RESULTS: No current films for review  PLAN: Present time patient is doing well under excellent biochemical control of his prostate cancer with very low side effect profile.  And pleased with his overall progress.  I have asked to see him back in 6 months for follow-up with a  repeat PSA.  Patient knows to call with any concerns.  I would like to take this opportunity to thank you for allowing me to participate in the care of your patient.Carmina Miller, MD

## 2024-01-14 ENCOUNTER — Ambulatory Visit: Payer: Medicare HMO | Attending: Cardiovascular Disease | Admitting: Cardiovascular Disease

## 2024-01-14 ENCOUNTER — Encounter: Payer: Self-pay | Admitting: Cardiovascular Disease

## 2024-01-14 VITALS — BP 142/60 | HR 64 | Ht 63.0 in | Wt 172.4 lb

## 2024-01-14 DIAGNOSIS — N183 Chronic kidney disease, stage 3 unspecified: Secondary | ICD-10-CM

## 2024-01-14 DIAGNOSIS — E785 Hyperlipidemia, unspecified: Secondary | ICD-10-CM | POA: Diagnosis not present

## 2024-01-14 DIAGNOSIS — I10 Essential (primary) hypertension: Secondary | ICD-10-CM | POA: Diagnosis not present

## 2024-01-14 DIAGNOSIS — Q2381 Bicuspid aortic valve: Secondary | ICD-10-CM

## 2024-01-14 NOTE — Patient Instructions (Signed)
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
## 2024-05-10 ENCOUNTER — Ambulatory Visit (INDEPENDENT_AMBULATORY_CARE_PROVIDER_SITE_OTHER): Payer: Self-pay | Admitting: Family Medicine

## 2024-05-10 ENCOUNTER — Encounter: Payer: Self-pay | Admitting: Family Medicine

## 2024-05-10 VITALS — BP 138/62 | HR 79 | Wt 175.8 lb

## 2024-05-10 DIAGNOSIS — F3170 Bipolar disorder, currently in remission, most recent episode unspecified: Secondary | ICD-10-CM

## 2024-05-10 DIAGNOSIS — I129 Hypertensive chronic kidney disease with stage 1 through stage 4 chronic kidney disease, or unspecified chronic kidney disease: Secondary | ICD-10-CM

## 2024-05-10 DIAGNOSIS — N183 Chronic kidney disease, stage 3 unspecified: Secondary | ICD-10-CM | POA: Diagnosis not present

## 2024-05-10 DIAGNOSIS — M25552 Pain in left hip: Secondary | ICD-10-CM | POA: Diagnosis not present

## 2024-05-10 DIAGNOSIS — W19XXXA Unspecified fall, initial encounter: Secondary | ICD-10-CM

## 2024-05-10 DIAGNOSIS — C61 Malignant neoplasm of prostate: Secondary | ICD-10-CM

## 2024-05-10 DIAGNOSIS — E785 Hyperlipidemia, unspecified: Secondary | ICD-10-CM

## 2024-05-10 MED ORDER — RISPERIDONE 1 MG PO TABS: 2.5000 mg | ORAL_TABLET | Freq: Every day | ORAL | 1 refills | Status: DC

## 2024-05-10 MED ORDER — LOSARTAN POTASSIUM 50 MG PO TABS: 100.0000 mg | ORAL_TABLET | Freq: Every day | ORAL | 1 refills | Status: DC

## 2024-05-10 MED ORDER — BUPROPION HCL ER (XL) 300 MG PO TB24: 300.0000 mg | ORAL_TABLET | Freq: Every day | ORAL | 1 refills | Status: DC

## 2024-05-10 MED ORDER — DULOXETINE HCL 20 MG PO CPEP: 40.0000 mg | ORAL_CAPSULE | Freq: Every day | ORAL | 1 refills | Status: DC

## 2024-05-10 MED ORDER — OMEPRAZOLE 40 MG PO CPDR: 40.0000 mg | DELAYED_RELEASE_CAPSULE | Freq: Every day | ORAL | 1 refills | Status: DC

## 2024-05-10 MED ORDER — DIVALPROEX SODIUM ER 250 MG PO TB24: 250.0000 mg | ORAL_TABLET | Freq: Two times a day (BID) | ORAL | 1 refills | Status: DC

## 2024-05-10 NOTE — Assessment & Plan Note (Signed)
 Under good control on current regimen. Continue current regimen. Continue to monitor. Call with any concerns. Refills given. Labs drawn today.

## 2024-05-10 NOTE — Assessment & Plan Note (Signed)
 Continue to follow with oncology. Call with any concerns. Continue to monitor.

## 2024-05-10 NOTE — Progress Notes (Signed)
 BP 138/62   Pulse 79   Wt 175 lb 12.8 oz (79.7 kg)   SpO2 96%   BMI 31.14 kg/m    Subjective:    Patient ID: Dean Larsen, male    DOB: [birth year], 78 y.o.   MRN: [id number]  HPI: Dean Larsen is a 78 y.o. male  Chief Complaint  Patient presents with   Fall    Pt fell a couple weeks ago. Injury to left side. Has concerns of broken pelvis. Has broken pelvis before about 20 years. Pt states that it feels about the same when he broke it before.    Hypertension   Medication Refill   HYPERTENSION / HYPERLIPIDEMIA Satisfied with current treatment? yes Duration of hypertension: chronic BP monitoring frequency: not checking BP medication side effects: no Past BP meds: losartan  Duration of hyperlipidemia: chronic Cholesterol medication side effects: no Cholesterol supplements: none Past cholesterol medications: atorvastatin  Medication compliance: excellent compliance Aspirin : yes Recent stressors: no Recurrent headaches: no Visual changes: no Palpitations: no Dyspnea: no Chest pain: no Lower extremity edema: no Dizzy/lightheaded: no  DEPRESSION Mood status: controlled Satisfied with current treatment?: yes Symptom severity: mild  Duration of current treatment : chronic Side effects: no Medication compliance: excellent compliance Psychotherapy/counseling: no  Previous psychiatric medications: wellbutrin , depakote , cymbalta , risperidone  Depressed mood: no Anxious mood: no Anhedonia: no Significant weight loss or gain: no Insomnia: no  Fatigue: yes Feelings of worthlessness or guilt: no Impaired concentration/indecisiveness: no Suicidal ideations: no Hopelessness: no Crying spells: no    05/10/2024    8:49 AM 11/11/2023    8:51 AM 05/11/2023    9:20 AM 09/18/2022    1:26 PM 05/12/2022   10:15 AM  Depression screen PHQ 2/9  Decreased Interest 0 0 0 0 0  Down, Depressed, Hopeless 0 0 0 0 0  PHQ - 2 Score 0 0 0 0 0  Altered sleeping 0  0 0 0  Tired,  decreased energy 0  0 0 0  Change in appetite 0  0 0 0  Feeling bad or failure about yourself  0  0 0 0  Trouble concentrating 0  0 0 0  Moving slowly or fidgety/restless 0  0 0 0  Suicidal thoughts 0  0 0 0  PHQ-9 Score 0  0 0 0  Difficult doing work/chores   Not difficult at all Not difficult at all Not difficult at all   [place] about 2 weeks ago trying to get his sock on. Feels like his hip is hurting like it was when he broke it previously. He has been able to walk. No other concerns or complaints at this time.   Relevant past medical, surgical, family and social history reviewed and updated as indicated. Interim medical history since our last visit reviewed. Allergies and medications reviewed and updated.  Review of Systems  Constitutional: Negative.   Respiratory: Negative.    Cardiovascular: Negative.   Musculoskeletal:  Positive for arthralgias, gait problem and myalgias. Negative for back pain, joint swelling, neck pain and neck stiffness.  Skin: Negative.   Psychiatric/Behavioral: Negative.      Per HPI unless specifically indicated above     Objective:     BP 138/62   Pulse 79   Wt 175 lb 12.8 oz (79.7 kg)   SpO2 96%   BMI 31.14 kg/m   Wt Readings from Last 3 Encounters:  05/10/24 175 lb 12.8 oz (79.7 kg)  01/14/24 172 lb 6 oz (78.2 kg)  12/24/23 172 lb (78 kg)    Physical Exam Vitals and nursing note reviewed.  Constitutional:      General: He is not in acute distress.    Appearance: Normal appearance. He is obese. He is not ill-appearing, toxic-appearing or diaphoretic.  HENT:     Head: Normocephalic and atraumatic.     Right Ear: External ear normal.     Left Ear: External ear normal.     Nose: Nose normal.     Mouth/Throat:     Mouth: Mucous membranes are moist.     Pharynx: Oropharynx is clear.  Eyes:     General: No scleral icterus.       Right eye: No discharge.        Left eye: No discharge.     Extraocular Movements: Extraocular movements  intact.     Conjunctiva/sclera: Conjunctivae normal.     Pupils: Pupils are equal, round, and reactive to light.  Cardiovascular:     Rate and Rhythm: Normal rate and regular rhythm.     Pulses: Normal pulses.     Heart sounds: Normal heart sounds. No murmur heard.    No friction rub. No gallop.  Pulmonary:     Effort: Pulmonary effort is normal. No respiratory distress.     Breath sounds: Normal breath sounds. No stridor. No wheezing, rhonchi or rales.  Chest:     Chest wall: No tenderness.  Musculoskeletal:        General: Normal range of motion.     Cervical back: Normal range of motion and neck supple.  Skin:    General: Skin is warm and dry.     Capillary Refill: Capillary refill takes less than 2 seconds.     Coloration: Skin is not jaundiced or pale.     Findings: No bruising, erythema, lesion or rash.  Neurological:     General: No focal deficit present.     Mental Status: He is alert and oriented to person, place, and time. Mental status is at baseline.  Psychiatric:        Mood and Affect: Mood normal.        Behavior: Behavior normal.        Thought Content: Thought content normal.        Judgment: Judgment normal.     Results for orders placed or performed in visit on 12/17/23  PSA   Collection Time: 12/17/23  8:51 AM  Result Value Ref Range   Prostatic Specific Antigen 0.15 0.00 - 4.00 ng/mL      Assessment & Plan:   Problem List Items Addressed This Visit       Genitourinary   Hypertensive kidney disease with CKD stage III (HCC) - Primary   Under good control on current regimen. Continue current regimen. Continue to monitor. Call with any concerns. Refills given. Labs drawn today.        Relevant Orders   CBC with Differential/Platelet   Comprehensive metabolic panel with GFR   Ambulatory referral to Home Health   Prostate cancer [facility])   Continue to follow with oncology. Call with any concerns. Continue to monitor.       Relevant Orders    Ambulatory referral to Home Health     Other   Hyperlipidemia   Under good control on current regimen. Continue current regimen. Continue to monitor. Call with any concerns. Refills given. Labs drawn today.       Relevant Medications   losartan  (COZAAR ) 50 MG  tablet   Other Relevant Orders   CBC with Differential/Platelet   Comprehensive metabolic panel with GFR   Lipid Panel w/o Chol/HDL Ratio   Ambulatory referral to Home Health   RESOLVED: Bipolar disorder (HCC)   Under good control on current regimen. Continue current regimen. Continue to monitor. Call with any concerns. Refills given. Labs drawn today.       Relevant Medications   buPROPion  (WELLBUTRIN  XL) 300 MG 24 hr tablet   divalproex  (DEPAKOTE  ER) 250 MG 24 hr tablet   Other Relevant Orders   Valproic Acid  level   Ambulatory referral to Home Health   Other Visit Diagnoses       Left hip pain       Will check x-ray and get him PT. Call with any concerns or getting worse.   Relevant Orders   DG Hip Unilat W OR W/O Pelvis 2-3 Views Left   Ambulatory referral to [facility]     Fall, initial encounter       Will check x-ray and get him PT. Call with any concerns or getting worse.   Relevant Orders   Ambulatory referral to Home Health        Follow up plan: Return in about 2 months (around 07/10/2024).

## 2024-05-11 ENCOUNTER — Telehealth: Payer: Self-pay | Admitting: Family Medicine

## 2024-05-11 ENCOUNTER — Ambulatory Visit
Admission: RE | Admit: 2024-05-11 | Discharge: 2024-05-11 | Disposition: A | Discharge status: Home / Self Care | Attending: Family Medicine | Admitting: Family Medicine

## 2024-05-11 ENCOUNTER — Ambulatory Visit
Admission: RE | Admit: 2024-05-11 | Discharge: 2024-05-11 | Disposition: A | Discharge status: Home / Self Care | Source: Ambulatory Visit | Attending: Family Medicine

## 2024-05-11 DIAGNOSIS — M25552 Pain in left hip: Secondary | ICD-10-CM | POA: Diagnosis present

## 2024-05-11 LAB — LIPID PANEL W/O CHOL/HDL RATIO
Cholesterol, Total: 167 mg/dL (ref 100–199)
HDL: 37 mg/dL — ABNORMAL LOW (ref >39)
LDL Chol Calc (NIH): 93 mg/dL (ref 0–99)
Triglycerides: 219 mg/dL — ABNORMAL HIGH (ref 0–149)
VLDL Cholesterol Cal: 37 mg/dL (ref 5–40)

## 2024-05-11 LAB — CBC WITH DIFFERENTIAL/PLATELET
Basophils Absolute: 0 10*3/uL (ref 0.0–0.2)
Basos: 1 %
EOS (ABSOLUTE): 0.2 10*3/uL (ref 0.0–0.4)
Eos: 4 %
Hematocrit: 36.9 % — ABNORMAL LOW (ref 37.5–51.0)
Hemoglobin: 11.7 g/dL — ABNORMAL LOW (ref 13.0–17.7)
Immature Grans (Abs): 0 10*3/uL (ref 0.0–0.1)
Immature Granulocytes: 0 %
Lymphocytes Absolute: 0.9 10*3/uL (ref 0.7–3.1)
Lymphs: 23 %
MCH: 30.2 pg (ref 26.6–33.0)
MCHC: 31.7 g/dL (ref 31.5–35.7)
MCV: 95 fL (ref 79–97)
Monocytes Absolute: 0.5 10*3/uL (ref 0.1–0.9)
Monocytes: 13 %
Neutrophils Absolute: 2.3 10*3/uL (ref 1.4–7.0)
Neutrophils: 59 %
Platelets: 156 10*3/uL (ref 150–450)
RBC: 3.87 x10E6/uL — ABNORMAL LOW (ref 4.14–5.80)
RDW: 13.2 % (ref 11.6–15.4)
WBC: 3.8 10*3/uL (ref 3.4–10.8)

## 2024-05-11 LAB — COMPREHENSIVE METABOLIC PANEL WITH GFR
ALT: 21 IU/L (ref 0–44)
AST: 21 IU/L (ref 0–40)
Albumin: 4 g/dL (ref 3.8–4.8)
Alkaline Phosphatase: 70 IU/L (ref 44–121)
BUN/Creatinine Ratio: 10 (ref 10–24)
BUN: 12 mg/dL (ref 8–27)
Bilirubin Total: 0.8 mg/dL (ref 0.0–1.2)
CO2: 24 mmol/L (ref 20–29)
Calcium: 8.9 mg/dL (ref 8.6–10.2)
Chloride: 102 mmol/L (ref 96–106)
Creatinine, Ser: 1.16 mg/dL (ref 0.76–1.27)
Globulin, Total: 2.3 g/dL (ref 1.5–4.5)
Glucose: 137 mg/dL — ABNORMAL HIGH (ref 70–99)
Potassium: 4 mmol/L (ref 3.5–5.2)
Sodium: 141 mmol/L (ref 134–144)
Total Protein: 6.3 g/dL (ref 6.0–8.5)
eGFR: 64 mL/min/{1.73_m2} (ref >59)

## 2024-05-11 LAB — VALPROIC ACID LEVEL: Valproic Acid Lvl: 8 ug/mL — ABNORMAL LOW (ref 50–100)

## 2024-05-11 NOTE — Telephone Encounter (Signed)
 Copied from CRM [id number]. Topic: Clinical - Medical Advice >> May 11, 2024  2:36 PM Marissa P wrote: Reason for CRM: [place]  from [facility] called to verify that homehealth physical therapy for this patient has been accepted for start of care next week.

## 2024-05-13 ENCOUNTER — Ambulatory Visit: Payer: Self-pay | Admitting: Family Medicine

## 2024-05-16 ENCOUNTER — Telehealth: Payer: Self-pay

## 2024-05-16 NOTE — Telephone Encounter (Signed)
 Copied from CRM [id number]. Topic: Clinical - Home Health Verbal Orders >> May 16, 2024  2:57 PM Baldomero Bone wrote: Caller/Agency: Shantia with [facility] Callback Number: [phone] Service Requested: Physical Therapy Frequency: Assessment will be done on 05/17/2024 Any new concerns about the patient? No

## 2024-06-16 ENCOUNTER — Inpatient Hospital Stay: Payer: Medicare Other | Attending: Radiation Oncology

## 2024-06-16 DIAGNOSIS — C61 Malignant neoplasm of prostate: Secondary | ICD-10-CM | POA: Insufficient documentation

## 2024-06-17 ENCOUNTER — Inpatient Hospital Stay

## 2024-06-17 DIAGNOSIS — C61 Malignant neoplasm of prostate: Secondary | ICD-10-CM | POA: Diagnosis present

## 2024-06-17 LAB — PSA: Prostatic Specific Antigen: 0.05 ng/mL (ref 0.00–4.00)

## 2024-06-23 ENCOUNTER — Encounter: Payer: Self-pay | Admitting: Radiation Oncology

## 2024-06-23 ENCOUNTER — Ambulatory Visit
Admission: RE | Admit: 2024-06-23 | Discharge: 2024-06-23 | Disposition: A | Discharge status: Home / Self Care | Payer: Medicare Other | Source: Ambulatory Visit | Attending: Radiation Oncology | Admitting: Radiation Oncology

## 2024-06-23 VITALS — BP 179/82 | HR 73 | Temp 97.2°F | Resp 16 | Ht 63.0 in | Wt 176.9 lb

## 2024-06-23 DIAGNOSIS — Z923 Personal history of irradiation: Secondary | ICD-10-CM | POA: Diagnosis not present

## 2024-06-23 DIAGNOSIS — C61 Malignant neoplasm of prostate: Secondary | ICD-10-CM | POA: Insufficient documentation

## 2024-06-23 NOTE — Progress Notes (Signed)
 Radiation Oncology Follow up Note  Name: Dean Larsen   Date:   06/23/2024 MRN:  [id number] DOB: September 16, 1946    This 78 y.o. male presents to the clinic today for 91-month follow-up status post image guided IMRT radiation therapy for stage IIIb Gleason 7 adenocarcinoma presenting with a PSA of 21.  REFERRING PROVIDER: Vicci Duwaine SQUIBB, DO  HPI: Patient is a 78 year old male now out 16 months having completed image guided IMRT radiation therapy for stage IIIb Gleason 7 adenocarcinoma the prostate seen today in routine follow-up he is doing well specifically denies any increased lower Neri tract symptoms diarrhea or fatigue.  His most recent PSA is 0.05.SABRA  He had a plain film of his left hip back in May for pain showing no acute fracture moderate left hip osteoarthritis and remote healed right superior and inferior pubic rami fractures.  COMPLICATIONS OF TREATMENT: none  FOLLOW UP COMPLIANCE: keeps appointments   PHYSICAL EXAM:  BP (!) 179/82 Comment: Patient did not take BP meds before appointment  Pulse 73   Temp (!) 97.2 F (36.2 C)   Resp 16   Ht 5' 3 (1.6 m)   Wt 176 lb 14.4 oz (80.2 kg)   BMI 31.34 kg/m  Well-developed well-nourished patient in NAD. HEENT reveals PERLA, EOMI, discs not visualized.  Oral cavity is clear. No oral mucosal lesions are identified. Neck is clear without evidence of cervical or supraclavicular adenopathy. Lungs are clear to A&P. Cardiac examination is essentially unremarkable with regular rate and rhythm without murmur rub or thrill. Abdomen is benign with no organomegaly or masses noted. Motor sensory and DTR levels are equal and symmetric in the upper and lower extremities. Cranial nerves II through XII are grossly intact. Proprioception is intact. No peripheral adenopathy or edema is identified. No motor or sensory levels are noted. Crude visual fields are within normal range.  RADIOLOGY RESULTS: Plain films of hip reviewed  PLAN: Present time  patient is doing well no evidence of disease under excellent biochemical control of his prostate cancer now out 16 months.  I will see him back in 1 year for follow-up with a PSA.  Patient knows to call with any concerns.  I would like to take this opportunity to thank you for allowing me to participate in the care of your patient.SABRA Marcey Penton, MD

## 2024-07-11 ENCOUNTER — Encounter: Payer: Self-pay | Admitting: Family Medicine

## 2024-07-11 ENCOUNTER — Ambulatory Visit (INDEPENDENT_AMBULATORY_CARE_PROVIDER_SITE_OTHER): Admitting: Family Medicine

## 2024-07-11 VITALS — BP 111/63 | HR 70 | Temp 97.5°F | Ht 63.0 in | Wt 174.6 lb

## 2024-07-11 DIAGNOSIS — M25561 Pain in right knee: Secondary | ICD-10-CM

## 2024-07-11 NOTE — Progress Notes (Signed)
 BP 111/63   Pulse 70   Temp (!) 97.5 F (36.4 C) (Oral)   Ht 5' 3 (1.6 m)   Wt 174 lb 9.6 oz (79.2 kg)   SpO2 98%   BMI 30.93 kg/m    Subjective:    Patient ID: Dean Larsen, male    DOB: 01-25-1946, 78 y.o.   MRN: [id number]  HPI: Dean Larsen is a 78 y.o. male  Chief Complaint  Patient presents with   Hip Pain    Patient states that hip got better and declined home health and PT.    Knee Pain    Right knee pain with swelling. Onset about couple weeks ago. Pt states that it feels like his knee is giving out. Denies injury.  However, has had 2 falls within the last month.    KNEE PAIN Duration: months Involved knee: right Mechanism of injury: fell in May and again a couple of weeks ago Location:anterior Onset: sudden Severity: severe  Quality:  sharp Frequency: intermittent Radiation: no Aggravating factors: weight bearing and walking  Alleviating factors: rest  Status: worse Treatments attempted: rest, tylenol  and ibuprofen  Relief with NSAIDs?:  moderate Weakness with weight bearing or walking: no Sensation of giving way: yes Locking: no Popping: no Bruising: no Swelling: yes Redness: no Paresthesias/decreased sensation: no Fevers: no  Relevant past medical, surgical, family and social history reviewed and updated as indicated. Interim medical history since our last visit reviewed. Allergies and medications reviewed and updated.  Review of Systems  Constitutional: Negative.   Respiratory: Negative.    Cardiovascular: Negative.   Musculoskeletal:  Positive for arthralgias, gait problem and joint swelling. Negative for back pain, myalgias, neck pain and neck stiffness.  Skin: Negative.   Psychiatric/Behavioral: Negative.      Per HPI unless specifically indicated above     Objective:    BP 111/63   Pulse 70   Temp (!) 97.5 F (36.4 C) (Oral)   Ht 5' 3 (1.6 m)   Wt 174 lb 9.6 oz (79.2 kg)   SpO2 98%   BMI 30.93 kg/m   Wt Readings  from Last 3 Encounters:  07/11/24 174 lb 9.6 oz (79.2 kg)  06/23/24 176 lb 14.4 oz (80.2 kg)  05/10/24 175 lb 12.8 oz (79.7 kg)    Physical Exam Vitals and nursing note reviewed.  Constitutional:      General: He is not in acute distress.    Appearance: Normal appearance. He is not ill-appearing, toxic-appearing or diaphoretic.  HENT:     Head: Normocephalic and atraumatic.     Right Ear: External ear normal.     Left Ear: External ear normal.     Nose: Nose normal.     Mouth/Throat:     Mouth: Mucous membranes are moist.     Pharynx: Oropharynx is clear.  Eyes:     General: No scleral icterus.       Right eye: No discharge.        Left eye: No discharge.     Extraocular Movements: Extraocular movements intact.     Conjunctiva/sclera: Conjunctivae normal.     Pupils: Pupils are equal, round, and reactive to light.  Cardiovascular:     Rate and Rhythm: Normal rate and regular rhythm.     Pulses: Normal pulses.     Heart sounds: Normal heart sounds. No murmur heard.    No friction rub. No gallop.  Pulmonary:     Effort: Pulmonary effort is  normal. No respiratory distress.     Breath sounds: Normal breath sounds. No stridor. No wheezing, rhonchi or rales.  Chest:     Chest wall: No tenderness.  Musculoskeletal:        General: Swelling and tenderness present. No deformity or signs of injury.     Cervical back: Normal range of motion and neck supple.     Right lower leg: No edema.     Left lower leg: No edema.  Skin:    General: Skin is warm and dry.     Capillary Refill: Capillary refill takes less than 2 seconds.     Coloration: Skin is not jaundiced or pale.     Findings: No bruising, erythema, lesion or rash.  Neurological:     General: No focal deficit present.     Mental Status: He is alert and oriented to person, place, and time. Mental status is at baseline.  Psychiatric:        Mood and Affect: Mood normal.        Behavior: Behavior normal.        Thought  Content: Thought content normal.        Judgment: Judgment normal.     Results for orders placed or performed in visit on 06/17/24  PSA   Collection Time: 06/17/24  8:39 AM  Result Value Ref Range   Prostatic Specific Antigen 0.05 0.00 - 4.00 ng/mL      Assessment & Plan:   Problem List Items Addressed This Visit   None Visit Diagnoses       Acute pain of right knee    -  Primary   Concern for arthritis. Will get him into ortho. Await their input. Call with any concerns.   Relevant Orders   Ambulatory referral to Orthopedic Surgery        Follow up plan: Return in about 4 months (around 11/11/2024) for physical.

## 2024-11-01 ENCOUNTER — Ambulatory Visit: Attending: Cardiovascular Disease | Admitting: Cardiovascular Disease

## 2024-11-01 ENCOUNTER — Encounter: Payer: Self-pay | Admitting: Cardiovascular Disease

## 2024-11-01 VITALS — BP 114/62 | HR 77 | Ht 64.0 in | Wt 176.4 lb

## 2024-11-01 DIAGNOSIS — E785 Hyperlipidemia, unspecified: Secondary | ICD-10-CM | POA: Diagnosis not present

## 2024-11-01 DIAGNOSIS — I10 Essential (primary) hypertension: Secondary | ICD-10-CM

## 2024-11-01 DIAGNOSIS — I351 Nonrheumatic aortic (valve) insufficiency: Secondary | ICD-10-CM | POA: Diagnosis not present

## 2024-11-01 DIAGNOSIS — Q2381 Bicuspid aortic valve: Secondary | ICD-10-CM

## 2024-11-01 NOTE — Patient Instructions (Signed)
 Medication Instructions:  No changes *If you need a refill on your cardiac medications before your next appointment, please call your pharmacy*  Lab Work None ordered If you have labs (blood work) drawn today and your tests are completely normal, you will receive your results only by: MyChart Message (if you have MyChart) OR A paper copy in the mail If you have any lab test that is abnormal or we need to change your treatment, we will call you to review the results.  Testing/Procedures: Your physician has requested that you have an echocardiogram. Echocardiography is a painless test that uses sound waves to create images of your heart. It provides your doctor with information about the size and shape of your heart and how well your heart's chambers and valves are working.   You may receive an ultrasound enhancing agent through an IV if needed to better visualize your heart during the echo. This procedure takes approximately one hour.  There are no restrictions for this procedure.  This will take place at [address] [facility] [address] [facility] [address], [place] [address]  Please note: We ask at that you not bring children with you during ultrasound (echo/ vascular) testing. Due to room size and safety concerns, children are not allowed in the ultrasound rooms during exams. Our front office staff cannot provide observation of children in our lobby area while testing is being conducted. An adult accompanying a patient to their appointment will only be allowed in the ultrasound room at the discretion of the ultrasound technician under special circumstances. We apologize for any inconvenience.   Follow-Up: At [facility], you and your health needs are our priority.  As part of our continuing mission to provide you with exceptional heart care, our providers are all part of one team.  This team includes your primary Cardiologist (physician) and Advanced Practice Providers or APPs  (Physician Assistants and Nurse Practitioners) who all work together to provide you with the care you need, when you need it.  Your next appointment:   12 month(s)  Provider:   You may see Antionette Kirks, MD or one of the following Advanced Practice Providers on your designated Care Team:   Laneta Pintos, NP Gildardo Labrador, PA-C Varney Gentleman, PA-C Cadence Hampstead, PA-C Ronald Cockayne, NP Morey Ar, NP    We recommend signing up for the patient portal called "MyChart".  Sign up information is provided on this After Visit Summary.  MyChart is used to connect with patients for Virtual Visits (Telemedicine).  Patients are able to view lab/test results, encounter notes, upcoming appointments, etc.  Non-urgent messages can be sent to your provider as well.   To learn more about what you can do with MyChart, go to [email or web address].
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
## 2024-11-13 NOTE — Progress Notes (Signed)
 Pharmacy Quality Measure Review  This patient is appearing on a report for being at risk of failing the adherence measure for cholesterol (statin) medications this calendar year.   Medication: atorvastatin  40 mg Last fill date: 10/23/24 for 90 day supply  Insurance report was not up to date. No action needed at this time.   Jenkins Graces, PharmD PGY1 Pharmacy Resident [phone]

## 2024-11-18 ENCOUNTER — Encounter: Admitting: Family Medicine

## 2024-12-01 ENCOUNTER — Ambulatory Visit

## 2024-12-01 DIAGNOSIS — Z23 Encounter for immunization: Secondary | ICD-10-CM

## 2024-12-01 NOTE — Progress Notes (Signed)
 Patient is in office today for a nurse visit for Flu and Covid Immunizations. Patient tolerated both injections well.

## 2024-12-20 ENCOUNTER — Ambulatory Visit

## 2024-12-20 DIAGNOSIS — I351 Nonrheumatic aortic (valve) insufficiency: Secondary | ICD-10-CM | POA: Diagnosis not present

## 2024-12-20 LAB — ECHOCARDIOGRAM COMPLETE
AR max vel: 1.5 cm2
AV Area VTI: 1.62 cm2
AV Area mean vel: 1.46 cm2
AV Mean grad: 12 mmHg
AV Peak grad: 23.6 mmHg
Ao pk vel: 2.43 m/s
Area-P 1/2: 2.96 cm2
P 1/2 time: 468 ms
S' Lateral: 3.2 cm

## 2024-12-21 ENCOUNTER — Ambulatory Visit: Payer: Self-pay | Admitting: Cardiovascular Disease

## 2025-01-09 ENCOUNTER — Encounter: Payer: Self-pay | Admitting: Family Medicine

## 2025-01-09 ENCOUNTER — Ambulatory Visit: Admitting: Family Medicine

## 2025-01-09 VITALS — BP 130/72 | HR 83 | Temp 97.8°F | Ht 64.0 in | Wt 178.6 lb

## 2025-01-09 DIAGNOSIS — E785 Hyperlipidemia, unspecified: Secondary | ICD-10-CM

## 2025-01-09 DIAGNOSIS — Z0000 Encounter for general adult medical examination without abnormal findings: Secondary | ICD-10-CM | POA: Diagnosis not present

## 2025-01-09 DIAGNOSIS — N183 Chronic kidney disease, stage 3 unspecified: Secondary | ICD-10-CM

## 2025-01-09 DIAGNOSIS — F3181 Bipolar II disorder: Secondary | ICD-10-CM

## 2025-01-09 DIAGNOSIS — I129 Hypertensive chronic kidney disease with stage 1 through stage 4 chronic kidney disease, or unspecified chronic kidney disease: Secondary | ICD-10-CM | POA: Diagnosis not present

## 2025-01-09 DIAGNOSIS — Z8546 Personal history of malignant neoplasm of prostate: Secondary | ICD-10-CM | POA: Diagnosis not present

## 2025-01-09 LAB — MICROALBUMIN, URINE WAIVED
Creatinine, Urine Waived: 200 mg/dL (ref 10–300)
Microalb, Ur Waived: 30 mg/L — ABNORMAL HIGH (ref 0–19)
Microalb/Creat Ratio: <30 mg/g

## 2025-01-09 MED ORDER — DULOXETINE HCL 20 MG PO CPEP: 40.0000 mg | ORAL_CAPSULE | Freq: Every day | ORAL | 1 refills | Status: AC

## 2025-01-09 MED ORDER — LOSARTAN POTASSIUM 50 MG PO TABS: 100.0000 mg | ORAL_TABLET | Freq: Every day | ORAL | 1 refills | Status: AC

## 2025-01-09 MED ORDER — DIVALPROEX SODIUM ER 250 MG PO TB24: 250.0000 mg | ORAL_TABLET | Freq: Two times a day (BID) | ORAL | 1 refills | Status: AC

## 2025-01-09 MED ORDER — BUPROPION HCL ER (XL) 300 MG PO TB24: 300.0000 mg | ORAL_TABLET | Freq: Every day | ORAL | 1 refills | Status: AC

## 2025-01-09 MED ORDER — OMEPRAZOLE 40 MG PO CPDR: 40.0000 mg | DELAYED_RELEASE_CAPSULE | Freq: Every day | ORAL | 1 refills | Status: AC

## 2025-01-09 MED ORDER — ATORVASTATIN CALCIUM 40 MG PO TABS: 40.0000 mg | ORAL_TABLET | Freq: Every day | ORAL | 1 refills | Status: AC

## 2025-01-09 MED ORDER — RISPERIDONE 3 MG PO TABS: 3.0000 mg | ORAL_TABLET | Freq: Every day | ORAL | 0 refills | Status: AC

## 2025-01-09 NOTE — Assessment & Plan Note (Addendum)
 S/p treatment. Will recheck labs. Continue to monitor. Call with any concerns.

## 2025-01-09 NOTE — Progress Notes (Signed)
 "  BP 130/72   Pulse 83   Temp 97.8 F (36.6 C) (Oral)   Ht 5' 4 (1.626 m)   Wt 178 lb 9.6 oz (81 kg)   SpO2 98%   BMI 30.66 kg/m    Subjective:    Patient ID: Dean Larsen, male    DOB: [birth year], 79 y.o.   MRN: [id number]  HPI: Dean Larsen is a 79 y.o. male presenting on 01/09/2025 for comprehensive medical examination. Current medical complaints include:  HYPERTENSION / HYPERLIPIDEMIA Satisfied with current treatment? yes Duration of hypertension: chronic BP monitoring frequency: not checking BP medication side effects: no Past BP meds: losartan  Duration of hyperlipidemia: chronic Cholesterol medication side effects: no Cholesterol supplements: none Past cholesterol medications: atorvastatin  Medication compliance: excellent compliance Aspirin : yes Recent stressors: no Recurrent headaches: no Visual changes: no Palpitations: no Dyspnea: no Chest pain: no Lower extremity edema: no Dizzy/lightheaded: no  BIPOLAR- just had an issue a few days ago where he was really irritable and went off the handle Mood status: uncontrolled Satisfied with current treatment?: no Symptom severity: severe  Duration of current treatment : chronic Side effects: no Medication compliance: good compliance Psychotherapy/counseling: no  Previous psychiatric medications: wellbutrin , depakote , cymbalta , risperidal Depressed mood: yes Anxious mood: yes Anhedonia: no Significant weight loss or gain: no Insomnia: no  Fatigue: no Feelings of worthlessness or guilt: no Impaired concentration/indecisiveness: no Suicidal ideations: yes- had an episode a few days ago and threatened to take a whole bottle of his pills Hopelessness: no Crying spells: no    05/10/2024    8:49 AM 11/11/2023    8:51 AM 05/11/2023    9:20 AM 09/18/2022    1:26 PM 05/12/2022   10:15 AM  Depression screen PHQ 2/9  Decreased Interest 0 0 0 0 0  Down, Depressed, Hopeless 0 0 0 0 0  PHQ - 2 Score 0 0 0 0 0   Altered sleeping 0  0 0 0  Tired, decreased energy 0  0 0 0  Change in appetite 0  0 0 0  Feeling bad or failure about yourself  0  0 0 0  Trouble concentrating 0  0 0 0  Moving slowly or fidgety/restless 0  0 0 0  Suicidal thoughts 0  0 0 0  PHQ-9 Score 0   0  0  0   Difficult doing work/chores   Not difficult at all Not difficult at all Not difficult at all     Data saved with a previous flowsheet row definition     He currently lives with: wife, daughter, grandson Interim Problems from his last visit: no  FALL RISK:    01/09/2025    1:38 PM 11/11/2023    8:51 AM 05/11/2023    9:20 AM 09/18/2022    1:26 PM 04/28/2022   10:32 AM  Fall Risk   Falls in the past year? 0 0 0 0 0  Number falls in past yr: 0 0 0 0 0  Injury with Fall? 0 0  0  0  1   Risk for fall due to : No Fall Risks No Fall Risks No Fall Risks No Fall Risks No Fall Risks  Follow up Falls evaluation completed Falls evaluation completed Falls evaluation completed Falls evaluation completed  Falls evaluation completed      Data saved with a previous flowsheet row definition    Depression Screen    05/10/2024    8:49 AM 11/11/2023  8:51 AM 05/11/2023    9:20 AM 09/18/2022    1:26 PM 05/12/2022   10:15 AM  Depression screen PHQ 2/9  Decreased Interest 0 0 0 0 0  Down, Depressed, Hopeless 0 0 0 0 0  PHQ - 2 Score 0 0 0 0 0  Altered sleeping 0  0 0 0  Tired, decreased energy 0  0 0 0  Change in appetite 0  0 0 0  Feeling bad or failure about yourself  0  0 0 0  Trouble concentrating 0  0 0 0  Moving slowly or fidgety/restless 0  0 0 0  Suicidal thoughts 0  0 0 0  PHQ-9 Score 0   0  0  0   Difficult doing work/chores   Not difficult at all Not difficult at all Not difficult at all     Data saved with a previous flowsheet row definition    Advanced Directives Does patient have a HCPOA?    no If yes, name and contact information:  Does patient have a living will or MOST form?  no  Past Medical History:   Past Medical History:  Diagnosis Date   Aortic insufficiency    a. 08/2020 Echo: EF 50-55%, bicuspid AoV, mild-mod AI; b. 07/2021 Echo: EF 55-60%, no rwma, basal-septal hypertrophy, GrI DD, nl RV fxn, mild MR/AS, bicuspid AoV, mild-mod AI. Asc Ao 39mm.   Arthritis    Bicuspid aortic valve    Bipolar disorder (HCC)    Cancer [facility]) October 2023   Prostrate   CVA (cerebral vascular accident) [facility])    Age 37 - no deficits   Depression    Dilated ascending aorta (HCC)    a. 07/2021 Echo: As Ao 39mm.   Diverticulitis of colon    GERD (gastroesophageal reflux disease)    History of cardiovascular stress test    a. 08/2020 MV: EF 61%, no ischemia/scar. Mild coronary artery and AoV Ca2+ noted.   Hyperlipidemia    Hypertension    Iron deficiency anemia     Surgical History:  Past Surgical History:  Procedure Laterality Date   ESOPHAGOGASTRODUODENOSCOPY (EGD) WITH PROPOFOL  N/A 05/23/2016   Procedure: ESOPHAGOGASTRODUODENOSCOPY (EGD) WITH PROPOFOL  with dialation;  Surgeon: Rogelia Copping, MD;  Location: [facility];  Service: Endoscopy;  Laterality: N/A;   ESOPHAGOGASTRODUODENOSCOPY (EGD) WITH PROPOFOL  N/A 06/14/2019   Procedure: ESOPHAGOGASTRODUODENOSCOPY (EGD) WITH PROPOFOL ;  Surgeon: Copping Rogelia, MD;  Location: [facility];  Service: Endoscopy;  Laterality: N/A;   EYE SURGERY     Cataract surgery   HERNIA REPAIR     ORTHOPEDIC SURGERY     Surgery for broken leg and toe    Medications:  Current Outpatient Medications on File Prior to Visit  Medication Sig   aspirin  81 MG tablet Take 81 mg by mouth daily.   tamsulosin  (FLOMAX ) 0.4 MG CAPS capsule Take 1 capsule (0.4 mg total) by mouth daily after supper.   No current facility-administered medications on file prior to visit.    Allergies:  Allergies[1]  Social History:  Social History   Socioeconomic History   Marital status: Married    Spouse name: Not on file   Number of children: Not on file   Years of education:  Not on file   Highest education level: Associate degree: occupational, scientist, product/process development, or vocational program  Occupational History   Occupation: retired   Tobacco Use   Smoking status: Former    Current packs/day: 0.00    Types: Cigarettes  Quit date: 12/29/1978    Years since quitting: 46.0    Passive exposure: Past   Smokeless tobacco: Current    Types: Snuff  Vaping Use   Vaping status: Never Used  Substance and Sexual Activity   Alcohol use: Never   Drug use: Never   Sexual activity: Not Currently    Birth control/protection: None  Other Topics Concern   Not on file  Social History Narrative   Not on file   Social Drivers of Health   Tobacco Use: High Risk (01/09/2025)   Patient History    Smoking Tobacco Use: Former    Smokeless Tobacco Use: Current    Passive Exposure: Past  Physicist, Medical Strain: Low Risk (01/06/2025)   Overall Financial Resource Strain (CARDIA)    Difficulty of Paying Living Expenses: Not hard at all  Food Insecurity: No Food Insecurity (01/06/2025)   Epic    Worried About Programme Researcher, Broadcasting/film/video in the Last Year: Never true    Ran Out of Food in the Last Year: Never true  Transportation Needs: No Transportation Needs (01/06/2025)   Epic    Lack of Transportation (Medical): No    Lack of Transportation (Non-Medical): No  Physical Activity: Inactive (01/06/2025)   Exercise Vital Sign    Days of Exercise per Week: 0 days    Minutes of Exercise per Session: Not on file  Stress: No Stress Concern Present (01/06/2025)   [employer] of Occupational Health - Occupational Stress Questionnaire    Feeling of Stress: Not at all  Social Connections: Socially Isolated (01/09/2025)   Social Connection and Isolation Panel    Frequency of Communication with Friends and Family: Once a week    Frequency of Social Gatherings with Friends and Family: Once a week    Attends Religious Services: Never    Database Administrator or Organizations: No    Attends Tax Inspector Meetings: Never    Marital Status: Married  Catering Manager Violence: Not At Risk (01/09/2025)   Epic    Fear of Current or Ex-Partner: No    Emotionally Abused: No    Physically Abused: No    Sexually Abused: No  Depression (PHQ2-9): Low Risk (05/10/2024)   Depression (PHQ2-9)    PHQ-2 Score: 0  Alcohol Screen: Low Risk (01/09/2025)   Alcohol Screen    Last Alcohol Screening Score (AUDIT): 0  Housing: Unknown (01/06/2025)   Epic    Unable to Pay for Housing in the Last Year: No    Number of Times Moved in the Last Year: Not on file    Homeless in the Last Year: No  Utilities: Not At Risk (01/09/2025)   Epic    Threatened with loss of utilities: No  Health Literacy: Adequate Health Literacy (01/09/2025)   B1300 Health Literacy    Frequency of need for help with medical instructions: Rarely   Tobacco Use History[2] Social History   Substance and Sexual Activity  Alcohol Use Never    Family History:  Family History  Problem Relation Age of Onset   Stroke Father    Diabetes Brother    Cancer Brother     Past medical history, surgical history, medications, allergies, family history and social history reviewed with patient today and changes made to appropriate areas of the chart.   Review of Systems  Constitutional: Negative.   HENT:  Positive for hearing loss. Negative for congestion, ear discharge, ear pain, nosebleeds, sinus pain, sore throat and tinnitus.  Eyes:  Positive for blurred vision. Negative for double vision, photophobia, pain, discharge and redness.  Respiratory:  Positive for shortness of breath. Negative for cough, hemoptysis, sputum production, wheezing and stridor.   Cardiovascular: Negative.   Gastrointestinal: Negative.   Genitourinary: Negative.   Musculoskeletal: Negative.   Skin: Negative.   Neurological: Negative.   Endo/Heme/Allergies: Negative.   Psychiatric/Behavioral:  Positive for depression and suicidal ideas. Negative for  hallucinations, memory loss and substance abuse. The patient is nervous/anxious. The patient does not have insomnia.    All other ROS negative except what is listed above and in the HPI.      Objective:    BP 130/72   Pulse 83   Temp 97.8 F (36.6 C) (Oral)   Ht 5' 4 (1.626 m)   Wt 178 lb 9.6 oz (81 kg)   SpO2 98%   BMI 30.66 kg/m   Wt Readings from Last 3 Encounters:  01/09/25 178 lb 9.6 oz (81 kg)  11/01/24 176 lb 6.4 oz (80 kg)  07/11/24 174 lb 9.6 oz (79.2 kg)    Physical Exam     11/11/2023    8:58 AM 09/18/2022    1:38 PM 04/15/2021    9:05 AM 04/07/2018    9:16 AM  6CIT Screen  What Year? 0 points 0 points 0 points 0 points  What month? 0 points 0 points 0 points 0 points  What time? 0 points 0 points 0 points 0 points  Count back from 20 0 points 0 points 0 points 0 points  Months in reverse 0 points 0 points 4 points 0 points  Repeat phrase 6 points 6 points 6 points 10 points  Total Score 6 points 6 points 10 points 10 points    Results for orders placed or performed in visit on 12/20/24  ECHOCARDIOGRAM COMPLETE   Collection Time: 12/20/24  7:52 AM  Result Value Ref Range   AR max vel 1.50 cm2   AV Peak grad 23.6 mmHg   Ao pk vel 2.43 m/s   S' Lateral 3.20 cm   Area-P 1/2 2.96 cm2   AV Area VTI 1.62 cm2   AV Mean grad 12.0 mmHg   P 1/2 time 468 msec   AV Area mean vel 1.46 cm2   Est EF 60 - 65%       Assessment & Plan:   Problem List Items Addressed This Visit       Genitourinary   Hypertensive kidney disease with CKD stage III (HCC)   Under good control on current regimen. Continue current regimen. Continue to monitor. Call with any concerns. Refills given. Labs drawn today.        Relevant Orders   Comprehensive metabolic panel with GFR   CBC with Differential/Platelet   TSH   Microalbumin, Urine Waived     Other   Bipolar 2 disorder (HCC)   Not doing well. Will increase his risperidone  to 3mg  and recheck in 2-3 weeks. Warning signs  to go to the ER discussed today.       Relevant Orders   Comprehensive metabolic panel with GFR   CBC with Differential/Platelet   Hyperlipidemia   Under good control on current regimen. Continue current regimen. Continue to monitor. Call with any concerns. Refills given. Labs drawn today.       Relevant Medications   atorvastatin  (LIPITOR) 40 MG tablet   losartan  (COZAAR ) 50 MG tablet   Other Relevant Orders   Comprehensive metabolic panel  with GFR   CBC with Differential/Platelet   Lipid Panel w/o Chol/HDL Ratio   History of prostate cancer   S/p treatment. Will recheck labs. Continue to monitor. Call with any concerns.       Relevant Orders   Comprehensive metabolic panel with GFR   CBC with Differential/Platelet   PSA   Other Visit Diagnoses       Encounter for annual wellness visit (AWV) in Medicare patient    -  Primary     Routine general medical examination at a health care facility            Preventative Services:  Health Risk Assessment and Personalized Prevention Plan: done today Bone Mass Measurements: N/A CVD Screening: done today Colon Cancer Screening: N/A Depression Screening: done today Diabetes Screening: done today Glaucoma Screening: see you're eye doctor Hepatitis B vaccine: N/A Hepatitis C screening: up to date HIV Screening: up to date Flu Vaccine: up to date Lung cancer Screening: N/A Obesity Screening: done today Pneumonia Vaccines (2): up to date STI Screening: N/A PSA screening: Done today  Discussed aspirin  prophylaxis for myocardial infarction prevention and decision was made to continue ASA  LABORATORY TESTING:  Health maintenance labs ordered today as discussed above.   The natural history of prostate cancer and ongoing controversy regarding screening and potential treatment outcomes of prostate cancer has been discussed with the patient. The meaning of a false positive PSA and a false negative PSA has been discussed. He  indicates understanding of the limitations of this screening test and wishes to proceed with screening PSA testing.   IMMUNIZATIONS:   - Tdap: Tetanus vaccination status reviewed: last tetanus booster within 10 years. - Influenza: Up to date - Pneumovax: Up to date - Prevnar: Up to date - Zostavax vaccine: Refused  SCREENING: - Colonoscopy: Not applicable  Discussed with patient purpose of the colonoscopy is to detect colon cancer at curable precancerous or early stages   PATIENT COUNSELING:    Sexuality: Discussed sexually transmitted diseases, partner selection, use of condoms, avoidance of unintended pregnancy  and contraceptive alternatives.   Advised to avoid cigarette smoking.  I discussed with the patient that most people either abstain from alcohol or drink within safe limits (<=14/week and <=4 drinks/occasion for males, <=7/weeks and <= 3 drinks/occasion for females) and that the risk for alcohol disorders and other health effects rises proportionally with the number of drinks per week and how often a drinker exceeds daily limits.  Discussed cessation/primary prevention of drug use and availability of treatment for abuse.   Diet: Encouraged to adjust caloric intake to maintain  or achieve ideal body weight, to reduce intake of dietary saturated fat and total fat, to limit sodium intake by avoiding high sodium foods and not adding table salt, and to maintain adequate dietary potassium and calcium  preferably from fresh fruits, vegetables, and low-fat dairy products.    stressed the importance of regular exercise  Injury prevention: Discussed safety belts, safety helmets, smoke detector, smoking near bedding or upholstery.   Dental health: Discussed importance of regular tooth brushing, flossing, and dental visits.   Follow up plan: NEXT PREVENTATIVE PHYSICAL DUE IN 1 YEAR. Return 2-3 weeks OK to double book.     [1]  Allergies Allergen Reactions   Lisinopril      Codeine Rash   Penicillin G Benzathine Rash   Percocet [Oxycodone-Acetaminophen ] Rash  [2]  Social History Tobacco Use  Smoking Status Former   Current packs/day: 0.00   Types:  Cigarettes   Quit date: 12/29/1978   Years since quitting: 46.0   Passive exposure: Past  Smokeless Tobacco Current   Types: Snuff   "

## 2025-01-09 NOTE — Assessment & Plan Note (Signed)
 Under good control on current regimen. Continue current regimen. Continue to monitor. Call with any concerns. Refills given. Labs drawn today.

## 2025-01-09 NOTE — Patient Instructions (Signed)
 Dean Larsen,  Thank you for taking the time for your Medicare Wellness Visit. I appreciate your continued commitment to your health goals. Please review the care plan we discussed, and feel free to reach out if I can assist you further.  Please note that Annual Wellness Visits do not include a physical exam. Some assessments may be limited, especially if the visit was conducted virtually. If needed, we may recommend an in-person follow-up with your provider.  Ongoing Care Seeing your primary care provider every 3 to 6 months helps us  monitor your health and provide consistent, personalized care.   Referrals If a referral was made during today's visit and you haven't received any updates within two weeks, please contact the referred provider directly to check on the status.  Recommended Screenings:  Health Maintenance  Topic Date Due   Zoster (Shingles) Vaccine (1 of 2) Never done   Medicare Annual Wellness Visit  11/10/2024   COVID-19 Vaccine (5 - Pfizer risk 2025-26 season) 06/01/2025   DTaP/Tdap/Td vaccine (3 - Td or Tdap) 08/15/2030   Pneumococcal Vaccine for age over 27  Completed   Flu Shot  Completed   Hepatitis C Screening  Completed   Meningitis B Vaccine  Aged Out   Colon Cancer Screening  Discontinued       01/09/2025    1:38 PM  Advanced Directives  Does Patient Have a Medical Advance Directive? No  Would patient like information on creating a medical advance directive? No - Patient declined    Vision: Annual vision screenings are recommended for early detection of glaucoma, cataracts, and diabetic retinopathy. These exams can also reveal signs of chronic conditions such as diabetes and high blood pressure.  Dental: Annual dental screenings help detect early signs of oral cancer, gum disease, and other conditions linked to overall health, including heart disease and diabetes.  Please see the attached documents for additional preventive care recommendations.    Preventative Services:  Health Risk Assessment and Personalized Prevention Plan: done today Bone Mass Measurements: N/A CVD Screening: done today Colon Cancer Screening: N/A Depression Screening: done today Diabetes Screening: done today Glaucoma Screening: see you're eye doctor Hepatitis B vaccine: N/A Hepatitis C screening: up to date HIV Screening: up to date Flu Vaccine: up to date Lung cancer Screening: N/A Obesity Screening: done today Pneumonia Vaccines (2): up to date STI Screening: N/A PSA screening: Done today

## 2025-01-09 NOTE — Progress Notes (Signed)
 "  Chief Complaint  Patient presents with   Medicare Wellness     Subjective:   Dean Larsen is a 79 y.o. male who presents for a Medicare Annual Wellness Visit.  Visit info / Clinical Intake: Medicare Wellness Visit Type:: Subsequent Annual Wellness Visit Persons participating in visit and providing information:: patient Medicare Wellness Visit Mode:: In-person (required for WTM) Interpreter Needed?: No Living arrangements:: with family/others; lives with spouse/significant other Patient's Overall Health Status Rating: very good Typical amount of pain: none Does pain affect daily life?: no Are you currently prescribed opioids?: no  Dietary Habits and Nutritional Risks How many meals a day?: 3 Eats fruit and vegetables daily?: yes Most meals are obtained by: preparing own meals; eating out In the last 2 weeks, have you had any of the following?: none Diabetic:: no  Functional Status Activities of Daily Living (to include ambulation/medication): Independent Ambulation: Independent Medication Administration: Independent Home Management (perform basic housework or laundry): Independent Manage your own finances?: (!) no (Wife and daugther) Primary transportation is: family / friends Concerns about vision?: no *vision screening is required for WTM* Concerns about hearing?: no  Fall Screening Falls in the past year?: 0 Number of falls in past year: 0 Was there an injury with Fall?: 0 Fall Risk Category Calculator: 0 Patient Fall Risk Level: Low Fall Risk  Fall Risk Patient at Risk for Falls Due to: No Fall Risks Fall risk Follow up: Falls evaluation completed  Home and Transportation Safety: All rugs have non-skid backing?: yes All stairs or steps have railings?: yes Grab bars in the bathtub or shower?: yes Have non-skid surface in bathtub or shower?: yes Good home lighting?: yes Regular seat belt use?: yes Hospital stays in the last year:: no  Cognitive  Assessment Difficulty concentrating, remembering, or making decisions? : no Will 6CIT or Mini Cog be Completed: no 6CIT or Mini Cog Declined: patient alert, oriented, able to answer questions appropriately and recall recent events  Advance Directives (For Healthcare) Does Patient Have a Medical Advance Directive?: No Would patient like information on creating a medical advance directive?: No - Patient declined  Reviewed/Updated  Reviewed/Updated: Reviewed All (Medical, Surgical, Family, Medications, Allergies, Care Teams, Patient Goals)    Allergies (verified) Lisinopril , Codeine, Penicillin g benzathine, and Percocet [oxycodone-acetaminophen ]   Current Medications (verified) Outpatient Encounter Medications as of 01/09/2025  Medication Sig   aspirin  81 MG tablet Take 81 mg by mouth daily.   atorvastatin  (LIPITOR) 40 MG tablet Take 1 tablet (40 mg total) by mouth daily.   buPROPion  (WELLBUTRIN  XL) 300 MG 24 hr tablet Take 1 tablet (300 mg total) by mouth daily.   divalproex  (DEPAKOTE  ER) 250 MG 24 hr tablet Take 1 tablet (250 mg total) by mouth 2 (two) times daily.   DULoxetine  (CYMBALTA ) 20 MG capsule Take 2 capsules (40 mg total) by mouth daily.   losartan  (COZAAR ) 50 MG tablet Take 2 tablets (100 mg total) by mouth daily.   omeprazole  (PRILOSEC) 40 MG capsule Take 1 capsule (40 mg total) by mouth daily.   risperiDONE  (RISPERDAL ) 1 MG tablet Take 2.5 tablets (2.5 mg total) by mouth at bedtime.   tamsulosin  (FLOMAX ) 0.4 MG CAPS capsule Take 1 capsule (0.4 mg total) by mouth daily after supper.   No facility-administered encounter medications on file as of 01/09/2025.    History: Past Medical History:  Diagnosis Date   Aortic insufficiency    a. 08/2020 Echo: EF 50-55%, bicuspid AoV, mild-mod AI; b. 07/2021 Echo:  EF 55-60%, no rwma, basal-septal hypertrophy, GrI DD, nl RV fxn, mild MR/AS, bicuspid AoV, mild-mod AI. Asc Ao 39mm.   Arthritis    Bicuspid aortic valve    Bipolar  disorder (HCC)    Cancer [facility]) October 2023   Prostrate   CVA (cerebral vascular accident) [facility])    Age 63 - no deficits   Depression    Dilated ascending aorta (HCC)    a. 07/2021 Echo: As Ao 39mm.   Diverticulitis of colon    GERD (gastroesophageal reflux disease)    History of cardiovascular stress test    a. 08/2020 MV: EF 61%, no ischemia/scar. Mild coronary artery and AoV Ca2+ noted.   Hyperlipidemia    Hypertension    Iron deficiency anemia    Past Surgical History:  Procedure Laterality Date   ESOPHAGOGASTRODUODENOSCOPY (EGD) WITH PROPOFOL  N/A 05/23/2016   Procedure: ESOPHAGOGASTRODUODENOSCOPY (EGD) WITH PROPOFOL  with dialation;  Surgeon: Rogelia Copping, MD;  Location: [facility];  Service: Endoscopy;  Laterality: N/A;   ESOPHAGOGASTRODUODENOSCOPY (EGD) WITH PROPOFOL  N/A 06/14/2019   Procedure: ESOPHAGOGASTRODUODENOSCOPY (EGD) WITH PROPOFOL ;  Surgeon: Copping Rogelia, MD;  Location: [facility];  Service: Endoscopy;  Laterality: N/A;   EYE SURGERY     Cataract surgery   HERNIA REPAIR     ORTHOPEDIC SURGERY     Surgery for broken leg and toe   Family History  Problem Relation Age of Onset   Stroke Father    Diabetes Brother    Cancer Brother    Social History   Occupational History   Occupation: retired   Tobacco Use   Smoking status: Former    Current packs/day: 0.00    Types: Cigarettes    Quit date: 12/29/1978    Years since quitting: 46.0    Passive exposure: Past   Smokeless tobacco: Current    Types: Snuff  Vaping Use   Vaping status: Never Used  Substance and Sexual Activity   Alcohol use: Never   Drug use: Never   Sexual activity: Not Currently    Birth control/protection: None   Tobacco Counseling Ready to quit: Not Answered Counseling given: Not Answered  SDOH Screenings   Food Insecurity: No Food Insecurity (01/06/2025)  Housing: Unknown (01/06/2025)  Transportation Needs: No Transportation Needs (01/06/2025)  Utilities: Not At Risk  (01/09/2025)  Alcohol Screen: Low Risk (01/09/2025)  Depression (PHQ2-9): Low Risk (05/10/2024)  Financial Resource Strain: Low Risk (01/06/2025)  Physical Activity: Inactive (01/06/2025)  Social Connections: Socially Isolated (01/09/2025)  Stress: No Stress Concern Present (01/06/2025)  Tobacco Use: High Risk (01/09/2025)  Health Literacy: Adequate Health Literacy (01/09/2025)   See flowsheets for full screening details  Depression Screen PHQ 2 & 9 Depression Scale- Over the past 2 weeks, how often have you been bothered by any of the following problems? Little interest or pleasure in doing things: 0 Feeling down, depressed, or hopeless (PHQ Adolescent also includes...irritable): 0 PHQ-2 Total Score: 0 Trouble falling or staying asleep, or sleeping too much: 0 Feeling tired or having little energy: 0 Poor appetite or overeating (PHQ Adolescent also includes...weight loss): 0 Feeling bad about yourself - or that you are a failure or have let yourself or your family down: 0 Trouble concentrating on things, such as reading the newspaper or watching television (PHQ Adolescent also includes...like school work): 0 Moving or speaking so slowly that other people could have noticed. Or the opposite - being so fidgety or restless that you have been moving around a lot more than usual:  0 Thoughts that you would be better off dead, or of hurting yourself in some way: 0 PHQ-9 Total Score: 0     Goals Addressed   None          Objective:    Today's Vitals   01/09/25 1334  BP: 130/72  Pulse: 83  Temp: 97.8 F (36.6 C)  TempSrc: Oral  SpO2: 98%  Weight: 178 lb 9.6 oz (81 kg)  Height: 5' 4 (1.626 m)  PainSc: 0-No pain   Body mass index is 30.66 kg/m.  Hearing/Vision screen No results found. Immunizations and Health Maintenance Health Maintenance  Topic Date Due   Zoster Vaccines- Shingrix (1 of 2) Never done   Medicare Annual Wellness (AWV)  11/10/2024   COVID-19 Vaccine (5 - Pfizer  risk 2025-26 season) 06/01/2025   DTaP/Tdap/Td (3 - Td or Tdap) 08/15/2030   Pneumococcal Vaccine: 50+ Years  Completed   Influenza Vaccine  Completed   Hepatitis C Screening  Completed   Meningococcal B Vaccine  Aged Out   Colonoscopy  Discontinued        Assessment/Plan:  This is a routine wellness examination for Dean Larsen.  Patient Care Team: Vicci Duwaine SQUIBB, DO as PCP - General (Family Medicine) Darron Deatrice LABOR, MD as PCP - Cardiology (Cardiology) Francisca Redell BROCKS, MD as Consulting Physician (Urology) Lenn Aran, MD as Consulting Physician (Radiation Oncology)  I have personally reviewed and noted the following in the patients chart:   Medical and social history Use of alcohol, tobacco or illicit drugs  Current medications and supplements including opioid prescriptions. Functional ability and status Nutritional status Physical activity Advanced directives List of other physicians Hospitalizations, surgeries, and ER visits in previous 12 months Vitals Screenings to include cognitive, depression, and falls Referrals and appointments  Orders Placed This Encounter  Procedures   Comprehensive metabolic panel with GFR    Has the patient fasted?:   Yes   CBC with Differential/Platelet   Lipid Panel w/o Chol/HDL Ratio    Has the patient fasted?:   Yes   PSA   TSH   Microalbumin, Urine Waived   In addition, I have reviewed and discussed with patient certain preventive protocols, quality metrics, and best practice recommendations. A written personalized care plan for preventive services as well as general preventive health recommendations were provided to patient.   Duwaine Vicci, DO   01/09/2025   Return in about 6 months (around 07/09/2025).  After Visit Summary: (In Person-Printed) AVS printed and given to the patient   "

## 2025-01-09 NOTE — Assessment & Plan Note (Addendum)
 Not doing well. Will increase his risperidone  to 3mg  and recheck in 2-3 weeks. Warning signs to go to the ER discussed today.

## 2025-01-10 ENCOUNTER — Ambulatory Visit: Payer: Self-pay | Admitting: Family Medicine

## 2025-01-10 LAB — CBC WITH DIFFERENTIAL/PLATELET
Basophils Absolute: 0 x10E3/uL (ref 0.0–0.2)
Basos: 1 %
EOS (ABSOLUTE): 0.1 x10E3/uL (ref 0.0–0.4)
Eos: 3 %
Hematocrit: 36.2 % — ABNORMAL LOW (ref 37.5–51.0)
Hemoglobin: 11.6 g/dL — ABNORMAL LOW (ref 13.0–17.7)
Immature Grans (Abs): 0 x10E3/uL (ref 0.0–0.1)
Immature Granulocytes: 0 %
Lymphocytes Absolute: 0.9 x10E3/uL (ref 0.7–3.1)
Lymphs: 23 %
MCH: 30.4 pg (ref 26.6–33.0)
MCHC: 32 g/dL (ref 31.5–35.7)
MCV: 95 fL (ref 79–97)
Monocytes Absolute: 0.6 x10E3/uL (ref 0.1–0.9)
Monocytes: 14 %
Neutrophils Absolute: 2.4 x10E3/uL (ref 1.4–7.0)
Neutrophils: 58 %
Platelets: 163 x10E3/uL (ref 150–450)
RBC: 3.81 x10E6/uL — ABNORMAL LOW (ref 4.14–5.80)
RDW: 13.1 % (ref 11.6–15.4)
WBC: 4.1 x10E3/uL (ref 3.4–10.8)

## 2025-01-10 LAB — COMPREHENSIVE METABOLIC PANEL WITH GFR
ALT: 14 IU/L (ref 0–44)
AST: 17 IU/L (ref 0–40)
Albumin: 3.9 g/dL (ref 3.8–4.8)
Alkaline Phosphatase: 49 IU/L (ref 47–123)
BUN/Creatinine Ratio: 10 (ref 10–24)
BUN: 14 mg/dL (ref 8–27)
Bilirubin Total: 0.8 mg/dL (ref 0.0–1.2)
CO2: 24 mmol/L (ref 20–29)
Calcium: 9 mg/dL (ref 8.6–10.2)
Chloride: 106 mmol/L (ref 96–106)
Creatinine, Ser: 1.35 mg/dL — ABNORMAL HIGH (ref 0.76–1.27)
Globulin, Total: 2.4 g/dL (ref 1.5–4.5)
Glucose: 90 mg/dL (ref 70–99)
Potassium: 5.1 mmol/L (ref 3.5–5.2)
Sodium: 144 mmol/L (ref 134–144)
Total Protein: 6.3 g/dL (ref 6.0–8.5)
eGFR: 53 mL/min/1.73 — ABNORMAL LOW

## 2025-01-10 LAB — LIPID PANEL W/O CHOL/HDL RATIO
Cholesterol, Total: 154 mg/dL (ref 100–199)
HDL: 42 mg/dL
LDL Chol Calc (NIH): 85 mg/dL (ref 0–99)
Triglycerides: 152 mg/dL — ABNORMAL HIGH (ref 0–149)
VLDL Cholesterol Cal: 27 mg/dL (ref 5–40)

## 2025-01-10 LAB — TSH: TSH: 1.62 u[IU]/mL (ref 0.450–4.500)

## 2025-01-10 LAB — PSA: Prostate Specific Ag, Serum: <0.1 ng/mL (ref 0.0–4.0)

## 2025-01-26 ENCOUNTER — Ambulatory Visit: Admitting: Family Medicine

## 2025-02-02 ENCOUNTER — Other Ambulatory Visit: Payer: Self-pay | Admitting: Family Medicine

## 2025-02-03 NOTE — Telephone Encounter (Signed)
 Requested medications are due for refill today.  no  Requested medications are on the active medications list.  no  Last refill.   Future visit scheduled.   yes  Notes to clinic.  This dosage is discontinued. Pt now taking 3mg  . Refill/refusal not delegated.    Requested Prescriptions  Pending Prescriptions Disp Refills   risperiDONE  (RISPERDAL ) 1 MG tablet [Pharmacy Med Name: risperiDONE  1 MG Oral Tablet] 225 tablet 0    Sig: TAKE TWO AND ONE-HALF TABLETS BY MOUTH AT BEDTIME     Not Delegated - Psychiatry:  Antipsychotics - Second Generation (Atypical) - risperidone  Failed - 02/03/2025  1:59 PM      Failed - This refill cannot be delegated      Failed - Lipid Panel in normal range within the last 12 months    Cholesterol, Total  Date Value Ref Range Status  01/09/2025 154 100 - 199 mg/dL Final   Cholesterol Piccolo, Waived  Date Value Ref Range Status  10/16/2016 CANCELED      Comment:    Test not performed  Result canceled by the ancillary    LDL Chol Calc (NIH)  Date Value Ref Range Status  01/09/2025 85 0 - 99 mg/dL Final   HDL  Date Value Ref Range Status  01/09/2025 42 >39 mg/dL Final   Triglycerides  Date Value Ref Range Status  01/09/2025 152 (H) 0 - 149 mg/dL Final   Triglycerides Piccolo,Waived  Date Value Ref Range Status  10/16/2016 CANCELED      Comment:    Test not performed  Result canceled by the ancillary          Passed - TSH in normal range and within 360 days    TSH  Date Value Ref Range Status  01/09/2025 1.620 0.450 - 4.500 uIU/mL Final         Passed - Completed PHQ-2 or PHQ-9 in the last 360 days      Passed - Last BP in normal range    BP Readings from Last 1 Encounters:  01/09/25 130/72         Passed - Last Heart Rate in normal range    Pulse Readings from Last 1 Encounters:  01/09/25 83         Passed - Valid encounter within last 6 months    Recent Outpatient Visits           3 weeks ago Encounter for annual  wellness visit (AWV) in Medicare patient   [facility] [facility] [place], Megan P, DO   6 months ago Acute pain of right knee   [facility] [place] [facility] [place], Megan P, DO   8 months ago Hypertensive kidney disease with stage 3 chronic kidney disease, unspecified whether stage 3a or 3b CKD (HCC)    [facility] [place], Megan P, DO              Passed - CBC within normal limits and completed in the last 12 months    WBC  Date Value Ref Range Status  01/09/2025 4.1 3.4 - 10.8 x10E3/uL Final  01/27/2023 2.8 (L) 4.0 - 10.5 K/uL Final   RBC  Date Value Ref Range Status  01/09/2025 3.81 (L) 4.14 - 5.80 x10E6/uL Final  01/27/2023 3.59 (L) 4.22 - 5.81 MIL/uL Final   Hemoglobin  Date Value Ref Range Status  01/09/2025 11.6 (L) 13.0 - 17.7 g/dL Final   Hematocrit  Date Value Ref Range  Status  01/09/2025 36.2 (L) 37.5 - 51.0 % Final   MCHC  Date Value Ref Range Status  01/09/2025 32.0 31.5 - 35.7 g/dL Final  98/69/7975 67.6 30.0 - 36.0 g/dL Final   [facility]  Date Value Ref Range Status  01/09/2025 30.4 26.6 - 33.0 pg Final  01/27/2023 30.6 26.0 - 34.0 pg Final   MCV  Date Value Ref Range Status  01/09/2025 95 79 - 97 fL Final   No results found for: PLTCOUNTKUC, LABPLAT, POCPLA RDW  Date Value Ref Range Status  01/09/2025 13.1 11.6 - 15.4 % Final         Passed - CMP within normal limits and completed in the last 12 months    Albumin  Date Value Ref Range Status  01/09/2025 3.9 3.8 - 4.8 g/dL Final   Alkaline Phosphatase  Date Value Ref Range Status  01/09/2025 49 47 - 123 IU/L Final   ALT  Date Value Ref Range Status  01/09/2025 14 0 - 44 IU/L Final   ALT (SGPT) Piccolo, Waived  Date Value Ref Range Status  10/16/2016 26 10 - 47 U/L Final   AST  Date Value Ref Range Status  01/09/2025 17 0 - 40 IU/L Final   AST (SGOT) Piccolo, Waived  Date Value Ref Range Status  10/16/2016 23 11 - 38 U/L Final    BUN  Date Value Ref Range Status  01/09/2025 14 8 - 27 mg/dL Final   Calcium   Date Value Ref Range Status  01/09/2025 9.0 8.6 - 10.2 mg/dL Final   CO2  Date Value Ref Range Status  01/09/2025 24 20 - 29 mmol/L Final   Creatinine, Ser  Date Value Ref Range Status  01/09/2025 1.35 (H) 0.76 - 1.27 mg/dL Final   Glucose  Date Value Ref Range Status  01/09/2025 90 70 - 99 mg/dL Final   Glucose, Bld  Date Value Ref Range Status  08/08/2021 117 (H) 70 - 99 mg/dL Final    Comment:    Glucose reference range applies only to samples taken after fasting for at least 8 hours.   Glucose-Capillary  Date Value Ref Range Status  08/08/2021 115 (H) 70 - 99 mg/dL Final    Comment:    Glucose reference range applies only to samples taken after fasting for at least 8 hours.   Potassium  Date Value Ref Range Status  01/09/2025 5.1 3.5 - 5.2 mmol/L Final   Sodium  Date Value Ref Range Status  01/09/2025 144 134 - 144 mmol/L Final   Bilirubin Total  Date Value Ref Range Status  01/09/2025 0.8 0.0 - 1.2 mg/dL Final   Protein, ur  Date Value Ref Range Status  08/07/2021 NEGATIVE NEGATIVE mg/dL Final   Protein,UA  Date Value Ref Range Status  05/11/2023 Negative Negative/Trace Final   Total Protein  Date Value Ref Range Status  01/09/2025 6.3 6.0 - 8.5 g/dL Final   GFR calc Af Amer  Date Value Ref Range Status  10/15/2020 57 (L) >59 mL/min/1.73 Final    Comment:    **In accordance with recommendations from the NKF-ASN Task force,**   [facility] is in the process of updating its eGFR calculation to the   2021 CKD-EPI creatinine equation that estimates kidney function   without a race variable.    eGFR  Date Value Ref Range Status  01/09/2025 53 (L) >59 mL/min/1.73 Final   GFR, Estimated  Date Value Ref Range Status  08/08/2021 54 (L) >60 mL/min Final  Comment:    (NOTE) Calculated using the CKD-EPI Creatinine Equation (2021)

## 2025-02-07 ENCOUNTER — Ambulatory Visit: Admitting: Family Medicine

## 2025-03-07 ENCOUNTER — Ambulatory Visit: Admitting: Family Medicine

## 2025-06-29 ENCOUNTER — Other Ambulatory Visit

## 2025-07-06 ENCOUNTER — Ambulatory Visit: Admitting: Radiation Oncology
